# Patient Record
Sex: Female | Born: 1955
Health system: Southern US, Community
[De-identification: ages and names within clinical notes are randomized; demographics above are authoritative.]

## PROBLEM LIST (undated history)

## (undated) DIAGNOSIS — E1169 Type 2 diabetes mellitus with other specified complication: Secondary | ICD-10-CM

## (undated) DIAGNOSIS — K219 Gastro-esophageal reflux disease without esophagitis: Secondary | ICD-10-CM

## (undated) DIAGNOSIS — M1990 Unspecified osteoarthritis, unspecified site: Secondary | ICD-10-CM

## (undated) DIAGNOSIS — M810 Age-related osteoporosis without current pathological fracture: Secondary | ICD-10-CM

## (undated) DIAGNOSIS — I739 Peripheral vascular disease, unspecified: Secondary | ICD-10-CM

## (undated) DIAGNOSIS — K7581 Nonalcoholic steatohepatitis (NASH): Secondary | ICD-10-CM

## (undated) DIAGNOSIS — F32A Depression, unspecified: Secondary | ICD-10-CM

## (undated) DIAGNOSIS — I700 Atherosclerosis of aorta: Secondary | ICD-10-CM

## (undated) DIAGNOSIS — C210 Malignant neoplasm of anus, unspecified: Secondary | ICD-10-CM

## (undated) DIAGNOSIS — I10 Essential (primary) hypertension: Secondary | ICD-10-CM

## (undated) DIAGNOSIS — N816 Rectocele: Secondary | ICD-10-CM

## (undated) DIAGNOSIS — E119 Type 2 diabetes mellitus without complications: Secondary | ICD-10-CM

## (undated) DIAGNOSIS — F419 Anxiety disorder, unspecified: Secondary | ICD-10-CM

## (undated) DIAGNOSIS — Z87442 Personal history of urinary calculi: Secondary | ICD-10-CM

## (undated) DIAGNOSIS — M26629 Arthralgia of temporomandibular joint, unspecified side: Secondary | ICD-10-CM

## (undated) DIAGNOSIS — E213 Hyperparathyroidism, unspecified: Secondary | ICD-10-CM

## (undated) DIAGNOSIS — H903 Sensorineural hearing loss, bilateral: Secondary | ICD-10-CM

## (undated) HISTORY — DX: Hyperparathyroidism, unspecified: E21.3

## (undated) HISTORY — DX: Unspecified osteoarthritis, unspecified site: M19.90

## (undated) HISTORY — DX: Essential (primary) hypertension: I10

## (undated) HISTORY — DX: Arthralgia of temporomandibular joint, unspecified side: M26.629

## (undated) HISTORY — DX: Type 2 diabetes mellitus without complications: E11.9

## (undated) HISTORY — DX: Rectocele: N81.6

## (undated) HISTORY — DX: Atherosclerosis of aorta: I70.0

## (undated) HISTORY — DX: Nonalcoholic steatohepatitis (NASH): K75.81

## (undated) HISTORY — PX: THERAPEUTIC ABORTION: SHX798

## (undated) HISTORY — DX: Age-related osteoporosis without current pathological fracture: M81.0

## (undated) HISTORY — DX: Malignant neoplasm of anus, unspecified: C21.0

## (undated) HISTORY — DX: Sensorineural hearing loss, bilateral: H90.3

## (undated) HISTORY — DX: Hyperlipidemia, unspecified: E11.69

## (undated) HISTORY — PX: LYMPH NODE BIOPSY: SHX201

---

## 1998-03-03 ENCOUNTER — Other Ambulatory Visit: Admission: RE | Admit: 1998-03-03 | Discharge: 1998-03-03 | Payer: Self-pay | Admitting: Obstetrics and Gynecology

## 1998-11-05 HISTORY — PX: PARATHYROIDECTOMY: SHX19

## 1999-01-30 ENCOUNTER — Encounter: Payer: Self-pay | Admitting: Family Medicine

## 1999-01-30 ENCOUNTER — Ambulatory Visit (HOSPITAL_COMMUNITY): Admission: RE | Admit: 1999-01-30 | Discharge: 1999-01-30 | Payer: Self-pay | Admitting: Family Medicine

## 1999-03-06 ENCOUNTER — Other Ambulatory Visit: Admission: RE | Admit: 1999-03-06 | Discharge: 1999-03-06 | Payer: Self-pay | Admitting: Obstetrics and Gynecology

## 1999-07-11 ENCOUNTER — Ambulatory Visit (HOSPITAL_COMMUNITY): Admission: RE | Admit: 1999-07-11 | Discharge: 1999-07-11 | Payer: Self-pay

## 2000-03-12 ENCOUNTER — Other Ambulatory Visit: Admission: RE | Admit: 2000-03-12 | Discharge: 2000-03-12 | Payer: Self-pay | Admitting: Obstetrics and Gynecology

## 2001-03-25 ENCOUNTER — Other Ambulatory Visit: Admission: RE | Admit: 2001-03-25 | Discharge: 2001-03-25 | Payer: Self-pay | Admitting: Obstetrics and Gynecology

## 2002-01-19 ENCOUNTER — Ambulatory Visit (HOSPITAL_COMMUNITY): Admission: RE | Admit: 2002-01-19 | Discharge: 2002-01-19 | Payer: Self-pay | Admitting: Family Medicine

## 2002-01-19 ENCOUNTER — Encounter: Payer: Self-pay | Admitting: Family Medicine

## 2002-03-25 ENCOUNTER — Other Ambulatory Visit: Admission: RE | Admit: 2002-03-25 | Discharge: 2002-03-25 | Payer: Self-pay | Admitting: Obstetrics and Gynecology

## 2003-03-29 ENCOUNTER — Other Ambulatory Visit: Admission: RE | Admit: 2003-03-29 | Discharge: 2003-03-29 | Payer: Self-pay | Admitting: Obstetrics and Gynecology

## 2004-04-05 ENCOUNTER — Other Ambulatory Visit: Admission: RE | Admit: 2004-04-05 | Discharge: 2004-04-05 | Payer: Self-pay | Admitting: Obstetrics and Gynecology

## 2005-04-06 ENCOUNTER — Other Ambulatory Visit: Admission: RE | Admit: 2005-04-06 | Discharge: 2005-04-06 | Payer: Self-pay | Admitting: Addiction Medicine

## 2006-04-16 ENCOUNTER — Other Ambulatory Visit: Admission: RE | Admit: 2006-04-16 | Discharge: 2006-04-16 | Payer: Self-pay | Admitting: Obstetrics and Gynecology

## 2007-04-18 ENCOUNTER — Other Ambulatory Visit: Admission: RE | Admit: 2007-04-18 | Discharge: 2007-04-18 | Payer: Self-pay | Admitting: Obstetrics and Gynecology

## 2008-04-20 ENCOUNTER — Other Ambulatory Visit: Admission: RE | Admit: 2008-04-20 | Discharge: 2008-04-20 | Payer: Self-pay | Admitting: Obstetrics and Gynecology

## 2009-09-01 ENCOUNTER — Ambulatory Visit: Payer: Self-pay | Admitting: Obstetrics and Gynecology

## 2009-09-01 ENCOUNTER — Encounter: Payer: Self-pay | Admitting: Obstetrics and Gynecology

## 2009-09-01 ENCOUNTER — Other Ambulatory Visit: Admission: RE | Admit: 2009-09-01 | Discharge: 2009-09-01 | Payer: Self-pay | Admitting: Obstetrics and Gynecology

## 2010-11-05 HISTORY — PX: VULVA /PERINEUM BIOPSY: SHX319

## 2013-03-11 ENCOUNTER — Encounter: Payer: Self-pay | Admitting: Obstetrics & Gynecology

## 2013-03-12 ENCOUNTER — Ambulatory Visit (INDEPENDENT_AMBULATORY_CARE_PROVIDER_SITE_OTHER): Payer: Managed Care, Other (non HMO) | Admitting: Obstetrics & Gynecology

## 2013-03-12 ENCOUNTER — Encounter: Payer: Self-pay | Admitting: Obstetrics & Gynecology

## 2013-03-12 VITALS — BP 138/80 | Ht 61.75 in | Wt 168.0 lb

## 2013-03-12 DIAGNOSIS — Z0000 Encounter for general adult medical examination without abnormal findings: Secondary | ICD-10-CM

## 2013-03-12 DIAGNOSIS — Z01419 Encounter for gynecological examination (general) (routine) without abnormal findings: Secondary | ICD-10-CM

## 2013-03-12 DIAGNOSIS — N6459 Other signs and symptoms in breast: Secondary | ICD-10-CM

## 2013-03-12 LAB — POCT URINALYSIS DIPSTICK
Bilirubin, UA: NEGATIVE
Glucose, UA: NEGATIVE
Ketones, UA: NEGATIVE
Nitrite, UA: NEGATIVE
Protein, UA: NEGATIVE
Urobilinogen, UA: NEGATIVE
pH, UA: 5

## 2013-03-12 MED ORDER — VALACYCLOVIR HCL 500 MG PO TABS: 500.0000 mg | ORAL_TABLET | Freq: Two times a day (BID) | ORAL | Status: DC

## 2013-03-12 MED ORDER — CLOBETASOL PROPIONATE 0.05 % EX OINT: TOPICAL_OINTMENT | Freq: Two times a day (BID) | CUTANEOUS | Status: DC

## 2013-03-12 MED ORDER — ESTRADIOL 0.1 MG/GM VA CREA: TOPICAL_CREAM | VAGINAL | Status: DC

## 2013-03-12 NOTE — Patient Instructions (Signed)

## 2013-03-12 NOTE — Progress Notes (Signed)
57 y.o. [id number] MarriedCaucasianF here for annual exam.  Doing well.  Has had two outbreaks of herpes.  Has not had colonoscopy. Willing to go.  Complaint of significant vaginal dryness--affecting sexual activity with husband.  Would like suggestions.   Patient's last menstrual period was 11/05/2002.          Sexually active: yes  The current method of family planning is vasectomy.    Exercising: no  not regularly Smoker:  no  Health Maintenance: Pap:  02/13/12 WNL/negative HR HPV MMG:  06/16/12 normal Colonoscopy:  none BMD:   5/12-osteoporsis TDaP:  Up to date Labs: 1/14 with PCP, all normal URINE: wbc +2, rbc +1   HGB:13.6   reports that she has never smoked. She has never used smokeless tobacco. She reports that  drinks alcohol. She reports that she does not use illicit drugs.  Past Medical History  Diagnosis Date  . Hyperparathyroidism     resolved after surgery  . Osteoporosis     of the spine  . Hypertension   . Rectocele   . TMJ syndrome     Past Surgical History  Procedure Laterality Date  . Vulva /perineum biopsy  3/12  . Parathyroidectomy      Current Outpatient Prescriptions  Medication Sig Dispense Refill  . aspirin 81 MG tablet Take 81 mg by mouth daily.      Marland Kitchen atenolol (TENORMIN) 50 MG tablet Take 50 mg by mouth daily.      . Calcium Carbonate-Vitamin D (CALCIUM + D PO) Take 500 mg by mouth daily.      . Cholecalciferol (VITAMIN D PO) Take 5,000 Int'l Units by mouth daily.      . clobetasol ointment (TEMOVATE) 0.05 % Apply topically. Apply BID up to & days with flare up      . Omega-3 Fatty Acids (FISH OIL PO) Take 300 mg by mouth daily.      . vitamin C (ASCORBIC ACID) 500 MG tablet Take 500 mg by mouth daily.       No current facility-administered medications for this visit.    Family History  Problem Relation Age of Onset  . Cancer Mother     uterine  . Breast cancer Maternal Aunt   . Breast cancer Paternal Aunt   . Breast cancer Paternal Aunt      ROS:  Pertinent items are noted in HPI.  Otherwise, a comprehensive ROS was negative.  Exam:   BP 138/80  Ht 5' 1.75" (1.568 m)  Wt 168 lb (76.204 kg)  BMI 30.99 kg/m2  LMP 11/05/2002   Height: 5' 1.75" (156.8 cm)  Ht Readings from Last 3 Encounters:  03/12/13 5' 1.75" (1.568 m)    General appearance: alert, cooperative and appears stated age Head: Normocephalic, without obvious abnormality, atraumatic Neck: no adenopathy, supple, symmetrical, trachea midline and thyroid normal to inspection and palpation Lungs: clear to auscultation bilaterally Breasts: normal appearance, no masses or tenderness, on right but on left there is thickening above areola around 12 o'clock Heart: regular rate and rhythm Abdomen: soft, non-tender; bowel sounds normal; no masses,  no organomegaly Extremities: extremities normal, atraumatic, no cyanosis or edema Skin: Skin color, texture, turgor normal. No rashes or lesions Lymph nodes: Cervical, supraclavicular, and axillary nodes normal. No abnormal inguinal nodes palpated Neurologic: Grossly normal   Pelvic: External genitalia:  no lesions              Urethra:  normal appearing urethra with no masses, tenderness or  lesions              Bartholins and Skenes: normal                 Vagina: normal appearing vagina with normal color and discharge, no lesions, atrophic and erythematous               Cervix: no lesions              Pap taken: no Bimanual Exam:  Uterus:  normal size, contour, position, consistency, mobility, non-tender              Adnexa: no mass, fullness, tenderness               Rectovaginal: Confirms               Anus:  normal sphincter tone, no lesions  A:  Well Woman with normal exam PMP, no HRT Vaginal atrophic changes Hypertension HSV LS&A  P:   Mammogram yearly.  Diagnostic left MMG for findings today. Valtrex 500mg  qd for suppression or bid x 3 days with outbreaks.  Pt will start with using for outbreaks but if  has another one this year, will use as suppression.  Will call if needs RX. Clobetasol rx given for prn use Trial of estrace cream.  Pt will give update 6-8 weeks. No Pap today.  Neg with neg HR HPV 4/13 BMD next year return annually or prn  An After Visit Summary was printed and given to the patient.

## 2013-03-13 LAB — HEMOGLOBIN, FINGERSTICK: Hemoglobin, fingerstick: 13.6 g/dL (ref 12.0–16.0)

## 2013-03-19 ENCOUNTER — Telehealth: Payer: Self-pay | Admitting: Orthopedic Surgery

## 2013-03-19 NOTE — Telephone Encounter (Signed)
LM on pt's VM about appt with Dr. Loreta Ave for consultation 03-24-13 at 3:15. Phone [phone] if need to reschedule.

## 2013-04-03 ENCOUNTER — Telehealth: Payer: Self-pay | Admitting: *Deleted

## 2013-04-03 NOTE — Telephone Encounter (Signed)
LEFT MESSAGE ON HOME # OF NEED TO RETURN CALL FOR MAMMOGRAM RESULTS PER DR. MILLER. PLACED IN RECALL TO RECHECK BREAST PER DR. MILLER. SUE

## 2013-05-27 ENCOUNTER — Other Ambulatory Visit: Payer: Self-pay | Admitting: Obstetrics & Gynecology

## 2013-05-27 MED ORDER — ESTRADIOL 0.1 MG/GM VA CREA: TOPICAL_CREAM | VAGINAL | Status: DC

## 2013-05-27 NOTE — Telephone Encounter (Signed)
Pt states dryness is better and she is noticing improvement.  Pt was out of town and missed one week of therapy.  Pt would like RX for ESTRACE CREAM.

## 2013-05-27 NOTE — Telephone Encounter (Signed)
Patient was given samples of Estrace and it has helped . If she wants me to continue using this, she needs to call in Prescription. Please .

## 2013-11-05 DIAGNOSIS — E119 Type 2 diabetes mellitus without complications: Secondary | ICD-10-CM

## 2013-11-05 HISTORY — DX: Type 2 diabetes mellitus without complications: E11.9

## 2014-01-14 ENCOUNTER — Telehealth: Payer: Self-pay

## 2014-01-14 NOTE — Telephone Encounter (Signed)
lmtcb to discuss BMD//kn

## 2014-03-16 ENCOUNTER — Ambulatory Visit: Payer: Managed Care, Other (non HMO) | Admitting: Obstetrics & Gynecology

## 2014-04-22 ENCOUNTER — Ambulatory Visit (INDEPENDENT_AMBULATORY_CARE_PROVIDER_SITE_OTHER): Payer: Managed Care, Other (non HMO) | Admitting: Obstetrics & Gynecology

## 2014-04-22 ENCOUNTER — Encounter: Payer: Self-pay | Admitting: Obstetrics & Gynecology

## 2014-04-22 VITALS — BP 122/76 | HR 68 | Resp 16 | Ht 61.75 in | Wt 166.0 lb

## 2014-04-22 DIAGNOSIS — Z01419 Encounter for gynecological examination (general) (routine) without abnormal findings: Secondary | ICD-10-CM

## 2014-04-22 DIAGNOSIS — Z0000 Encounter for general adult medical examination without abnormal findings: Secondary | ICD-10-CM

## 2014-04-22 DIAGNOSIS — Z124 Encounter for screening for malignant neoplasm of cervix: Secondary | ICD-10-CM

## 2014-04-22 DIAGNOSIS — Z1211 Encounter for screening for malignant neoplasm of colon: Secondary | ICD-10-CM

## 2014-04-22 LAB — POCT URINALYSIS DIPSTICK
BILIRUBIN UA: NEGATIVE
GLUCOSE UA: NEGATIVE
KETONES UA: NEGATIVE
NITRITE UA: NEGATIVE
PH UA: 5
Protein, UA: NEGATIVE
Urobilinogen, UA: NEGATIVE

## 2014-04-22 LAB — HEMOGLOBIN, FINGERSTICK: Hemoglobin, fingerstick: 14.1 g/dL (ref 12.0–16.0)

## 2014-04-22 MED ORDER — CLOBETASOL PROPIONATE 0.05 % EX OINT: TOPICAL_OINTMENT | CUTANEOUS | Status: DC

## 2014-04-22 MED ORDER — VALACYCLOVIR HCL 500 MG PO TABS: 500.0000 mg | ORAL_TABLET | Freq: Two times a day (BID) | ORAL | Status: DC

## 2014-04-22 MED ORDER — ESTRADIOL 0.1 MG/GM VA CREA: TOPICAL_CREAM | VAGINAL | Status: DC

## 2014-04-22 NOTE — Progress Notes (Signed)
Patient ID: Angelica Wright, female   DOB: [birth year], 57 y.o.   MRN: [id number]  58 y.o. [id number] MarriedCaucasianF here for annual exam.  Reviewed BMD with pt today.  Osteoporosis in spine with 2/15 BMD.  T score -2.6.  Was -2.7 5/12.  Hips with small amount of change <1% and 3% but still mild osteopenia.    No vaginal bleeding.  Having some yellowish discharge.  Tried estrace cream last year.  Had some benefit.  Would like to start.    Last blood work was in January.  Diagnosed with "pre-diabetic" at that time.  Has follow up at the end of the summer.   Patient's last menstrual period was 11/05/2002.          Sexually active: yes  The current method of family planning is post menopausal status.    Exercising: yes  pt exercises when able, depending on knees Smoker:  no  Health Maintenance: Pap:  02/13/12, WNL, neg HR HPV History of abnormal Pap:  no MMG:  07/28/13, benign--follow up from May MMG.  Back on routine screening now.   Colonoscopy:  none BMD:   12/15/13, Osteoporosis TDaP:  UTD Screening Labs: 2015 with PCP Hb today: 14.1, Urine today: WBC-1+, RBC-1+   reports that she has never smoked. She has never used smokeless tobacco. She reports that she drinks alcohol. She reports that she does not use illicit drugs.  Past Medical History  Diagnosis Date  . Hyperparathyroidism     resolved after surgery  . Osteoporosis     of the spine  . Hypertension   . Rectocele   . TMJ syndrome     Past Surgical History  Procedure Laterality Date  . Vulva /perineum biopsy  3/12  . Parathyroidectomy    . Therapeutic abortion      x 2    Current Outpatient Prescriptions  Medication Sig Dispense Refill  . aspirin 81 MG tablet Take 81 mg by mouth daily.      Marland Kitchen atenolol (TENORMIN) 50 MG tablet Take 50 mg by mouth daily.      . Calcium Carbonate-Vitamin D (CALCIUM + D PO) Take 500 mg by mouth daily.      . Cholecalciferol (VITAMIN D PO) Take 5,000 Int'l Units by mouth daily.      .  clobetasol ointment (TEMOVATE) 0.05 % Apply topically 2 (two) times daily. Apply BID up to & days with flare up  60 g  2  . estradiol (ESTRACE) 0.1 MG/GM vaginal cream 1/2 gram pv twice weekly  42.5 g  0  . Omega-3 Fatty Acids (FISH OIL PO) Take 300 mg by mouth daily.      . valACYclovir (VALTREX) 500 MG tablet Take 1 tablet (500 mg total) by mouth 2 (two) times daily. Take for 3 days with flares.  If decides to start suppression--one tablet daily  30 tablet  2  . vitamin C (ASCORBIC ACID) 500 MG tablet Take 500 mg by mouth daily.       No current facility-administered medications for this visit.    Family History  Problem Relation Age of Onset  . Cancer Mother     uterine  . Breast cancer Maternal Aunt   . Breast cancer Paternal Aunt   . Breast cancer Paternal Aunt     ROS:  Pertinent items are noted in HPI.  Otherwise, a comprehensive ROS was negative.  Exam:   LMP 11/05/2002    Ht Readings from Last 3 Encounters:  03/12/13 5' 1.75" (1.568 m)    General appearance: alert, cooperative and appears stated age Head: Normocephalic, without obvious abnormality, atraumatic Neck: no adenopathy, supple, symmetrical, trachea midline and thyroid normal to inspection and palpation Lungs: clear to auscultation bilaterally Breasts: normal appearance, no masses or tenderness Heart: regular rate and rhythm Abdomen: soft, non-tender; bowel sounds normal; no masses,  no organomegaly Extremities: extremities normal, atraumatic, no cyanosis or edema Skin: Skin color, texture, turgor normal. No rashes or lesions Lymph nodes: Cervical, supraclavicular, and axillary nodes normal. No abnormal inguinal nodes palpated Neurologic: Grossly normal   Pelvic: External genitalia:  no lesions              Urethra:  normal appearing urethra with no masses, tenderness or lesions              Bartholins and Skenes: normal                 Vagina: normal appearing vagina with normal color and discharge, no  lesions              Cervix: no lesions              Pap taken: yes Bimanual Exam:  Uterus:  normal size, contour, position, consistency, mobility, non-tender              Adnexa: normal adnexa and no mass, fullness, tenderness               Rectovaginal: Confirms               Anus:  normal sphincter tone, no lesions  A:  Well Woman with normal exam  PMP, no HRT  Osteopenia Vaginal atrophic changes  Hypertension  HSV  LS&A  P:   Mammogram yearly.  Pt already has appt scheduled in September. Valtrex 500mg  qd for suppression or bid x 3 days with outbreaks. Pt will start with using for outbreaks but if has another one this year, will use as suppression.  Rx to pharmacy. Clobetasol rx given for prn use  Continue/restart Estrace cream 1gm pv up to twice weekly.  Rx to pharmacy. Pap today. Neg with neg HR HPV 4/13  Repeat BMD in 3-5 years.  Needs colon cancer screening.  Declines colonoscopy.  IFOB given. return annually or prn   An After Visit Summary was printed and given to the patient.

## 2014-04-22 NOTE — Telephone Encounter (Signed)
Patient notified of BMD results.//kn 

## 2014-04-28 NOTE — Addendum Note (Signed)
Addended by: Megan Salon on: 04/28/2014 10:51 AM   Modules accepted: Orders

## 2014-04-30 LAB — IPS PAP SMEAR ONLY

## 2014-05-17 ENCOUNTER — Encounter: Payer: Self-pay | Admitting: Obstetrics & Gynecology

## 2014-05-17 NOTE — Progress Notes (Signed)
Pt has not return ifob kit.  Letter sent.

## 2014-09-06 ENCOUNTER — Encounter: Payer: Self-pay | Admitting: Obstetrics & Gynecology

## 2015-05-06 ENCOUNTER — Ambulatory Visit: Payer: Managed Care, Other (non HMO) | Admitting: Obstetrics & Gynecology

## 2015-07-06 ENCOUNTER — Ambulatory Visit (INDEPENDENT_AMBULATORY_CARE_PROVIDER_SITE_OTHER): Payer: Managed Care, Other (non HMO) | Admitting: Nurse Practitioner

## 2015-07-06 ENCOUNTER — Encounter: Payer: Self-pay | Admitting: Nurse Practitioner

## 2015-07-06 VITALS — BP 110/74 | HR 68 | Ht 61.25 in | Wt 166.0 lb

## 2015-07-06 DIAGNOSIS — N904 Leukoplakia of vulva: Secondary | ICD-10-CM

## 2015-07-06 DIAGNOSIS — Z1211 Encounter for screening for malignant neoplasm of colon: Secondary | ICD-10-CM | POA: Diagnosis not present

## 2015-07-06 DIAGNOSIS — M8580 Other specified disorders of bone density and structure, unspecified site: Secondary | ICD-10-CM

## 2015-07-06 DIAGNOSIS — Z01419 Encounter for gynecological examination (general) (routine) without abnormal findings: Secondary | ICD-10-CM

## 2015-07-06 DIAGNOSIS — N952 Postmenopausal atrophic vaginitis: Secondary | ICD-10-CM | POA: Insufficient documentation

## 2015-07-06 DIAGNOSIS — I10 Essential (primary) hypertension: Secondary | ICD-10-CM | POA: Diagnosis not present

## 2015-07-06 DIAGNOSIS — Z0000 Encounter for general adult medical examination without abnormal findings: Secondary | ICD-10-CM

## 2015-07-06 MED ORDER — VALACYCLOVIR HCL 500 MG PO TABS: 500.0000 mg | ORAL_TABLET | Freq: Two times a day (BID) | ORAL | Status: DC

## 2015-07-06 MED ORDER — ESTRADIOL 10 MCG VA TABS: 1.0000 | ORAL_TABLET | VAGINAL | Status: DC

## 2015-07-06 MED ORDER — CLOBETASOL PROPIONATE 0.05 % EX OINT: TOPICAL_OINTMENT | CUTANEOUS | Status: DC

## 2015-07-06 NOTE — Progress Notes (Signed)
Encounter reviewed by Dr. Brook Amundson C. Silva.  

## 2015-07-06 NOTE — Patient Instructions (Signed)

## 2015-07-06 NOTE — Progress Notes (Signed)
Patient ID: Angelica Wright, female   DOB: 05/15/1956, 59 y.o.   MRN: [id number] 59 y.o. [id number] Married  Caucasian Fe here for annual exam.  Currently off vaginal estrogen for about a year. No an increase in dryness and dyspareunia. She wishes to try another type of estrogen.   Ok to try Vagifem.  Patient's last menstrual period was 11/05/2002 (approximate).          Sexually active: Yes.    The current method of family planning is vasectomy and post menopausal status.    Exercising: No.  The patient does not participate in regular exercise at present. Smoker:  no  Health Maintenance: Pap: 04/28/14, Negative  History of abnormal Pap: no MMG: 07/29/14, Bi-Rads 2:  Benign Findings Colonoscopy: none BMD: 12/15/13, -2.6 S / -1.3 R / -1.5 L TDaP: UTD Labs:  Dr. Kenton Kingfisher takes care of labs and urine.   reports that she has never smoked. She has never used smokeless tobacco. She reports that she drinks about 3.5 oz of alcohol per week. She reports that she does not use illicit drugs.  Past Medical History  Diagnosis Date  . Hyperparathyroidism     resolved after surgery  . Osteoporosis     of the spine  . Hypertension   . Rectocele   . TMJ syndrome   . Prediabetes 1/15    by PCP    Past Surgical History  Procedure Laterality Date  . Vulva /perineum biopsy  3/12  . Parathyroidectomy    . Therapeutic abortion      x 2    Current Outpatient Prescriptions  Medication Sig Dispense Refill  . aspirin 81 MG tablet Take 81 mg by mouth daily.     Marland Kitchen atenolol (TENORMIN) 50 MG tablet Take 50 mg by mouth daily.    . Calcium Carbonate-Vitamin D (CALCIUM + D PO) Take 500 mg by mouth daily.    . Cholecalciferol (VITAMIN D PO) Take 5,000 Int'l Units by mouth daily.    . clobetasol ointment (TEMOVATE) 0.05 % Apply BID up to & days with flare up 60 g 2  . Omega-3 Fatty Acids (FISH OIL PO) Take 300 mg by mouth daily.    . valACYclovir (VALTREX) 500 MG tablet Take 1 tablet (500 mg total) by  mouth 2 (two) times daily. Take for 3 days with flares.  If decides to start suppression--one tablet daily 90 tablet 3  . vitamin C (ASCORBIC ACID) 500 MG tablet Take 500 mg by mouth daily.    . Estradiol 10 MCG TABS vaginal tablet Place 1 tablet (10 mcg total) vaginally 2 (two) times a week. 8 tablet 11   No current facility-administered medications for this visit.    Family History  Problem Relation Age of Onset  . Cancer Mother     uterine  . Breast cancer Maternal Aunt   . Breast cancer Paternal Aunt   . Breast cancer Paternal Aunt     ROS:  Pertinent items are noted in HPI.  Otherwise, a comprehensive ROS was negative.  Exam:   BP 110/74 mmHg  Pulse 68  Ht 5' 1.25" (1.556 m)  Wt 166 lb (75.297 kg)  BMI 31.10 kg/m2  LMP 11/05/2002 (Approximate) Height: 5' 1.25" (155.6 cm) Ht Readings from Last 3 Encounters:  07/06/15 5' 1.25" (1.556 m)  04/22/14 5' 1.75" (1.568 m)  03/12/13 5' 1.75" (1.568 m)    General appearance: alert, cooperative and appears stated age Head: Normocephalic, without obvious abnormality, atraumatic  Neck: no adenopathy, supple, symmetrical, trachea midline and thyroid normal to inspection and palpation Lungs: clear to auscultation bilaterally Breasts: normal appearance, no masses or tenderness Heart: regular rate and rhythm Abdomen: soft, non-tender; no masses,  no organomegaly Extremities: extremities normal, atraumatic, no cyanosis or edema Skin: Skin color, texture, turgor normal. No rashes or lesions Lymph nodes: Cervical, supraclavicular, and axillary nodes normal. No abnormal inguinal nodes palpated Neurologic: Grossly normal   Pelvic: External genitalia:  no lesions, no flare of LSA              Urethra:  normal appearing urethra with no masses, tenderness or lesions              Bartholin's and Skene's: normal                 Vagina: atrophic appearing vagina with normal color and discharge, no lesions              Cervix: anteverted               Pap taken: No. Bimanual Exam:  Uterus:  normal size, contour, position, consistency, mobility, non-tender              Adnexa: no mass, fullness, tenderness               Rectovaginal: Confirms               Anus:  normal sphincter tone, no lesions  Chaperone present: no  A:  Well Woman with normal exam  Postmenopausal no HRT   Osteopenia  Vaginal atrophic changes with dyspareunia  Hypertension   HSV - suppressive therapy  LSA without current flare   P:   Reviewed health and wellness pertinent to exam  Pap smear as above  Mammogram is due 07/2015  Rx given for Vagifem 10 mcg daily for 7-10 days then twice a week X 1 year  Counseled with risk of DVT, CVA, cancer, etc.  She does not need a refill on Temovate at this time  Refill on Valtrex for a year  She has agreed on a referral for colonoscopy  Counseled on breast self exam, mammography screening, use and side effects of HRT, osteoporosis, adequate intake of calcium and vitamin D, diet and exercise, Kegel's exercises return annually or prn  An After Visit Summary was printed and given to the patient.

## 2015-07-18 ENCOUNTER — Telehealth: Payer: Self-pay | Admitting: Nurse Practitioner

## 2015-07-18 NOTE — Telephone Encounter (Signed)
Patient calling in regards to vagifem refill. Ms. Chong Sicilian wrote sent in a rx for Vagifem, patient says she was supposed to insert the vagifem daily for 2 weeks and the twice a week there after. Patient says the rx was not written correctly and she has run out, needs our office to do a override. Pharmacy: [facility] Best contact: Home # [phone] ; Cell [phone]

## 2015-07-18 NOTE — Telephone Encounter (Signed)
Angelica Boroughs, FNP patient was given Vagifem 10 mcg with instructions reading place one tablet vaginally twice per week #[id number]. Patient states she was advised to place one tablet daily for 2 weeks and then twice a week for one year. Patient was given 8 tablets and is now out. Pharmacy will not fill rx until the following month based on how it is written with her insurance. Angelica Boroughs, FNP please verify prescription directions. Patient will need new rx.

## 2015-07-19 ENCOUNTER — Other Ambulatory Visit: Payer: Self-pay | Admitting: Nurse Practitioner

## 2015-07-19 MED ORDER — ESTRADIOL 10 MCG VA TABS: 1.0000 | ORAL_TABLET | VAGINAL | Status: DC

## 2015-07-19 MED ORDER — ESTRADIOL 10 MCG VA TABS: ORAL_TABLET | VAGINAL | Status: DC

## 2015-07-19 NOTE — Progress Notes (Signed)
Prescription resent electronically with excess instruction removed.

## 2015-07-19 NOTE — Addendum Note (Signed)
Addended by: Graylon Good on: 07/19/2015 01:47 PM   Modules accepted: Orders

## 2015-07-19 NOTE — Telephone Encounter (Signed)
The medication is now entered correctly - my error in not picking the right sig. Sorry.

## 2015-07-19 NOTE — Telephone Encounter (Signed)
Spoke with patient. Advised new rx for Vagifem 10 mcg has been sent to pharmacy with new instructions and amount. Advised this should allow her to pick up her rx now that the changes have been made. Advised if she has any difficulty getting her prescription filled to give our office a call.  Routing to provider for final review. Patient agreeable to disposition. Will close encounter.

## 2015-07-21 ENCOUNTER — Telehealth: Payer: Self-pay | Admitting: Nurse Practitioner

## 2015-07-21 NOTE — Telephone Encounter (Signed)
Spoke with patient regarding referral appointment. The information is listed below. The patient is aware if she needs to cancel or reschedule this appointment, she may call the office they've been referred to in order to reschedule. Gave below information to patient: ok to close encounter  [facility] Dr Collene Mares [address] [phone]  08/04/15 @2pm . Arrive 145 with insurance card/photo id.

## 2016-08-06 ENCOUNTER — Ambulatory Visit: Payer: Managed Care, Other (non HMO) | Admitting: Nurse Practitioner

## 2016-08-28 ENCOUNTER — Encounter: Payer: Self-pay | Admitting: Nurse Practitioner

## 2016-08-28 ENCOUNTER — Ambulatory Visit (INDEPENDENT_AMBULATORY_CARE_PROVIDER_SITE_OTHER): Payer: Managed Care, Other (non HMO) | Admitting: Nurse Practitioner

## 2016-08-28 ENCOUNTER — Encounter: Payer: Self-pay | Admitting: Obstetrics & Gynecology

## 2016-08-28 VITALS — BP 140/86 | HR 94 | Resp 16 | Ht 61.0 in | Wt 167.0 lb

## 2016-08-28 DIAGNOSIS — Z01419 Encounter for gynecological examination (general) (routine) without abnormal findings: Secondary | ICD-10-CM

## 2016-08-28 DIAGNOSIS — N8189 Other female genital prolapse: Secondary | ICD-10-CM

## 2016-08-28 DIAGNOSIS — Z0000 Encounter for general adult medical examination without abnormal findings: Secondary | ICD-10-CM | POA: Diagnosis not present

## 2016-08-28 DIAGNOSIS — Z1211 Encounter for screening for malignant neoplasm of colon: Secondary | ICD-10-CM | POA: Diagnosis not present

## 2016-08-28 DIAGNOSIS — N904 Leukoplakia of vulva: Secondary | ICD-10-CM | POA: Diagnosis not present

## 2016-08-28 DIAGNOSIS — N952 Postmenopausal atrophic vaginitis: Secondary | ICD-10-CM

## 2016-08-28 MED ORDER — ESTRADIOL 10 MCG VA TABS: ORAL_TABLET | VAGINAL | 12 refills | Status: DC

## 2016-08-28 MED ORDER — CLOBETASOL PROPIONATE 0.05 % EX OINT: TOPICAL_OINTMENT | CUTANEOUS | 2 refills | Status: DC

## 2016-08-28 MED ORDER — VALACYCLOVIR HCL 500 MG PO TABS: 500.0000 mg | ORAL_TABLET | Freq: Two times a day (BID) | ORAL | 3 refills | Status: DC

## 2016-08-28 NOTE — Progress Notes (Signed)
60 y.o. [id number] Married  Caucasian Fe here for annual exam.  Moved mother her from [place] and she is now in an apartment.  No new diagnosis.  Patient's last menstrual period was 11/05/2002 (approximate).          Sexually active: Yes.    The current method of family planning is post menopausal status.    Exercising: No.  The patient does not participate in regular exercise at present. Smoker:  no  Health Maintenance: Pap: 04/28/14 Negative MMG: 08/02/16, Bi-Rads 2:  Benign Colonoscopy: declines -  IFOB is given BMD:   08/02/16 T Score: -2.60 Spine / -2.00 Right Femur Neck / -2.20 Left Femur Neck TDaP:  UTD with PCP Shingles: Not indicated due to age Pneumonia: Not indicated due to age Hep C and HIV: Done in September with PCP Labs: PCP   reports that she has never smoked. She has never used smokeless tobacco. She reports that she drinks about 3.5 oz of alcohol per week . She reports that she does not use drugs.  Past Medical History:  Diagnosis Date  . Hyperparathyroidism    resolved after surgery  . Hypertension   . Osteoporosis    of the spine  . Prediabetes 1/15   by PCP  . Rectocele   . TMJ syndrome     Past Surgical History:  Procedure Laterality Date  . PARATHYROIDECTOMY    . THERAPEUTIC ABORTION     x 2  . VULVA /PERINEUM BIOPSY  3/12    Current Outpatient Prescriptions  Medication Sig Dispense Refill  . aspirin 81 MG tablet Take 81 mg by mouth daily.     Marland Kitchen atenolol (TENORMIN) 50 MG tablet Take 50 mg by mouth daily.    . Calcium Carbonate-Vitamin D (CALCIUM + D PO) Take 500 mg by mouth daily.    . Cholecalciferol (VITAMIN D PO) Take 5,000 Int'l Units by mouth daily.    . clobetasol ointment (TEMOVATE) 0.05 % Apply BID up to & days with flare up 60 g 2  . Estradiol (VAGIFEM) 10 MCG TABS vaginal tablet Use nightly before bed for two weeks when first beginning this medicine,then begin using twice a week. 18 tablet 12  . Omega-3 Fatty Acids (FISH OIL PO) Take 300 mg by  mouth daily.    . valACYclovir (VALTREX) 500 MG tablet Take 1 tablet (500 mg total) by mouth 2 (two) times daily. Take for 3 days with flares.  If decides to start suppression--one tablet daily 90 tablet 3  . vitamin C (ASCORBIC ACID) 500 MG tablet Take 500 mg by mouth daily.     No current facility-administered medications for this visit.     Family History  Problem Relation Age of Onset  . Cancer Mother     uterine  . Breast cancer Maternal Aunt   . Breast cancer Paternal Aunt   . Breast cancer Paternal Aunt     ROS:  Pertinent items are noted in HPI.  Otherwise, a comprehensive ROS was negative.  Exam:   BP 140/86 (BP Location: Right Arm, Patient Position: Sitting, Cuff Size: Normal)   Pulse 94   Resp 16   Ht 5\' 1"  (1.549 m)   Wt 167 lb (75.8 kg)   LMP 11/05/2002 (Approximate)   BMI 31.55 kg/m  Height: 5\' 1"  (154.9 cm) Ht Readings from Last 3 Encounters:  08/28/16 5\' 1"  (1.549 m)  07/06/15 5' 1.25" (1.556 m)  04/22/14 5' 1.75" (1.568 m)    General  appearance: alert, cooperative and appears stated age Head: Normocephalic, without obvious abnormality, atraumatic Neck: no adenopathy, supple, symmetrical, trachea midline and thyroid normal to inspection and palpation Lungs: clear to auscultation bilaterally Breasts: normal appearance, no masses or tenderness Heart: regular rate and rhythm Abdomen: soft, non-tender; no masses,  no organomegaly Extremities: extremities normal, atraumatic, no cyanosis or edema Skin: Skin color, texture, turgor normal. No rashes or lesions Lymph nodes: Cervical, supraclavicular, and axillary nodes normal. No abnormal inguinal nodes palpated Neurologic: Grossly normal   Pelvic: External genitalia:  no lesions              Urethra:  normal appearing urethra with no masses, tenderness or lesions              Bartholin's and Skene's: normal                 Vagina: atrophic appearing vagina with normal color and discharge, no lesions with  pelvic floor relaxation              Cervix: anteverted              Pap taken: No. Bimanual Exam:  Uterus:  normal size, contour, position, consistency, mobility, non-tender              Adnexa: no mass, fullness, tenderness               Rectovaginal: Confirms               Anus:  normal sphincter tone, no lesions  Chaperone present: yes  A:  Well Woman with normal exam  Postmenopausal no HRT              Osteopenia             Vaginal atrophic changes with dyspareunia, pelvic floor relaxation             Hypertension              HSV - suppressive therapy             LSA with current flare   P:   Reviewed health and wellness pertinent to exam  Pap smear as above  Mammogram is due 9/18  Refill Vagifem - generic to see if insurance will cover  Counseled about risk of DVT, CVA, cancer, etc.  Refill on Temovate for flare of LSA  Refill on Valtrex to use prn  IFOB is given  Will get referral to Urology for pelvic floor strengthening  Counseled on breast self exam, mammography screening, use and side effects of HRT, adequate intake of calcium and vitamin D, diet and exercise, Kegel's exercises return annually or prn  An After Visit Summary was printed and given to the patient.

## 2016-08-28 NOTE — Patient Instructions (Signed)

## 2016-08-29 ENCOUNTER — Encounter: Payer: Self-pay | Admitting: Nurse Practitioner

## 2016-08-31 ENCOUNTER — Encounter: Payer: Self-pay | Admitting: Nurse Practitioner

## 2016-08-31 NOTE — Progress Notes (Signed)
Encounter reviewed by Dr. Nephtali Docken Amundson C. Silva.  

## 2016-09-03 ENCOUNTER — Other Ambulatory Visit: Payer: Self-pay | Admitting: Nurse Practitioner

## 2016-09-03 MED ORDER — ESTRADIOL 10 MCG VA TABS: ORAL_TABLET | VAGINAL | 12 refills | Status: DC

## 2016-09-07 ENCOUNTER — Telehealth: Payer: Self-pay | Admitting: Nurse Practitioner

## 2016-09-07 NOTE — Telephone Encounter (Signed)
Call to patient to discuss next steps in referral for pelvic physical therapy to [facility]. Left voicemail to return call.

## 2016-11-08 ENCOUNTER — Encounter: Payer: Self-pay | Admitting: *Deleted

## 2016-11-08 NOTE — Progress Notes (Signed)
Letter sent via MyChart to return IFOB kit.  Order extended to 12/09/16. 

## 2017-01-18 DIAGNOSIS — E559 Vitamin D deficiency, unspecified: Secondary | ICD-10-CM | POA: Diagnosis not present

## 2017-01-18 DIAGNOSIS — I10 Essential (primary) hypertension: Secondary | ICD-10-CM | POA: Diagnosis not present

## 2017-01-18 DIAGNOSIS — R0789 Other chest pain: Secondary | ICD-10-CM | POA: Diagnosis not present

## 2017-01-28 DIAGNOSIS — E7800 Pure hypercholesterolemia, unspecified: Secondary | ICD-10-CM | POA: Diagnosis not present

## 2017-01-28 DIAGNOSIS — E559 Vitamin D deficiency, unspecified: Secondary | ICD-10-CM | POA: Diagnosis not present

## 2017-01-28 DIAGNOSIS — I10 Essential (primary) hypertension: Secondary | ICD-10-CM | POA: Diagnosis not present

## 2017-01-28 DIAGNOSIS — R7303 Prediabetes: Secondary | ICD-10-CM | POA: Diagnosis not present

## 2017-01-30 DIAGNOSIS — M170 Bilateral primary osteoarthritis of knee: Secondary | ICD-10-CM | POA: Diagnosis not present

## 2017-02-04 DIAGNOSIS — I10 Essential (primary) hypertension: Secondary | ICD-10-CM | POA: Diagnosis not present

## 2017-02-04 DIAGNOSIS — E785 Hyperlipidemia, unspecified: Secondary | ICD-10-CM | POA: Diagnosis not present

## 2017-02-04 DIAGNOSIS — R079 Chest pain, unspecified: Secondary | ICD-10-CM | POA: Diagnosis not present

## 2017-02-04 DIAGNOSIS — R0789 Other chest pain: Secondary | ICD-10-CM | POA: Diagnosis not present

## 2017-04-02 ENCOUNTER — Ambulatory Visit: Payer: Managed Care, Other (non HMO) | Admitting: Podiatry

## 2017-04-30 DIAGNOSIS — L2089 Other atopic dermatitis: Secondary | ICD-10-CM | POA: Diagnosis not present

## 2017-04-30 DIAGNOSIS — L821 Other seborrheic keratosis: Secondary | ICD-10-CM | POA: Diagnosis not present

## 2017-05-27 ENCOUNTER — Telehealth: Payer: Self-pay | Admitting: Obstetrics and Gynecology

## 2017-05-27 NOTE — Telephone Encounter (Signed)
Left message regarding upcoming appointment has been canceled and needs to be rescheduled. °

## 2017-07-24 DIAGNOSIS — M2550 Pain in unspecified joint: Secondary | ICD-10-CM | POA: Diagnosis not present

## 2017-07-24 DIAGNOSIS — E7800 Pure hypercholesterolemia, unspecified: Secondary | ICD-10-CM | POA: Diagnosis not present

## 2017-07-24 DIAGNOSIS — I10 Essential (primary) hypertension: Secondary | ICD-10-CM | POA: Diagnosis not present

## 2017-07-24 DIAGNOSIS — Z0000 Encounter for general adult medical examination without abnormal findings: Secondary | ICD-10-CM | POA: Diagnosis not present

## 2017-07-24 DIAGNOSIS — R7303 Prediabetes: Secondary | ICD-10-CM | POA: Diagnosis not present

## 2017-07-24 DIAGNOSIS — Z1211 Encounter for screening for malignant neoplasm of colon: Secondary | ICD-10-CM | POA: Diagnosis not present

## 2017-08-21 DIAGNOSIS — Z23 Encounter for immunization: Secondary | ICD-10-CM | POA: Diagnosis not present

## 2017-08-30 ENCOUNTER — Ambulatory Visit: Payer: Managed Care, Other (non HMO) | Admitting: Nurse Practitioner

## 2017-12-26 ENCOUNTER — Ambulatory Visit: Payer: Managed Care, Other (non HMO) | Admitting: Obstetrics and Gynecology

## 2018-01-06 DIAGNOSIS — Z803 Family history of malignant neoplasm of breast: Secondary | ICD-10-CM | POA: Diagnosis not present

## 2018-01-06 DIAGNOSIS — Z1231 Encounter for screening mammogram for malignant neoplasm of breast: Secondary | ICD-10-CM | POA: Diagnosis not present

## 2018-01-09 DIAGNOSIS — R928 Other abnormal and inconclusive findings on diagnostic imaging of breast: Secondary | ICD-10-CM | POA: Diagnosis not present

## 2018-01-09 DIAGNOSIS — N6012 Diffuse cystic mastopathy of left breast: Secondary | ICD-10-CM | POA: Diagnosis not present

## 2018-01-16 ENCOUNTER — Other Ambulatory Visit: Payer: Self-pay

## 2018-01-16 ENCOUNTER — Encounter: Payer: Self-pay | Admitting: Obstetrics and Gynecology

## 2018-01-16 ENCOUNTER — Other Ambulatory Visit (HOSPITAL_COMMUNITY)
Admission: RE | Admit: 2018-01-16 | Discharge: 2018-01-16 | Disposition: A | Discharge status: Home / Self Care | Payer: 59 | Source: Ambulatory Visit | Attending: Obstetrics and Gynecology | Admitting: Obstetrics and Gynecology

## 2018-01-16 ENCOUNTER — Ambulatory Visit: Payer: 59 | Admitting: Obstetrics and Gynecology

## 2018-01-16 VITALS — BP 150/86 | HR 80 | Resp 16 | Ht 61.25 in | Wt 162.0 lb

## 2018-01-16 DIAGNOSIS — Z803 Family history of malignant neoplasm of breast: Secondary | ICD-10-CM | POA: Insufficient documentation

## 2018-01-16 DIAGNOSIS — Z01419 Encounter for gynecological examination (general) (routine) without abnormal findings: Secondary | ICD-10-CM | POA: Diagnosis not present

## 2018-01-16 DIAGNOSIS — R350 Frequency of micturition: Secondary | ICD-10-CM | POA: Diagnosis not present

## 2018-01-16 DIAGNOSIS — R151 Fecal smearing: Secondary | ICD-10-CM

## 2018-01-16 DIAGNOSIS — R3915 Urgency of urination: Secondary | ICD-10-CM

## 2018-01-16 DIAGNOSIS — N952 Postmenopausal atrophic vaginitis: Secondary | ICD-10-CM | POA: Diagnosis not present

## 2018-01-16 DIAGNOSIS — N393 Stress incontinence (female) (male): Secondary | ICD-10-CM

## 2018-01-16 DIAGNOSIS — L900 Lichen sclerosus et atrophicus: Secondary | ICD-10-CM

## 2018-01-16 DIAGNOSIS — Z124 Encounter for screening for malignant neoplasm of cervix: Secondary | ICD-10-CM | POA: Diagnosis not present

## 2018-01-16 DIAGNOSIS — N941 Unspecified dyspareunia: Secondary | ICD-10-CM | POA: Diagnosis not present

## 2018-01-16 DIAGNOSIS — M810 Age-related osteoporosis without current pathological fracture: Secondary | ICD-10-CM | POA: Insufficient documentation

## 2018-01-16 DIAGNOSIS — I10 Essential (primary) hypertension: Secondary | ICD-10-CM | POA: Insufficient documentation

## 2018-01-16 DIAGNOSIS — N816 Rectocele: Secondary | ICD-10-CM | POA: Diagnosis not present

## 2018-01-16 LAB — POCT URINALYSIS DIPSTICK
BILIRUBIN UA: NEGATIVE
GLUCOSE UA: NEGATIVE
KETONES UA: NEGATIVE
Nitrite, UA: NEGATIVE
Protein, UA: NEGATIVE
pH, UA: 5 (ref 5.0–8.0)

## 2018-01-16 MED ORDER — VALACYCLOVIR HCL 500 MG PO TABS: ORAL_TABLET | ORAL | 1 refills | Status: DC

## 2018-01-16 MED ORDER — NONFORMULARY OR COMPOUNDED ITEM: 3 refills | Status: DC

## 2018-01-16 MED ORDER — CLOBETASOL PROPIONATE 0.05 % EX OINT: TOPICAL_OINTMENT | CUTANEOUS | 1 refills | Status: DC

## 2018-01-16 NOTE — Patient Instructions (Signed)
EXERCISE AND DIET:  We recommended that you start or continue a regular exercise program for good health. Regular exercise means any activity that makes your heart beat faster and makes you sweat.  We recommend exercising at least 30 minutes per day at least 3 days a week, preferably 4 or 5.  We also recommend a diet low in fat and sugar.  Inactivity, poor dietary choices and obesity can cause diabetes, heart attack, stroke, and kidney damage, among others.    ALCOHOL AND SMOKING:  Women should limit their alcohol intake to no more than 7 drinks/beers/glasses of wine (combined, not each!) per week. Moderation of alcohol intake to this level decreases your risk of breast cancer and liver damage. And of course, no recreational drugs are part of a healthy lifestyle.  And absolutely no smoking or even second hand smoke. Most people know smoking can cause heart and lung diseases, but did you know it also contributes to weakening of your bones? Aging of your skin?  Yellowing of your teeth and nails?  CALCIUM AND VITAMIN D:  Adequate intake of calcium and Vitamin D are recommended.  The recommendations for exact amounts of these supplements seem to change often, but generally speaking 600 mg of calcium (either carbonate or citrate) and 800 units of Vitamin D per day seems prudent. Certain women may benefit from higher intake of Vitamin D.  If you are among these women, your doctor will have told you during your visit.    PAP SMEARS:  Pap smears, to check for cervical cancer or precancers,  have traditionally been done yearly, although recent scientific advances have shown that most women can have pap smears less often.  However, every woman still should have a physical exam from her gynecologist every year. It will include a breast check, inspection of the vulva and vagina to check for abnormal growths or skin changes, a visual exam of the cervix, and then an exam to evaluate the size and shape of the uterus and  ovaries.  And after 61 years of age, a rectal exam is indicated to check for rectal cancers. We will also provide age appropriate advice regarding health maintenance, like when you should have certain vaccines, screening for sexually transmitted diseases, bone density testing, colonoscopy, mammograms, etc.   MAMMOGRAMS:  All women over 40 years old should have a yearly mammogram. Many facilities now offer a "3D" mammogram, which may cost around $50 extra out of pocket. If possible,  we recommend you accept the option to have the 3D mammogram performed.  It both reduces the number of women who will be called back for extra views which then turn out to be normal, and it is better than the routine mammogram at detecting truly abnormal areas.    COLONOSCOPY:  Colonoscopy to screen for colon cancer is recommended for all women at age 50.  We know, you hate the idea of the prep.  We agree, BUT, having colon cancer and not knowing it is worse!!  Colon cancer so often starts as a polyp that can be seen and removed at colonscopy, which can quite literally save your life!  And if your first colonoscopy is normal and you have no family history of colon cancer, most women don't have to have it again for 10 years.  Once every ten years, you can do something that may end up saving your life, right?  We will be happy to help you get it scheduled when you are ready.    Be sure to check your insurance coverage so you understand how much it will cost.  It may be covered as a preventative service at no cost, but you should check your particular policy.       Overactive Bladder, Adult Overactive bladder is a group of urinary symptoms. With overactive bladder, you may suddenly feel the need to pass urine (urinate) right away. After feeling this sudden urge, you might also leak urine if you cannot get to the bathroom fast enough (urinary incontinence). These symptoms might interfere with your daily work or social activities.  Overactive bladder symptoms may also wake you up at night. Overactive bladder affects the nerve signals between your bladder and your brain. Your bladder may get the signal to empty before it is full. Very sensitive muscles can also make your bladder squeeze too soon. What are the causes? Many things can cause an overactive bladder. Possible causes include:  Urinary tract infection.  Infection of nearby tissues, such as the prostate.  Prostate enlargement.  Being pregnant with twins or more (multiples).  Surgery on the uterus or urethra.  Bladder stones, inflammation, or tumors.  Drinking too much caffeine or alcohol.  Certain medicines, especially those that you take to help your body get rid of extra fluid (diuretics) by increasing urine production.  Muscle or nerve weakness, especially from: ? A spinal cord injury. ? Stroke. ? Multiple sclerosis. ? Parkinson disease.  Diabetes. This can cause a high urine volume that fills the bladder so quickly that the normal urge to urinate is triggered very strongly.  Constipation. A buildup of too much stool can put pressure on your bladder.  What increases the risk? You may be at greater risk for overactive bladder if you:  Are an older adult.  Smoke.  Are going through menopause.  Have prostate problems.  Have a neurological disease, such as stroke, dementia, Parkinson disease, or multiple sclerosis (MS).  Eat or drink things that irritate the bladder. These include alcohol, spicy food, and caffeine.  Are overweight or obese.  What are the signs or symptoms? The signs and symptoms of an overactive bladder include:  Sudden, strong urges to urinate.  Leaking urine.  Urinating eight or more times per day.  Waking up to urinate two or more times per night.  How is this diagnosed? Your health care provider may suspect overactive bladder based on your symptoms. The health care provider will do a physical exam and take  your medical history. Blood or urine tests may also be done. For example, you might need to have a bladder function test to check how well you can hold your urine. You might also need to see a health care provider who specializes in the urinary tract (urologist). How is this treated? Treatment for overactive bladder depends on the cause of your condition and whether it is mild or severe. Certain treatments can be done in your health care provider's office or clinic. You can also make lifestyle changes at home. Options include: Behavioral Treatments  Biofeedback. A specialist uses sensors to help you become aware of your body's signals.  Keeping a daily log of when you need to urinate and what happens after the urge. This may help you manage your condition.  Bladder training. This helps you learn to control the urge to urinate by following a schedule that directs you to urinate at regular intervals (timed voiding). At first, you might have to wait a few minutes after feeling the urge. In  time, you should be able to schedule bathroom visits an hour or more apart.  Kegel exercises. These are exercises to strengthen the pelvic floor muscles, which support the bladder. Toning these muscles can help you control urination, even if your bladder muscles are overactive. A specialist will teach you how to do these exercises correctly. They require daily practice.  Weight loss. If you are obese or overweight, losing weight might relieve your symptoms of overactive bladder. Talk to your health care provider about losing weight and whether there is a specific program or method that would work best for you.  Diet change. This might help if constipation is making your overactive bladder worse. Your health care provider or a dietitian can explain ways to change what you eat to ease constipation. You might also need to consume less alcohol and caffeine or drink other fluids at different times of the day.  Stopping  smoking.  Wearing pads to absorb leakage while you wait for other treatments to take effect. Physical Treatments  Electrical stimulation. Electrodes send gentle pulses of electricity to strengthen the nerves or muscles that help to control the bladder. Sometimes, the electrodes are placed outside of the body. In other cases, they might be placed inside the body (implanted). This treatment can take several months to have an effect.  Supportive devices. Women may need a plastic device that fits into the vagina and supports the bladder (pessary). Medicines Several medicines can help treat overactive bladder and are usually used along with other treatments. Some are injected into the muscles involved in urination. Others come in pill form. Your health care provider may prescribe:  Antispasmodics. These medicines block the signals that the nerves send to the bladder. This keeps the bladder from releasing urine at the wrong time.  Tricyclic antidepressants. These types of antidepressants also relax bladder muscles.  Surgery  You may have a device implanted to help manage the nerve signals that indicate when you need to urinate.  You may have surgery to implant electrodes for electrical stimulation.  Sometimes, very severe cases of overactive bladder require surgery to change the shape of the bladder. Follow these instructions at home:  Take medicines only as directed by your health care provider.  Use any implants or a pessary as directed by your health care provider.  Make any diet or lifestyle changes that are recommended by your health care provider. These might include: ? Drinking less fluid or drinking at different times of the day. If you need to urinate often during the night, you may need to stop drinking fluids early in the evening. ? Cutting down on caffeine or alcohol. Both can make an overactive bladder worse. Caffeine is found in coffee, tea, and sodas. ? Doing Kegel exercises  to strengthen muscles. ? Losing weight if you need to. ? Eating a healthy and balanced diet to prevent constipation.  Keep a journal or log to track how much and when you drink and also when you feel the need to urinate. This will help your health care provider to monitor your condition. Contact a health care provider if:  Your symptoms do not get better after treatment.  Your pain and discomfort are getting worse.  You have more frequent urges to urinate.  You have a fever. Get help right away if: You are not able to control your bladder at all. This information is not intended to replace advice given to you by your health care provider. Make sure you discuss any  questions you have with your health care provider. Document Released: 08/18/2009 Document Revised: 03/29/2016 Document Reviewed: 03/17/2014 Elsevier Interactive Patient Education  2018 [place]. Fecal Incontinence Fecal incontinence, also called accidental bowel leakage, is not being able to control your bowels. This condition happens because the nerves or muscles around the anus do not work the way they should. This affects their ability to hold stool. What are the causes? This condition may be caused by:  Damage to the muscles at the end of the rectum (sphincter).  Damage to the nerves that control bowel movements.  Diarrhea.  Chronic constipation.  Pelvic floor dysfunction. This means the muscles in the pelvis do not work well.  Loss of bowel storage capacity.  What increases the risk? This condition is more likely to develop in people who:  Are born with bowels or a pelvis that has not formed correctly.  Have had rectal surgery.  Have had radiation treatment for certain cancers.  Have irritable bowel syndrome (IBS).  Have an inflammatory bowel disease (IBD), such as Crohn disease.  Have been pregnant, had a vaginal delivery, or had surgery that damaged the pelvic floor muscles.  Have a complicated  childbirth, spinal cord injury, or other trauma that causes nerve damage.  Have a condition that can affect nerve function, such as diabetes, Parkinson disease, or multiple sclerosis.  Have a condition where the rectum drops down into the anus or vagina (prolapse).  Are older.  What are the signs or symptoms? The main symptom of this condition is not being able to control your bowels. You also might not be able get to the bathroom before a bowel movement. How is this diagnosed? This condition is diagnosed with a medical history and physical exam. You may also have tests, including:  Blood tests.  Urine tests.  A rectal exam.  Ultrasound.  MRI.  Colonoscopy. This is an exam that looks at your large intestine (colon).  Anal manometry. This is a test that measures the strength of the anal sphincter.  Anal electromyogram (EMG). This is a test that uses small electrodes to check for nerve damage.  How is this treated? Treatment varies depending on the cause and severity of your condition. Treatment may also focus on addressing any underlying causes of this condition. Treatment may include:  Medicines. This may include medicines to: ? Prevent diarrhea. ? Help with constipation (laxatives). ? Treat any underlying conditions.  Physical therapy.  Fiber supplements. These can help manage your bowel movements.  Nerve stimulation.  Injectable gel to promote tissue growth and better muscle control.  Surgery. You may need: ? Sphincter repair surgery. ? Diversion surgery. This procedure lets feces pass out of your body through a hole in your abdomen.  Follow these instructions at home: Diet  Follow instructions from your health care provider about any eating or drinking restrictions. Work with a dietitian to come up with a healthy diet and to help you avoid the foods that can make your condition worse. Keep a diet diary to find out which foods or drinks could be making your fecal  incontinence worse.  Drink enough fluid to keep your urine clear or pale yellow. Lifestyle  If you smoke, talk to your health care provider about quitting. This may help your condition.  If you are overweight, talk to your health care provider about how to safely lose weight. This may help your condition.  Increase your physical activity as told by your health care provider. This may  help your condition. Always talk to your health care provider before starting a new exercise program.  Carry a change of clothes and supplies to clean up quickly if you have an episode of fetal incontinence.  Consider joining a fecal incontinence support group. You can find a support group online or in your local community. General instructions  Take over-the-counter and prescription medicines only as told by your health care provider. This includes any supplements.  Apply a moisture barrier, such as petroleum jelly, to your rectum. This protects the skin from irritation caused by ongoing leaking or diarrhea.  Tell your health care provider if you are upset or depressed about your condition. Where to find more information: American Academy of [employer] Physicians: www.AromatherapyParty.no International Foundation for Functional Gastrointestinal Disorders: www.iffgd.org Contact a health care provider if:  You have a fever.  You have redness, swelling, or pain around your rectum.  Your pain is getting worse or you lose feeling in your rectal area.  You have blood in your stool.  You feel sad or hopeless.  You avoid social or work situations. Get help right away if:  You stop having bowel movements.  You cannot eat or drink without vomiting.  You have rectal bleeding that does not stop.  You have severe pain that is getting worse.  You have symptoms of dehydration, including: ? Sleepiness or fatigue. ? Producing little or no urine, tears, or sweat. ? Dizziness. ? Dry mouth. ? Unusual  irritability. ? Headache. ? Inability to think clearly. This information is not intended to replace advice given to you by your health care provider. Make sure you discuss any questions you have with your health care provider. Document Released: 10/03/2004 Document Revised: 03/29/2016 Document Reviewed: 03/30/2015 Elsevier Interactive Patient Education  [employer].

## 2018-01-16 NOTE — Progress Notes (Signed)
62 y.o. [id number] MarriedCaucasianF here for annual exam.   She has a h/o lichen sclerosis, she uses steroid ointment intermittently. She over used steroid ointment in the peri-anal area (as directed) and has issues with decreased sensation around her anus. She has some fecal urgency, only rare leakage of loose stool.  In the last couple of years she is having one bowel movement a day (with use of magnesium). She has diarrhea if she eats fiber.  She has long term issues with urinary frequency and urgency. Voids normal amounts, drinks lots of water. 1.5-2 cups of coffee a day. She has some intermittent GSI, small amounts. Occasional urge incontinence with a full bladder. She leaks 5 out of 7 days, mainly GSI, not needing a pad.  She has a rectocele, no trouble emptying her bowels. No baseline discomfort.  No vaginal bleeding. Sexually active without penetration, vagina is too sore. She has tried vaginal estrogen, too expensive.     Patient's last menstrual period was 11/05/2002 (approximate).          Sexually active: Yes.    The current method of family planning is vasectomy.    Exercising: No.  The patient does not participate in regular exercise at present. Smoker:  no  Health Maintenance: Pap:  04-28-14 WNL  History of abnormal Pap:  no MMG:  08-02-16 WNL, just had it one week ago and returned for a call back diagnostic. Told okay.  Colonoscopy:  Never BMD:   08-02-16 Osteoporosis, declines treatment TDaP:  2012 Gardasil: N/A   reports that  has never smoked. she has never used smokeless tobacco. She reports that she drinks about 3.5 oz of alcohol per week. She reports that she does not use drugs. Working as an Glass blower/designer. 2 grown kids, 5 grand children, all local.   Past Medical History:  Diagnosis Date  . Hyperparathyroidism ([place])    resolved after surgery  . Hypertension   . Osteoporosis    of the spine  . Prediabetes 1/15   by PCP  . Rectocele   . TMJ syndrome     Past  Surgical History:  Procedure Laterality Date  . PARATHYROIDECTOMY    . THERAPEUTIC ABORTION     x 2  . VULVA /PERINEUM BIOPSY  3/12    Current Outpatient Medications  Medication Sig Dispense Refill  . aspirin 81 MG tablet Take 81 mg by mouth daily.     Marland Kitchen atenolol (TENORMIN) 50 MG tablet Take 50 mg by mouth daily.    Marland Kitchen b complex vitamins capsule Take 1 capsule by mouth daily.    . Cholecalciferol (VITAMIN D PO) Take 5,000 Int'l Units by mouth daily.    . clobetasol ointment (TEMOVATE) 0.05 % Apply BID up to & days with flare up 60 g 2  . Coenzyme Q10 (CO Q 10) 100 MG CAPS Take 1 tablet by mouth daily.    . hydrochlorothiazide (HYDRODIURIL) 12.5 MG tablet Take 1 tablet by mouth daily.    . Magnesium 400 MG TABS Take 1 tablet by mouth daily.    . Omega-3 Fatty Acids (FISH OIL PO) Take 300 mg by mouth daily.    . valACYclovir (VALTREX) 500 MG tablet Take 1 tablet (500 mg total) by mouth 2 (two) times daily. Take for 3 days with flares.  If decides to start suppression--one tablet daily 90 tablet 3  . vitamin C (ASCORBIC ACID) 250 MG tablet Take 250 mg by mouth daily.    . vitamin E  1000 UNIT capsule Take 1,000 Units by mouth 2 (two) times daily.     No current facility-administered medications for this visit.     Family History  Problem Relation Age of Onset  . Cancer Mother        uterine  . Breast cancer Mother 35       lumpectomy  . Breast cancer Maternal Aunt   . Breast cancer Paternal Aunt   . Breast cancer Paternal Aunt   . Cirrhosis Father   Mom had uterine cancer in her late 62's, breast cancer around 61. MA breast cancer in her 34's. Maternal cousin breast cancer in her early 28's. 2 Paternal aunts with breast cancer  Review of Systems  Constitutional: Negative.   HENT: Negative.   Eyes: Negative.   Respiratory: Negative.   Cardiovascular: Negative.   Gastrointestinal: Negative.   Endocrine: Negative.   Genitourinary: Positive for dyspareunia, frequency and urgency.        Vulvar itching Loss of sexual interest  Loss of urine with sneeze/cough   Musculoskeletal: Negative.   Skin: Positive for rash.  Allergic/Immunologic: Negative.   Neurological: Negative.   Psychiatric/Behavioral: Negative.     Exam:   BP (!) 150/86 (BP Location: Right Arm, Patient Position: Sitting, Cuff Size: Normal)   Pulse 80   Resp 16   Ht 5' 1.25" (1.556 m)   Wt 162 lb (73.5 kg)   LMP 11/05/2002 (Approximate)   BMI 30.36 kg/m   Weight change: @WEIGHTCHANGE @ Height:   Height: 5' 1.25" (155.6 cm)  Ht Readings from Last 3 Encounters:  01/16/18 5' 1.25" (1.556 m)  08/28/16 5\' 1"  (1.549 m)  07/06/15 5' 1.25" (1.556 m)    General appearance: alert, cooperative and appears stated age Head: Normocephalic, without obvious abnormality, atraumatic Neck: no adenopathy, supple, symmetrical, trachea midline and thyroid normal to inspection and palpation Lungs: clear to auscultation bilaterally Cardiovascular: regular rate and rhythm Breasts: normal appearance, no masses or tenderness Abdomen: soft, non-tender; non distended,  no masses,  no organomegaly Extremities: extremities normal, atraumatic, no cyanosis or edema Skin: Skin color, texture, turgor normal. No rashes or lesions Lymph nodes: Cervical, supraclavicular, and axillary nodes normal. No abnormal inguinal nodes palpated Neurologic: Grossly normal   Pelvic: External genitalia:  no lesions, mild whitening on the right inner labia majora, loss of architecture on the left labia minora.               Urethra:  normal appearing urethra with no masses, tenderness or lesions              Bartholins and Skenes: normal                 Vagina: atrophic appearing vagina with normal color and discharge, no lesions. Small grade 2 rectocele              Cervix: no lesions               Bimanual Exam:  Uterus:  normal size, contour, position, consistency, mobility, non-tender              Adnexa: no mass, fullness, tenderness                Rectovaginal: Confirms               Anus:  normal sphincter tone, whitening in the left peri-anal area with fissure  Chaperone was present for exam.  A:  Well Woman with normal exam  Fecal staining  OAB symptoms  GSI  Family history of breast cancer  Osteoporosis with primary in 8/00  Lichen sclerosis, intermittent steroid ointment  Rectocele, mild not symptomatic  Vaginal atrophy  Dyspareunia  P:   Pap with hpv  Get copy of her mammogram  Discussed colonoscopy, declines, desires cologuard  Discussed breast self exam  Discussed calcium and vit D intake  Referral to genetics  Discussed metamucil for fecal incontinence  Referral to PT  Discussed decreasing her caffeine intake  Will call in compounded vaginal estrogen  Discussed vulvar skin care, use Vaseline baseline  In addition to her annual exam well over 10 minutes was spent counseling the patient on her bowel and bladder issues.

## 2018-01-17 ENCOUNTER — Encounter: Payer: Self-pay | Admitting: Genetics

## 2018-01-17 ENCOUNTER — Telehealth: Payer: Self-pay | Admitting: Genetics

## 2018-01-17 LAB — URINALYSIS, MICROSCOPIC ONLY
BACTERIA UA: NONE SEEN
CASTS: NONE SEEN /LPF

## 2018-01-17 LAB — URINE CULTURE: Organism ID, Bacteria: NO GROWTH

## 2018-01-17 NOTE — Telephone Encounter (Signed)
A genetic counseling appt has been scheduled for the pt to see Link Snuffer on 4/29 at 3pm. Letter mailed.

## 2018-01-20 ENCOUNTER — Other Ambulatory Visit: Payer: Self-pay | Admitting: Obstetrics and Gynecology

## 2018-01-20 DIAGNOSIS — R3129 Other microscopic hematuria: Secondary | ICD-10-CM

## 2018-01-20 LAB — CYTOLOGY - PAP
DIAGNOSIS: NEGATIVE
HPV: NOT DETECTED

## 2018-01-30 DIAGNOSIS — N3946 Mixed incontinence: Secondary | ICD-10-CM | POA: Diagnosis not present

## 2018-01-30 DIAGNOSIS — M6289 Other specified disorders of muscle: Secondary | ICD-10-CM | POA: Diagnosis not present

## 2018-01-30 DIAGNOSIS — M6281 Muscle weakness (generalized): Secondary | ICD-10-CM | POA: Diagnosis not present

## 2018-02-05 DIAGNOSIS — R7303 Prediabetes: Secondary | ICD-10-CM | POA: Diagnosis not present

## 2018-02-05 DIAGNOSIS — I10 Essential (primary) hypertension: Secondary | ICD-10-CM | POA: Diagnosis not present

## 2018-02-05 DIAGNOSIS — E7800 Pure hypercholesterolemia, unspecified: Secondary | ICD-10-CM | POA: Diagnosis not present

## 2018-02-07 DIAGNOSIS — R7303 Prediabetes: Secondary | ICD-10-CM | POA: Diagnosis not present

## 2018-02-07 DIAGNOSIS — I10 Essential (primary) hypertension: Secondary | ICD-10-CM | POA: Diagnosis not present

## 2018-03-03 ENCOUNTER — Encounter: Payer: Self-pay | Admitting: Genetics

## 2018-03-03 ENCOUNTER — Other Ambulatory Visit: Payer: Self-pay

## 2018-03-03 ENCOUNTER — Inpatient Hospital Stay: Payer: 59

## 2018-03-03 ENCOUNTER — Inpatient Hospital Stay: Payer: 59 | Attending: Genetic Counselor | Admitting: Genetics

## 2018-03-03 DIAGNOSIS — Z808 Family history of malignant neoplasm of other organs or systems: Secondary | ICD-10-CM | POA: Diagnosis not present

## 2018-03-03 DIAGNOSIS — Z803 Family history of malignant neoplasm of breast: Secondary | ICD-10-CM | POA: Diagnosis not present

## 2018-03-03 DIAGNOSIS — Z8049 Family history of malignant neoplasm of other genital organs: Secondary | ICD-10-CM | POA: Insufficient documentation

## 2018-03-03 DIAGNOSIS — Z7183 Encounter for nonprocreative genetic counseling: Secondary | ICD-10-CM

## 2018-03-03 NOTE — Progress Notes (Signed)
REFERRING PROVIDER: Salvadore Dom, [address], [place] [address]  PRIMARY PROVIDER:  Shirline Frees, MD  PRIMARY REASON FOR VISIT:  1. Family history of breast cancer   2. Family history of uterine cancer     HISTORY OF PRESENT ILLNESS:   Angelica Wright, a 62 y.o. female, was seen for a [facility] cancer genetics consultation at the request of Dr. Talbert Nan due to a family history of cancer.  Angelica Wright presents to clinic today to discuss the possibility of a hereditary predisposition to cancer, genetic testing, and to further clarify her future cancer risks, as well as potential cancer risks for family members.   Angelica Wright is a 62 y.o. female with no personal history of cancer.    HORMONAL RISK FACTORS:  Menarche was at age 6.  First live birth at age 16.  Ovaries intact: yes.  Hysterectomy: no.  Menopausal status: postmenopausal.  HRT use: 10 years. Colonoscopy: no; not examined. Mammogram within the last year: yes. Number of breast biopsies: 0.  Past Medical History:  Diagnosis Date  . Family history of breast cancer   . Family history of uterine cancer   . Hyperparathyroidism ([place])    resolved after surgery  . Hypertension   . Osteoporosis    of the spine  . Prediabetes 1/15   by PCP  . Rectocele   . TMJ syndrome     Past Surgical History:  Procedure Laterality Date  . PARATHYROIDECTOMY    . THERAPEUTIC ABORTION     x 2  . VULVA /PERINEUM BIOPSY  3/12    Social History   Socioeconomic History  . Marital status: Married    Spouse name: Not on file  . Number of children: Not on file  . Years of education: Not on file  . Highest education level: Not on file  Occupational History  . Not on file  Social Needs  . Financial resource strain: Not on file  . Food insecurity:    Worry: Not on file    Inability: Not on file  . Transportation needs:    Medical: Not on file    Non-medical: Not on file  Tobacco Use  . Smoking  status: Never Smoker  . Smokeless tobacco: Never Used  Substance and Sexual Activity  . Alcohol use: Yes    Alcohol/week: 3.5 oz    Types: 7 Standard drinks or equivalent per week    Comment: 1 glass of wine/night  . Drug use: No  . Sexual activity: Yes    Partners: Male    Birth control/protection: Other-see comments    Comment: vasectomy  Lifestyle  . Physical activity:    Days per week: Not on file    Minutes per session: Not on file  . Stress: Not on file  Relationships  . Social connections:    Talks on phone: Not on file    Gets together: Not on file    Attends religious service: Not on file    Active member of club or organization: Not on file    Attends meetings of clubs or organizations: Not on file    Relationship status: Not on file  Other Topics Concern  . Not on file  Social History Narrative  . Not on file     FAMILY HISTORY:  We obtained a detailed, 4-generation family history.  Significant diagnoses are listed below: Family History  Problem Relation Age of Onset  . Cancer Mother 42  uterine  . Breast cancer Mother 5       lumpectomy, x2 in other breast in late 74's  . Breast cancer Maternal Aunt        dx in early 10's, x2 in late 80's  . Breast cancer Paternal Aunt 79  . Breast cancer Paternal Aunt 85  . Cirrhosis Father   . Liver cancer Father   . Stroke Maternal Grandmother   . Stroke Maternal Grandfather   . Emphysema Paternal Grandmother   . Diabetes Paternal Grandfather   . Breast cancer Cousin 45   Angelica Wright has 2 sons ages 65 and 88 with no history of caner.  Angelica Wright has 5 grandchildren.  Angelica Wright has a brother who his 75 with no history of cancer.   Angelica Wright father: died at 25 due to liver cancer.  Paternal Aunts/Uncles: 4 paternal aunts, 3 paternal uncles.  2 paternal aunts had breast cancer (on in her 65's and the other in her 23's).   Paternal cousins: no history of cancer, limited information about these relatives.    Paternal grandfather: died in his 51's due to Emphysema Paternal grandmother:died in her 57's due to diabetes.   Angelica Wright mother: died in her 76's due to breast cancer first dx at 24, then again in contralateral breast in later 67's.  She also had a history of uterine cancer in her 30's.  Maternal Aunts/Uncles: 1 maternal aunt had breast cancer twice.  First diagnosis was in early 41's, 2nd time in her late 68's, died at 66.  Maternal cousins: 1 maternal cousin had breast cancer in her late 65's.  Maternal grandfather: died due to strokes at 21.  Maternal grandmother:died at 40, stroke.   Angelica Wright is unaware of previous family history of genetic testing for hereditary cancer risks. Patient's maternal ancestors are of Greek/Northern European descent, and paternal ancestors are of [place] descent. There is no reported Ashkenazi Jewish ancestry. There is no known consanguinity.  GENETIC COUNSELING ASSESSMENT: Angelica Wright is a 62 y.o. female with a family history which is somewhat suggestive of a Hereditary Cancer Predisposition Syndrome. We, therefore, discussed and recommended the following at today's visit.   DISCUSSION: We reviewed the characteristics, features and inheritance patterns of hereditary cancer syndromes. We also discussed genetic testing, including the appropriate family members to test, the process of testing, insurance coverage and turn-around-time for results. We discussed the implications of a negative, positive and/or variant of uncertain significant result. We recommended Angelica Wright pursue genetic testing for the Common Hereditary Cancers gene panel. The Common Hereditary Cancer Panel offered by [facility] includes sequencing and/or deletion duplication testing of the following 47 genes: APC, ATM, AXIN2, BARD1, BMPR1A, BRCA1, BRCA2, BRIP1, CDH1, CDKN2A (p14ARF), CDKN2A (p16INK4a), CKD4, CHEK2, CTNNA1, DICER1, EPCAM (Deletion/duplication testing only), GREM1 (promoter region  deletion/duplication testing only), KIT, MEN1, MLH1, MSH2, MSH3, MSH6, MUTYH, NBN, NF1, NHTL1, PALB2, PDGFRA, PMS2, POLD1, POLE, PTEN, RAD50, RAD51C, RAD51D, SDHB, SDHC, SDHD, SMAD4, SMARCA4. STK11, TP53, TSC1, TSC2, and VHL.  The following genes were evaluated for sequence changes only: SDHA and HOXB13 c.251G>A variant only.  We discussed that only 5-10% of cancers are associated with a Hereditary cancer predisposition syndrome.  One of the most common hereditary cancer syndromes that increases breast cancer risk is called Hereditary Breast and Ovarian Cancer (HBOC) syndrome.  This syndrome is caused by mutations in the BRCA1 and BRCA2 genes.  This syndrome increases an individual's lifetime risk to develop breast, ovarian, pancreatic, and other  types of cancer.  There are also many other cancer predisposition syndromes caused by mutations in several other genes.  We discussed that if she is found to have a mutation in one of these genes, it may impact future medical management recommendations such as increased cancer screenings and consideration of risk reducing surgeries.  A positive result could also have implications for the patient's family members.  A Negative result would mean we were unable to identify hereditary predisoposition cancer, but does not rule out the possibility of a hereditary basis for her family's cancer.  There could be mutations that are undetectable by current technology, or in genes not yet tested or identified to increase cancer risk.    We discussed the potential to find a Variant of Uncertain Significance or VUS.  These are variants that have not yet been identified as pathogenic or benign, and it is unknown if this variant is associated with increased cancer risk or if this is a normal finding.  Most VUS's are reclassified to benign or likely benign.   It should not be used to make medical management decisions. With time, we suspect the lab will determine the significance of  any VUS's identified if any.   Based on Angelica Wright's family history of cancer, she meets medical criteria for genetic testing. Despite that she meets criteria, she may still have an out of pocket cost. We discussed that if her out of pocket cost for testing is over $100, the laboratory will call and confirm whether she wants to proceed with testing.  If the out of pocket cost of testing is less than $100 she will be billed by the genetic testing laboratory.   Based on the patient's personal and family history, the statistical model (Tyrer Cusik)   Was used to estimate her risk of developing breast cancer. This estimates her lifetime risk of developing breast cancer to be approximately 11.4%. This estimation is in the setting of negative genetic testing, a positive result may impact this risk assessment. The patient's lifetime breast cancer risk is a preliminary estimate based on available information using one of several models endorsed by the [place] (ACS). The ACS recommends consideration of breast MRI screening as an adjunct to mammography for patients at high risk (defined as 20% or greater lifetime risk). A more detailed breast cancer risk assessment can be considered, if clinically indicated.     PLAN: After considering the risks, benefits, and limitations, Angelica Wright  provided informed consent to pursue genetic testing and the blood sample was sent to [facility] for analysis of the Common Hereditary Cancers Panel. Results should be available within approximately 2-3 weeks' time, at which point they will be disclosed by telephone to Angelica Wright, as will any additional recommendations warranted by these results. Angelica Wright will receive a summary of her genetic counseling visit and a copy of her results once available. This information will also be available in Epic. We encouraged Angelica Wright to remain in contact with cancer genetics annually so that we can continuously update the  family history and inform her of any changes in cancer genetics and testing that may be of benefit for her family. Angelica Wright questions were answered to her satisfaction today. Our contact information was provided should additional questions or concerns arise.  Based on Ms. Frieden's family history, we recommended her maternal relatives (especially those affected with cancer), also have genetic counseling and testing. Ms. Scarboro will let us know if we can be  of any assistance in coordinating genetic counseling and/or testing for this family member.   Lastly, we encouraged Ms. Omar to remain in contact with cancer genetics annually so that we can continuously update the family history and inform her of any changes in cancer genetics and testing that may be of benefit for this family.   Ms.  Haffey questions were answered to her satisfaction today. Our contact information was provided should additional questions or concerns arise. Thank you for the referral and allowing [place] to share in the care of your patient.   Tana Felts, MS, [facility] Certified Genetic Counselor [email or web address] phone: [phone]  The patient was seen for a total of 35 minutes in face-to-face genetic counseling.

## 2018-03-13 ENCOUNTER — Telehealth: Payer: Self-pay | Admitting: Genetics

## 2018-03-13 NOTE — Telephone Encounter (Addendum)
Revealed negative genetic testing.  Revealed that a VUS in PMS2 was identified.   This normal result is reassuring and indicates that it is unlikely Angelica Wright's has a hereditary predisposition to cancer.    However, genetic testing is not perfect, and cannot definitively rule out a hereditary cause.  It will be important for her to keep in contact with genetics to learn if any additional testing may be needed in the future.     Her Tyrer Cusik breast calculation was 11.4%, not above 20 so we do not recommend high risk breast screening.  However, it is still important for her to have annual mammograms and follow all recommendations for cancer screening/treatment, etc provided to her by her  Physicians.   Her maternal relatives appear to also meet medical criteria for genetic testing.

## 2018-03-19 ENCOUNTER — Ambulatory Visit: Payer: Self-pay | Admitting: Genetics

## 2018-03-19 ENCOUNTER — Encounter: Payer: Self-pay | Admitting: Genetics

## 2018-03-19 DIAGNOSIS — Z8049 Family history of malignant neoplasm of other genital organs: Secondary | ICD-10-CM

## 2018-03-19 DIAGNOSIS — Z1379 Encounter for other screening for genetic and chromosomal anomalies: Secondary | ICD-10-CM | POA: Insufficient documentation

## 2018-03-19 DIAGNOSIS — Z803 Family history of malignant neoplasm of breast: Secondary | ICD-10-CM

## 2018-03-19 NOTE — Progress Notes (Signed)
HPI:  Ms. Bernardi was previously seen in the [facility] clinic on 03/03/2018 due to a family history of cancer and concerns regarding a hereditary predisposition to cancer. Please refer to our prior cancer genetics clinic note for more information regarding Ms. Froemming's medical, social and family histories, and our assessment and recommendations, at the time. Ms. Perusse recent genetic test results were disclosed to her, as well as recommendations warranted by these results. These results and recommendations are discussed in more detail below.  CANCER HISTORY:   No history exists.     FAMILY HISTORY:  We obtained a detailed, 4-generation family history.  Significant diagnoses are listed below: Family History  Problem Relation Age of Onset  . Cancer Mother 49       uterine  . Breast cancer Mother 84       lumpectomy, x2 in other breast in late 72's  . Breast cancer Maternal Aunt        dx in early 63's, x2 in late 80's  . Breast cancer Paternal Aunt 1  . Breast cancer Paternal Aunt 85  . Cirrhosis Father   . Liver cancer Father   . Stroke Maternal Grandmother   . Stroke Maternal Grandfather   . Emphysema Paternal Grandmother   . Diabetes Paternal Grandfather   . Breast cancer Cousin 55    Ms. Fosdick has 2 sons ages 56 and 55 with no history of caner.  Ms. Renegar has 5 grandchildren.  Ms. Algeo has a brother who his 29 with no history of cancer.   Ms. Koors father: died at 51 due to liver cancer.  Paternal Aunts/Uncles: 4 paternal aunts, 3 paternal uncles.  2 paternal aunts had breast cancer (on in her 12's and the other in her 47's).   Paternal cousins: no history of cancer, limited information about these relatives.  Paternal grandfather: died in his 52's due to Emphysema Paternal grandmother:died in her 38's due to diabetes.   Ms. Qadir mother: died in her 46's due to breast cancer first dx at 92, then again in contralateral breast in later 37's.  She also had a  history of uterine cancer in her 64's.  Maternal Aunts/Uncles: 1 maternal aunt had breast cancer twice.  First diagnosis was in early 73's, 2nd time in her late 35's, died at 74.  Maternal cousins: 1 maternal cousin had breast cancer in her late 36's.  Maternal grandfather: died due to strokes at 60.  Maternal grandmother:died at 51, stroke.   Ms. Boehning is unaware of previous family history of genetic testing for hereditary cancer risks. Patient's maternal ancestors are of Greek/Northern European descent, and paternal ancestors are of [place] descent. There is no reported Ashkenazi Jewish ancestry. There is no known consanguinity.  GENETIC TEST RESULTS: Genetic testing performed through [facility]'s Common Hereditary Cancers Panel reported out on 03/10/2018 showed no pathogenic mutations. The Common Hereditary Cancer Panel offered by [facility] includes sequencing and/or deletion duplication testing of the following 47 genes: APC, ATM, AXIN2, BARD1, BMPR1A, BRCA1, BRCA2, BRIP1, CDH1, CDKN2A (p14ARF), CDKN2A (p16INK4a), CKD4, CHEK2, CTNNA1, DICER1, EPCAM (Deletion/duplication testing only), GREM1 (promoter region deletion/duplication testing only), KIT, MEN1, MLH1, MSH2, MSH3, MSH6, MUTYH, NBN, NF1, NHTL1, PALB2, PDGFRA, PMS2, POLD1, POLE, PTEN, RAD50, RAD51C, RAD51D, SDHB, SDHC, SDHD, SMAD4, SMARCA4. STK11, TP53, TSC1, TSC2, and VHL.  The following genes were evaluated for sequence changes only: SDHA and HOXB13 c.251G>A variant only.  A variant of uncertain significance (VUS) in a gene called PMS2 was also  noted. c.1399G>A (p.Val467Ile)  The test report will be scanned into EPIC and will be located under the Molecular Pathology section of the Results Review tab. A portion of the result report is included below for reference.     We discussed with Ms. Chervenak that because current genetic testing is not perfect, it is possible there may be a gene mutation in one of these genes that current testing cannot  detect, but that chance is small.  We also discussed, that there could be another gene that has not yet been discovered, or that we have not yet tested, that is responsible for the cancer diagnoses in the family. It is also possible there is a hereditary cause for the cancer in the family that Ms. Sinning did not inherit and therefore was not identified in her testing.  Therefore, it is important to remain in touch with cancer genetics in the future so that we can continue to offer Ms. Eaker the most up to date genetic testing.   Regarding the VUS in PMS2: At this time, it is unknown if this variant is associated with increased cancer risk or if this is a normal finding, but most variants such as this get reclassified to being inconsequential. It should not be used to make medical management decisions. With time, we suspect the lab will determine the significance of this variant, if any. If we do learn more about it, we will try to contact Ms. Sconyers to discuss it further. However, it is important to stay in touch with [place] periodically and keep the address and phone number up to date.  ADDITIONAL GENETIC TESTING: We discussed with Ms. Granito that there are other genes that are associated with increased cancer risk that can be analyzed. The laboratories that offer this testing look at these additional genes via a hereditary cancer gene panel. Should Ms. Garramone wish to pursue additional genetic testing, we are happy to discuss and coordinate this testing, at any time.    CANCER SCREENING RECOMMENDATIONS: Ms. Tesch test result is considered negative (normal).  This means that we have not identified a hereditary predisposition to cancer in Ms. Degner. at this time.   While reassuring, this does not definitively rule out a hereditary predisposition to cancer. It is still possible that there could be genetic mutations that are undetectable by current technology, or genetic mutations in genes that have not been tested  or identified to increase cancer risk.  Therefore, it is recommended she continue to follow the cancer management and screening guidelines provided by her oncology and primary healthcare provider. An individual's cancer risk is not determined by genetic test results alone.  Overall cancer risk assessment includes additional factors such as personal medical history, family history, etc.  These should be used to make a personalized plan for cancer prevention and surveillance.    Based on the patient's personal and family history, the statistical model (Tyrer Cusik)   Was used to estimate her risk of developing breast cancer. This estimates her lifetime risk of developing breast cancer to be approximately 11.4%. This estimation is in the setting of negative genetic testing, a positive result may impact this risk assessment. The patient's lifetime breast cancer risk is a preliminary estimate based on available information using one of several models endorsed by the [place] (ACS). The ACS recommends consideration of breast MRI screening as an adjunct to mammography for patients at high risk (defined as 20% or greater lifetime risk). A more detailed breast  cancer risk assessment can be considered, if clinically indicated.     RECOMMENDATIONS FOR FAMILY MEMBERS:  Relatives in this family might be at some increased risk of developing cancer, over the general population risk, simply due to the family history of cancer.  We recommended women in this family have a yearly mammogram beginning at age 75, or 31 years younger than the earliest onset of cancer, an annual clinical breast exam, and perform monthly breast self-exams. Women in this family should also have a gynecological exam as recommended by their primary provider. All family members should have a colonoscopy by age 60 (or as directed by their doctors).  All family members should inform their physicians about the family history of cancer so  their doctors can make the most appropriate screening recommendations for them.   It is also possible there is a hereditary cause for the cancer in Ms. Lessig's family that she did not inherit and therefore was not identified in her.   We recommended maternal relatives, (especially those affected with cancer), also have genetic counseling and testing. Ms. Liszewski will let us know if we can be of any assistance in coordinating genetic counseling and/or testing for these family members.   FOLLOW-UP: Lastly, we discussed with Ms. Pendell that cancer genetics is a rapidly advancing field and it is possible that new genetic tests will be appropriate for her and/or her family members in the future. We encouraged her to remain in contact with cancer genetics on an annual basis so we can update her personal and family histories and let her know of advances in cancer genetics that may benefit this family.   Our contact number was provided. Ms. Moscoso questions were answered to her satisfaction, and she knows she is welcome to call us at anytime with additional questions or concerns.   Ferol Luz, MS, [facility] Certified Genetic Counselor [email or web address]

## 2018-03-24 ENCOUNTER — Telehealth: Payer: Self-pay | Admitting: *Deleted

## 2018-03-24 NOTE — Telephone Encounter (Signed)
Detailed message left on mobile number advising patient RN calling to follow up on cologuard, as our office received letter from [employer] stating patient had not yet completed cologuard. Advised patient to return call to give update if she still planned on completing cologuard sample.

## 2018-04-01 DIAGNOSIS — M79605 Pain in left leg: Secondary | ICD-10-CM | POA: Diagnosis not present

## 2018-04-01 DIAGNOSIS — R600 Localized edema: Secondary | ICD-10-CM | POA: Diagnosis not present

## 2018-04-01 DIAGNOSIS — M79604 Pain in right leg: Secondary | ICD-10-CM | POA: Diagnosis not present

## 2018-04-14 ENCOUNTER — Telehealth: Payer: Self-pay | Admitting: Obstetrics and Gynecology

## 2018-04-14 NOTE — Telephone Encounter (Signed)
Left message to call back -eh

## 2018-04-14 NOTE — Telephone Encounter (Signed)
Please call this patient. She had microscopic hematuria in 3/19 and a negative urine culture. She never returned for a f/u urinalysis, need to make sure she is now negative for blood. If dip is + send for micro ua.

## 2018-04-17 NOTE — Telephone Encounter (Signed)
Left another message for patient to call back -eh

## 2018-04-18 NOTE — Telephone Encounter (Signed)
Spoke with patient. Nurse visit scheduled for 04/24/2018 at 2 pm. Patient is agreeable to date and time. Encounter closed.

## 2018-04-23 NOTE — Progress Notes (Signed)
Visit from 3/19 showed rbc in urine. Pt here today for repeat urine dipstick. poct urine shows RBC Trace, WBC 2+ today. Urine sent for urine micro.   Patient states that she has no urinary problems. She did state that she was diagnosised with kidney stones in the past.  Routing to provider for review.

## 2018-04-24 ENCOUNTER — Ambulatory Visit (INDEPENDENT_AMBULATORY_CARE_PROVIDER_SITE_OTHER): Payer: 59

## 2018-04-24 VITALS — BP 162/88 | HR 68 | Resp 21 | Ht 61.25 in | Wt 171.0 lb

## 2018-04-24 DIAGNOSIS — R600 Localized edema: Secondary | ICD-10-CM | POA: Diagnosis not present

## 2018-04-24 DIAGNOSIS — M79605 Pain in left leg: Secondary | ICD-10-CM | POA: Diagnosis not present

## 2018-04-24 DIAGNOSIS — R319 Hematuria, unspecified: Secondary | ICD-10-CM | POA: Diagnosis not present

## 2018-04-24 DIAGNOSIS — M79604 Pain in right leg: Secondary | ICD-10-CM | POA: Diagnosis not present

## 2018-04-24 LAB — POCT URINALYSIS DIPSTICK
Bilirubin, UA: NEGATIVE
Glucose, UA: NEGATIVE
KETONES UA: NEGATIVE
NITRITE UA: NEGATIVE
PH UA: 5 (ref 5.0–8.0)
PROTEIN UA: NEGATIVE
UROBILINOGEN UA: NEGATIVE U/dL — AB

## 2018-04-25 ENCOUNTER — Telehealth: Payer: Self-pay

## 2018-04-25 DIAGNOSIS — R319 Hematuria, unspecified: Secondary | ICD-10-CM

## 2018-04-25 LAB — URINALYSIS, MICROSCOPIC ONLY
Bacteria, UA: NONE SEEN
CASTS: NONE SEEN /LPF

## 2018-04-25 NOTE — Telephone Encounter (Signed)
Spoke with patient. Result given. Patient verbalizes understanding. States she has seen [facility] in the past, but has been many years. Referral placed to [facility]. Aware she will be contacted by [facility] directly to schedule evaluation.  Cc: Magdalene Patricia for referral coordination and faxing all notes  Routing to provider for final review. Patient agreeable to disposition. Will close encounter.

## 2018-04-25 NOTE — Telephone Encounter (Signed)
-----   Message from Salvadore Dom, MD sent at 04/25/2018 11:50 AM EDT ----- Please let the patient know that she has persistent hematuria, she needs to f/u with Urology. If she doesn't have a urologist please set her up to see someone and send a copy of her labs.

## 2018-04-28 ENCOUNTER — Telehealth: Payer: Self-pay | Admitting: Genetics

## 2018-04-28 NOTE — Telephone Encounter (Signed)
Answered patient's question about needing cologuard if she already had genetic testing.  I explained that the genetic testing was looking for genetic increased risk that she would have been born with and increased her risk for cancer above average.  This did not find reason to suggest she is at increased risk for colon cancer.  However, we discussed anybody can still get colon cancer even if it was not hereditary- we still recommend following her doctors recommendations regarding any colon cancer screening.  The Cologuard test is actually screening for an existing cancer not genetic risk.  She said she understood the difference and just wanted to make sure these 2 tests were not duplicating.

## 2018-05-07 ENCOUNTER — Encounter: Payer: Self-pay | Admitting: Family Medicine

## 2018-05-07 DIAGNOSIS — I10 Essential (primary) hypertension: Secondary | ICD-10-CM | POA: Diagnosis not present

## 2018-05-07 DIAGNOSIS — R7303 Prediabetes: Secondary | ICD-10-CM | POA: Diagnosis not present

## 2018-05-07 DIAGNOSIS — R002 Palpitations: Secondary | ICD-10-CM | POA: Diagnosis not present

## 2018-05-13 DIAGNOSIS — I8312 Varicose veins of left lower extremity with inflammation: Secondary | ICD-10-CM | POA: Diagnosis not present

## 2018-05-15 ENCOUNTER — Telehealth: Payer: Self-pay | Admitting: Obstetrics and Gynecology

## 2018-05-15 NOTE — Telephone Encounter (Signed)
Call placed in reference to a referral for [facility].

## 2018-05-16 NOTE — Telephone Encounter (Signed)
Patient returned call to Rosa. °

## 2018-05-16 NOTE — Telephone Encounter (Signed)
Called placed in reference to referral for [facility].

## 2018-05-29 DIAGNOSIS — D225 Melanocytic nevi of trunk: Secondary | ICD-10-CM | POA: Diagnosis not present

## 2018-05-29 DIAGNOSIS — L821 Other seborrheic keratosis: Secondary | ICD-10-CM | POA: Diagnosis not present

## 2018-05-29 DIAGNOSIS — L280 Lichen simplex chronicus: Secondary | ICD-10-CM | POA: Diagnosis not present

## 2018-06-04 ENCOUNTER — Encounter: Payer: 59 | Attending: Family Medicine | Admitting: *Deleted

## 2018-06-04 DIAGNOSIS — E119 Type 2 diabetes mellitus without complications: Secondary | ICD-10-CM | POA: Insufficient documentation

## 2018-06-04 DIAGNOSIS — Z713 Dietary counseling and surveillance: Secondary | ICD-10-CM | POA: Diagnosis present

## 2018-06-04 NOTE — Patient Instructions (Signed)
Plan:  Aim for 2 Carb Choices per meal (30 grams) +/- 1 either way  Aim for 0-1 Carbs per snack if hungry  Include protein in moderation with your meals and snacks Consider reading food labels for Total Carbohydrate of foods Consider  increasing your activity level by arm chair exercises or stationary bike for 5-15 minutes daily as tolerated Consider checking BG at alternate times per day especially after exercise and occasionally after a meal

## 2018-06-04 NOTE — Progress Notes (Signed)
Diabetes Self-Management Education  Visit Type: First/Initial  Appt. Start Time: 1400 Appt. End Time: 2505  06/04/2018  [name], identified by name and date of birth, is a 62 y.o. female with a diagnosis of Diabetes: Type 2. She states history of pre-diabetes for several years. She has "bad knees" so is unable to walk as form of exercise. She enjoys water exercises, but due to work and drive time to commute, she has little time for that. She states she does have a stationary bike at home and is interested in working with that.   ASSESSMENT  Height 5\' 1"  (1.549 m), weight 167 lb 6.4 oz (75.9 kg), last menstrual period 11/05/2002. Body mass index is 31.63 kg/m.  Diabetes Self-Management Education - 06/04/18 1416      Visit Information   Visit Type  First/Initial      Initial Visit   Diabetes Type  Type 2    Are you currently following a meal plan?  No    Are you taking your medications as prescribed?  Not on Medications    Date Diagnosed  05/07/2018      Health Coping   How would you rate your overall health?  Good      Psychosocial Assessment   Patient Belief/Attitude about Diabetes  Motivated to manage diabetes    Self-care barriers  None    Other persons present  Patient    Patient Concerns  Nutrition/Meal planning;Glycemic Control    Special Needs  None    Learning Readiness  Change in progress    How often do you need to have someone help you when you read instructions, pamphlets, or other written materials from your doctor or pharmacy?  1 - Never    What is the last grade level you completed in school?  Associate's Degree      Pre-Education Assessment   Patient understands the diabetes disease and treatment process.  Needs Instruction    Patient understands incorporating nutritional management into lifestyle.  Needs Instruction    Patient undertands incorporating physical activity into lifestyle.  Needs Instruction    Patient understands monitoring blood  glucose, interpreting and using results  Needs Instruction    Patient understands prevention, detection, and treatment of chronic complications.  Needs Instruction    Patient understands how to develop strategies to address psychosocial issues.  Needs Instruction    Patient understands how to develop strategies to promote health/change behavior.  Needs Instruction      Complications   Last HgB A1C per patient/outside source  6.5 %    How often do you check your blood sugar?  1-2 times/day    Fasting Blood glucose range (mg/dL)  70-129;130-179    Postprandial Blood glucose range (mg/dL)  130-179    Number of hypoglycemic episodes per month  0    Have you had a dilated eye exam in the past 12 months?  Yes    Have you had a dental exam in the past 12 months?  Yes    Are you checking your feet?  Yes    How many days per week are you checking your feet?  7      Dietary Intake   Breakfast  8:30 AM at work- yogurt     Lunch  11:30 ramen noodles in the past, now once a week. OR cottage cheese or regular cheese with triscuits, finish with sunflower seeds 9 oz cup    Snack (afternoon)  no  Dinner  lean meat, starch and vegetables and salad and finish with sunflower seeds    Snack (evening)  occasionally popcorn    Beverage(s)  coffee, black unsweetened green and black tea, water, enjoys red zinfindel with dinner but has stopped with DM 2 diagnosis      Exercise   Exercise Type  ADL's    How many days per week to you exercise?  0    How many minutes per day do you exercise?  0    Total minutes per week of exercise  0      Patient Education   Previous Diabetes Education  No    Disease state   Definition of diabetes, type 1 and 2, and the diagnosis of diabetes;Factors that contribute to the development of diabetes    Nutrition management   Role of diet in the treatment of diabetes and the relationship between the three main macronutrients and blood glucose level;Carbohydrate counting;Food label  reading, portion sizes and measuring food.;Reviewed blood glucose goals for pre and post meals and how to evaluate the patients' food intake on their blood glucose level.    Physical activity and exercise   Role of exercise on diabetes management, blood pressure control and cardiac health.;Helped patient identify appropriate exercises in relation to his/her diabetes, diabetes complications and other health issue.    Monitoring  Purpose and frequency of SMBG.;Identified appropriate SMBG and/or A1C goals.    Chronic complications  Relationship between chronic complications and blood glucose control    Psychosocial adjustment  Role of stress on diabetes      Individualized Goals (developed by patient)   Nutrition  Follow meal plan discussed    Physical Activity  Exercise 3-5 times per week    Medications  Not Applicable    Monitoring   test blood glucose pre and post meals as discussed      Post-Education Assessment   Patient understands the diabetes disease and treatment process.  Demonstrates understanding / competency    Patient understands incorporating nutritional management into lifestyle.  Demonstrates understanding / competency    Patient undertands incorporating physical activity into lifestyle.  Demonstrates understanding / competency    Patient understands monitoring blood glucose, interpreting and using results  Demonstrates understanding / competency    Patient understands prevention, detection, and treatment of acute complications.  Demonstrates understanding / competency    Patient understands prevention, detection, and treatment of chronic complications.  Demonstrates understanding / competency    Patient understands how to develop strategies to address psychosocial issues.  Demonstrates understanding / competency    Patient understands how to develop strategies to promote health/change behavior.  Demonstrates understanding / competency      Outcomes   Expected Outcomes   Demonstrated interest in learning. Expect positive outcomes    Future DMSE  2 months    Program Status  Completed       Individualized Plan for Diabetes Self-Management Training:   Learning Objective:  Patient will have a greater understanding of diabetes self-management. Patient education plan is to attend individual and/or group sessions per assessed needs and concerns.   Plan:   Patient Instructions  Plan:  Aim for 2 Carb Choices per meal (30 grams) +/- 1 either way  Aim for 0-1 Carbs per snack if hungry  Include protein in moderation with your meals and snacks Consider reading food labels for Total Carbohydrate of foods Consider  increasing your activity level by arm chair exercises or stationary bike for 5-15  minutes daily as tolerated Consider checking BG at alternate times per day especially after exercise and occasionally after a meal  Expected Outcomes:  Demonstrated interest in learning. Expect positive outcomes  Education material provided: ADA Diabetes: Your Take Control Guide, Food label handouts, A1C conversion sheet, Meal plan card and Carbohydrate counting sheet  If problems or questions, patient to contact team via:  Phone  Future DSME appointment: 2 months

## 2018-06-06 DIAGNOSIS — R3121 Asymptomatic microscopic hematuria: Secondary | ICD-10-CM | POA: Diagnosis not present

## 2018-06-06 DIAGNOSIS — N3946 Mixed incontinence: Secondary | ICD-10-CM | POA: Diagnosis not present

## 2018-07-17 DIAGNOSIS — M1711 Unilateral primary osteoarthritis, right knee: Secondary | ICD-10-CM | POA: Diagnosis not present

## 2018-07-17 DIAGNOSIS — M1712 Unilateral primary osteoarthritis, left knee: Secondary | ICD-10-CM | POA: Diagnosis not present

## 2018-07-29 DIAGNOSIS — N200 Calculus of kidney: Secondary | ICD-10-CM | POA: Diagnosis not present

## 2018-07-29 DIAGNOSIS — Z1211 Encounter for screening for malignant neoplasm of colon: Secondary | ICD-10-CM | POA: Diagnosis not present

## 2018-07-29 DIAGNOSIS — I10 Essential (primary) hypertension: Secondary | ICD-10-CM | POA: Diagnosis not present

## 2018-07-29 DIAGNOSIS — Z23 Encounter for immunization: Secondary | ICD-10-CM | POA: Diagnosis not present

## 2018-07-29 DIAGNOSIS — R7303 Prediabetes: Secondary | ICD-10-CM | POA: Diagnosis not present

## 2018-07-29 DIAGNOSIS — Z0000 Encounter for general adult medical examination without abnormal findings: Secondary | ICD-10-CM | POA: Diagnosis not present

## 2018-07-29 DIAGNOSIS — R3121 Asymptomatic microscopic hematuria: Secondary | ICD-10-CM | POA: Diagnosis not present

## 2018-08-01 ENCOUNTER — Other Ambulatory Visit: Payer: Self-pay | Admitting: Urology

## 2018-08-01 DIAGNOSIS — R3121 Asymptomatic microscopic hematuria: Secondary | ICD-10-CM

## 2018-08-12 DIAGNOSIS — N3946 Mixed incontinence: Secondary | ICD-10-CM | POA: Diagnosis not present

## 2018-08-12 DIAGNOSIS — R3121 Asymptomatic microscopic hematuria: Secondary | ICD-10-CM | POA: Diagnosis not present

## 2018-08-13 ENCOUNTER — Ambulatory Visit
Admission: RE | Admit: 2018-08-13 | Discharge: 2018-08-13 | Disposition: A | Discharge status: Home / Self Care | Payer: 59 | Source: Ambulatory Visit | Attending: Urology | Admitting: Urology

## 2018-08-13 DIAGNOSIS — R3121 Asymptomatic microscopic hematuria: Secondary | ICD-10-CM

## 2018-08-13 DIAGNOSIS — R1907 Generalized intra-abdominal and pelvic swelling, mass and lump: Secondary | ICD-10-CM | POA: Diagnosis not present

## 2018-08-13 MED ORDER — GADOBENATE DIMEGLUMINE 529 MG/ML IV SOLN
15.0000 mL | Freq: Once | INTRAVENOUS | Status: AC | PRN
  Administered 2018-08-13: 15 mL via INTRAVENOUS

## 2018-09-03 ENCOUNTER — Other Ambulatory Visit: Payer: Self-pay | Admitting: General Surgery

## 2018-09-03 DIAGNOSIS — R1900 Intra-abdominal and pelvic swelling, mass and lump, unspecified site: Secondary | ICD-10-CM

## 2018-09-15 ENCOUNTER — Other Ambulatory Visit: Payer: Self-pay | Admitting: Radiology

## 2018-09-16 ENCOUNTER — Other Ambulatory Visit: Payer: Self-pay | Admitting: Radiology

## 2018-09-17 ENCOUNTER — Encounter (HOSPITAL_COMMUNITY): Payer: Self-pay

## 2018-09-17 ENCOUNTER — Ambulatory Visit (HOSPITAL_COMMUNITY)
Admission: RE | Admit: 2018-09-17 | Discharge: 2018-09-17 | Disposition: A | Discharge status: Home / Self Care | Payer: 59 | Source: Ambulatory Visit | Attending: General Surgery | Admitting: General Surgery

## 2018-09-17 DIAGNOSIS — C775 Secondary and unspecified malignant neoplasm of intrapelvic lymph nodes: Secondary | ICD-10-CM | POA: Diagnosis not present

## 2018-09-17 DIAGNOSIS — Z8379 Family history of other diseases of the digestive system: Secondary | ICD-10-CM | POA: Insufficient documentation

## 2018-09-17 DIAGNOSIS — Z79899 Other long term (current) drug therapy: Secondary | ICD-10-CM | POA: Diagnosis not present

## 2018-09-17 DIAGNOSIS — Z882 Allergy status to sulfonamides status: Secondary | ICD-10-CM | POA: Diagnosis not present

## 2018-09-17 DIAGNOSIS — Z7982 Long term (current) use of aspirin: Secondary | ICD-10-CM | POA: Diagnosis not present

## 2018-09-17 DIAGNOSIS — I10 Essential (primary) hypertension: Secondary | ICD-10-CM | POA: Diagnosis not present

## 2018-09-17 DIAGNOSIS — R1909 Other intra-abdominal and pelvic swelling, mass and lump: Secondary | ICD-10-CM | POA: Diagnosis not present

## 2018-09-17 DIAGNOSIS — R7303 Prediabetes: Secondary | ICD-10-CM | POA: Diagnosis not present

## 2018-09-17 DIAGNOSIS — R1900 Intra-abdominal and pelvic swelling, mass and lump, unspecified site: Secondary | ICD-10-CM | POA: Diagnosis not present

## 2018-09-17 DIAGNOSIS — Z833 Family history of diabetes mellitus: Secondary | ICD-10-CM | POA: Insufficient documentation

## 2018-09-17 DIAGNOSIS — Z9889 Other specified postprocedural states: Secondary | ICD-10-CM | POA: Diagnosis not present

## 2018-09-17 DIAGNOSIS — C801 Malignant (primary) neoplasm, unspecified: Secondary | ICD-10-CM | POA: Diagnosis not present

## 2018-09-17 LAB — CBC
HEMATOCRIT: 42.2 % (ref 36.0–46.0)
HEMOGLOBIN: 14.4 g/dL (ref 12.0–15.0)
MCH: 32.1 pg (ref 26.0–34.0)
MCHC: 34.1 g/dL (ref 30.0–36.0)
MCV: 94.2 fL (ref 80.0–100.0)
Platelets: 299 10*3/uL (ref 150–400)
RBC: 4.48 MIL/uL (ref 3.87–5.11)
RDW: 12.4 % (ref 11.5–15.5)
WBC: 7.5 10*3/uL (ref 4.0–10.5)
nRBC: 0 % (ref 0.0–0.2)

## 2018-09-17 LAB — PROTIME-INR
INR: 0.95
Prothrombin Time: 12.6 seconds (ref 11.4–15.2)

## 2018-09-17 MED ORDER — MIDAZOLAM HCL 2 MG/2ML IJ SOLN
INTRAMUSCULAR | Status: AC
  Filled 2018-09-17: qty 4

## 2018-09-17 MED ORDER — FENTANYL CITRATE (PF) 100 MCG/2ML IJ SOLN
INTRAMUSCULAR | Status: AC | PRN
  Administered 2018-09-17: 50 ug via INTRAVENOUS
  Administered 2018-09-17: 25 ug via INTRAVENOUS

## 2018-09-17 MED ORDER — SODIUM CHLORIDE 0.9 % IV SOLN: INTRAVENOUS | Status: DC

## 2018-09-17 MED ORDER — FENTANYL CITRATE (PF) 100 MCG/2ML IJ SOLN
INTRAMUSCULAR | Status: AC
  Filled 2018-09-17: qty 4

## 2018-09-17 MED ORDER — MIDAZOLAM HCL 2 MG/2ML IJ SOLN
INTRAMUSCULAR | Status: AC | PRN
  Administered 2018-09-17: 1 mg via INTRAVENOUS
  Administered 2018-09-17: 0.5 mg via INTRAVENOUS

## 2018-09-17 MED ORDER — HYDROCODONE-ACETAMINOPHEN 5-325 MG PO TABS
1.0000 | ORAL_TABLET | ORAL | Status: DC | PRN
  Filled 2018-09-17: qty 2

## 2018-09-17 MED ORDER — LIDOCAINE HCL 1 % IJ SOLN
INTRAMUSCULAR | Status: AC
  Filled 2018-09-17: qty 20

## 2018-09-17 NOTE — H&P (Signed)
Chief Complaint: Left pelvic mass  Referring Physician(s): Omar  Supervising Physician: Arne Cleveland  Patient Status: [facility] - Out-pt  History of Present Illness: Angelica Wright is a 62 y.o. female who was initially seen by urology for hematuria which prompted imaging.  Angelica Wright has known non obstructing renal calculi and also noted was a left pelvic sidewall mass/node.  Angelica Wright is here today for biopsy of that mass.  Angelica Wright is NPO. Angelica Wright feels well. No fever/chills. No N/V.  ROS otherwise negative. No blood thinners.  Past Medical History:  Diagnosis Date  . Family history of breast cancer   . Family history of uterine cancer   . Hyperparathyroidism ([place])    resolved after surgery  . Hypertension   . Osteoporosis    of the spine  . Prediabetes 1/15   by PCP  . Rectocele   . TMJ syndrome     Past Surgical History:  Procedure Laterality Date  . PARATHYROIDECTOMY    . THERAPEUTIC ABORTION     x 2  . VULVA /PERINEUM BIOPSY  3/12    Allergies: Sulfa antibiotics  Medications: Prior to Admission medications   Medication Sig Start Date End Date Taking? Authorizing Provider  aspirin 81 MG tablet Take 81 mg by mouth every evening.    Yes [provider]  atenolol (TENORMIN) 50 MG tablet Take 50 mg by mouth every evening.    Yes [provider]  calcium carbonate (TUMS - DOSED IN MG ELEMENTAL CALCIUM) 500 MG chewable tablet Chew 1 tablet by mouth daily as needed for indigestion or heartburn.   Yes [provider]  Carboxymethylcellul-Glycerin (LUBRICATING EYE DROPS OP) Place 1 drop into both eyes daily as needed (dry eyes).   Yes [provider]  cholecalciferol (VITAMIN D3) 25 MCG (1000 UT) tablet Take 2,000 Units by mouth every evening.   Yes [provider]  clobetasol ointment (TEMOVATE) 0.05 % Apply a pea sized amount topically BID x 1-2 weeks prn flare Patient taking differently: Apply 1 application topically 2  (two) times daily as needed (outbreaks). Use for 1 to 2 weeks 01/16/18  Yes Salvadore Dom, MD  Coenzyme Q10 (CO Q 10) 100 MG CAPS Take 100 mg by mouth daily.    Yes [provider]  hydrochlorothiazide (HYDRODIURIL) 12.5 MG tablet Take 12.5 mg by mouth daily.  06/23/16  Yes [provider]  ibuprofen (ADVIL,MOTRIN) 100 MG tablet Take 400-600 mg by mouth 2 (two) times daily as needed for pain.   Yes [provider]  Magnesium 250 MG TABS Take 250 mg by mouth every evening.   Yes [provider]  Multiple Vitamin (MULTIVITAMIN WITH MINERALS) TABS tablet Take 1 tablet by mouth daily.   Yes [provider]  SUPER B COMPLEX/C PO Take 1 tablet by mouth daily.   Yes [provider]  valACYclovir (VALTREX) 500 MG tablet Take one tab po BID for 3 days with flares. Patient taking differently: Take 500 mg by mouth 2 (two) times daily as needed (breakouts). Take for 3 days 01/16/18  Yes Salvadore Dom, MD  vitamin C (ASCORBIC ACID) 500 MG tablet Take 500 mg by mouth every evening.   Yes [provider]  NONFORMULARY OR COMPOUNDED ITEM Estradiol 0.02% . Insert 1 gram vaginally 2 times per week. 3 months 3 Refills. Patient not taking: Reported on 09/11/2018 01/16/18   Salvadore Dom, MD     Family History  Problem Relation Age of  Onset  . Cancer Mother 41       uterine  . Breast cancer Mother 56       lumpectomy, x2 in other breast in late 60's  . Breast cancer Maternal Aunt        dx in early 40's, x2 in late 80's  . Breast cancer Paternal Aunt 71  . Breast cancer Paternal Aunt 85  . Cirrhosis Father   . Liver cancer Father   . Stroke Maternal Grandmother   . Stroke Maternal Grandfather   . Emphysema Paternal Grandmother   . Diabetes Paternal Grandfather   . Breast cancer Cousin 8    Social History   Socioeconomic History  . Marital status: Married    Spouse name: Not on file  . Number of children: Not on file  .  Years of education: Not on file  . Highest education level: Not on file  Occupational History  . Not on file  Social Needs  . Financial resource strain: Not on file  . Food insecurity:    Worry: Not on file    Inability: Not on file  . Transportation needs:    Medical: Not on file    Non-medical: Not on file  Tobacco Use  . Smoking status: Never Smoker  . Smokeless tobacco: Never Used  Substance and Sexual Activity  . Alcohol use: Yes    Alcohol/week: 7.0 standard drinks    Types: 7 Standard drinks or equivalent per week    Comment: 1 glass of wine/night  . Drug use: No  . Sexual activity: Yes    Partners: Male    Birth control/protection: Other-see comments    Comment: vasectomy  Lifestyle  . Physical activity:    Days per week: Not on file    Minutes per session: Not on file  . Stress: Not on file  Relationships  . Social connections:    Talks on phone: Not on file    Gets together: Not on file    Attends religious service: Not on file    Active member of club or organization: Not on file    Attends meetings of clubs or organizations: Not on file    Relationship status: Not on file  Other Topics Concern  . Not on file  Social History Narrative  . Not on file     Review of Systems: A 12 point ROS discussed and pertinent positives are indicated in the HPI above.  All other systems are negative.  Review of Systems  Vital Signs: BP 125/77   Pulse 85   Temp 98.2 F (36.8 C) (Oral)   Resp 16   Ht 5\' 1"  (1.549 m)   Wt 73.9 kg   LMP 11/05/2002 (Approximate)   SpO2 98%   BMI 30.80 kg/m   Physical Exam  Constitutional: Angelica Wright is oriented to person, place, and time. Angelica Wright appears well-developed.  HENT:  Head: Normocephalic and atraumatic.  Eyes: EOM are normal.  Neck: Normal range of motion.  Cardiovascular: Normal rate, regular rhythm and normal heart sounds.  Pulmonary/Chest: Effort normal and breath sounds normal. No respiratory distress.  Abdominal: Soft.    Musculoskeletal: Normal range of motion.  Neurological: Angelica Wright is alert and oriented to person, place, and time.  Skin: Skin is warm and dry.  Psychiatric: Angelica Wright has a normal mood and affect. Angelica Wright behavior is normal. Judgment and thought content normal.  Vitals reviewed.   Imaging: CLINICAL DATA:  Left pelvic sidewall mass.  EXAM: MRI PELVIS  WITHOUT AND WITH CONTRAST  TECHNIQUE: Multiplanar multisequence MR imaging of the pelvis was performed both before and after administration of intravenous contrast.  CONTRAST:  83mL MULTIHANCE GADOBENATE DIMEGLUMINE 529 MG/ML IV SOLN  Creatinine was obtained on site at [facility] at [address]  Results: Creatinine 0.7 mg/dL.  COMPARISON:  CT abdomen pelvis dated July 29, 2018.  FINDINGS: Urinary Tract:  No abnormality visualized.  Bowel: Sigmoid diverticulosis. Otherwise unremarkable visualized pelvic bowel loops.  Vascular/Lymphatic: No significant vascular abnormality seen.  Reproductive:  No mass or other significant abnormality.  Other: 1.8 x 1.8 cm centrally necrotic, rim enhancing mass in the left hemipelvis adjacent to the posterior pelvic sidewall. No free fluid.  Musculoskeletal: No marrow signal abnormality. Tarlov cysts in the sacrum. No muscle edema or atrophy. The visualized gluteal, hamstring, and iliopsoas tendons are unremarkable.  IMPRESSION: 1. Centrally necrotic 1.8 cm enlarged lymph node versus nonspecific soft tissue mass in the left hemipelvis adjacent to the posterior pelvic sidewall. Further evaluation with biopsy and/or PET-CT is recommended.   Electronically Signed   By: Titus Dubin M.D.   On: 08/14/2018 12:15  Labs:  CBC: Recent Labs    09/17/18 0615  WBC 7.5  HGB 14.4  HCT 42.2  PLT 299    COAGS: Recent Labs    09/17/18 0615  INR 0.95    BMP: No results for input(s): NA, K, CL, CO2, GLUCOSE, BUN, CALCIUM, CREATININE, GFRNONAA, GFRAA in the  last 8760 hours.  Invalid input(s): CMP  LIVER FUNCTION TESTS: No results for input(s): BILITOT, AST, ALT, ALKPHOS, PROT, ALBUMIN in the last 8760 hours.  TUMOR MARKERS: No results for input(s): AFPTM, CEA, CA199, CHROMGRNA in the last 8760 hours.  Assessment and Plan:  Left pelvic mass/node.  Will proceed with image guided biopsy today by Dr. Vernard Gambles.  Risks and benefits discussed with the patient including, but not limited to bleeding, infection, damage to adjacent structures or low yield requiring additional tests.  All of the patient's questions were answered, patient is agreeable to proceed. Consent signed and in chart.  Thank you for this interesting consult.  I greatly enjoyed meeting Skyllar [facility] and look forward to participating in their care.  A copy of this report was sent to the requesting provider on this date.  Electronically Signed: Murrell Redden, PA-C   09/17/2018, 7:25 AM      I spent a total of  30 Minutes  in face to face in clinical consultation, greater than 50% of which was counseling/coordinating care for biopsy left pelvic mass.

## 2018-09-17 NOTE — Procedures (Signed)
  Procedure: CT core biopsy L pelvic mass 18g x3 EBL:   minimal Complications:  none immediate  See full dictation in [facility].  Dillard Cannon MD Main # [phone] Pager  [phone]

## 2018-09-17 NOTE — Discharge Instructions (Addendum)

## 2018-09-25 ENCOUNTER — Telehealth: Payer: Self-pay | Admitting: *Deleted

## 2018-09-25 NOTE — Telephone Encounter (Signed)
Called and left the patient a message to the office back. Need to schedule the patient for an appt on 11/25

## 2018-09-29 ENCOUNTER — Encounter: Payer: Self-pay | Admitting: Obstetrics

## 2018-09-29 ENCOUNTER — Inpatient Hospital Stay: Payer: 59 | Attending: Obstetrics | Admitting: Obstetrics

## 2018-09-29 VITALS — BP 153/67 | HR 85 | Temp 98.1°F | Resp 18 | Ht 61.0 in | Wt 160.0 lb

## 2018-09-29 DIAGNOSIS — E1169 Type 2 diabetes mellitus with other specified complication: Secondary | ICD-10-CM

## 2018-09-29 DIAGNOSIS — C801 Malignant (primary) neoplasm, unspecified: Secondary | ICD-10-CM | POA: Diagnosis not present

## 2018-09-29 DIAGNOSIS — C775 Secondary and unspecified malignant neoplasm of intrapelvic lymph nodes: Secondary | ICD-10-CM | POA: Diagnosis not present

## 2018-09-29 DIAGNOSIS — E119 Type 2 diabetes mellitus without complications: Secondary | ICD-10-CM | POA: Insufficient documentation

## 2018-09-29 DIAGNOSIS — C779 Secondary and unspecified malignant neoplasm of lymph node, unspecified: Secondary | ICD-10-CM

## 2018-09-29 NOTE — Patient Instructions (Signed)
1. We will order a PET scan 2. Referral will be made to medical oncology and gastroenterology

## 2018-09-30 ENCOUNTER — Telehealth: Payer: Self-pay | Admitting: Oncology

## 2018-09-30 NOTE — Progress Notes (Signed)
[place] at [facility] Note: New Patient FIRST VISIT   Consult was requested by Dr. Georganna Skeans for a deep pelvic lymph node c/w biopsy-proven SCCa   Chief Complaint  Patient presents with  . Squamous Cell Carcinoma    unknown primary    GYN Oncologic Summary 1. TBD o .  HPI: [name]  is a very nice 62 y.o.  P2  In March her Gyn noted microscopic hematuria. The UA was repeated again in June and noted to be + for heme again. [place] does have a history of nephrolithiasis. She was referred to urology who ordered CT imaging 07/29/18 (on PACS) and performed what sounds like in-office cystourethroscopy with apparently normal findings. The CT revealed a deep pelvic mass and CT guided biopsy was ordered. On 09/17/18 biopsy revealed: Lymph node, needle/core biopsy, left pelvic - SQUAMOUS CELL CARCINOMA. - SEE COMMENT. Microscopic Comment Dr. Vicente Males has reviewed the case and concurs with this interpretation. (JBK:ah 09/18/18) Enid Cutter MD Given the suspicion this was a pelvic lymph node and the histology of SCCa she was referred to [place] for further workup as to the etiology of the mass.  She has no complaints. She notes years of diarrhea/cramps but has never seen a GI or had a colonscopy. She has done a stool sind-in test (last being Sept 2019) and thinks those were likely normal as she never heard otherwise. She does have "hemorrhoids" that have bled 1-2 times in the last year with straining/hard BM. She has a history of lichen sclerosis of the vulva and perianal region by report. She states she is compliant with Pap screening and we have a copy of a 01/2018 normal Pap with negative HRHPV. She has a dermatologic lesion on her left LE, but her dermatologist has followed this for 2 years and feels it is c/w psoriasis. The patient takes medication for this diagnosis and the lesion has had times where it waxes and wanes in  severity.  She denies any other bleeding other than the couple of rare spots due to her "hemmorhoids". She denies pain.  Imported EPIC Oncologic History:   No history exists.   Radiology:  CT OSH - 07/29/18 - ~1cm left pelvic sidewall mass  Mr Pelvis W Wo Contrast  Result Date: 08/14/2018 CLINICAL DATA:  Left pelvic sidewall mass. EXAM: MRI PELVIS WITHOUT AND WITH CONTRAST TECHNIQUE: Multiplanar multisequence MR imaging of the pelvis was performed both before and after administration of intravenous contrast. CONTRAST:  42mL MULTIHANCE GADOBENATE DIMEGLUMINE 529 MG/ML IV SOLN Creatinine was obtained on site at [facility] at [address] Results: Creatinine 0.7 mg/dL. COMPARISON:  CT abdomen pelvis dated July 29, 2018. FINDINGS: Urinary Tract:  No abnormality visualized. Bowel: Sigmoid diverticulosis. Otherwise unremarkable visualized pelvic bowel loops. Vascular/Lymphatic: No significant vascular abnormality seen. Reproductive:  No mass or other significant abnormality. Other: 1.8 x 1.8 cm centrally necrotic, rim enhancing mass in the left hemipelvis adjacent to the posterior pelvic sidewall. No free fluid. Musculoskeletal: No marrow signal abnormality. Tarlov cysts in the sacrum. No muscle edema or atrophy. The visualized gluteal, hamstring, and iliopsoas tendons are unremarkable. IMPRESSION: 1. Centrally necrotic 1.8 cm enlarged lymph node versus nonspecific soft tissue mass in the left hemipelvis adjacent to the posterior pelvic sidewall. Further evaluation with biopsy and/or PET-CT is recommended. Electronically Signed   By: Titus Dubin M.D.   On: 08/14/2018 12:15   Ct Biopsy  Result Date:  09/17/2018 CLINICAL DATA:  Deep left pelvic mass versus adenopathy EXAM: CT GUIDED CORE BIOPSY OF LEFT PELVIC NODULE ANESTHESIA/SEDATION: Intravenous Fentanyl and Versed were administered as conscious sedation during continuous monitoring of the patient's level of consciousness and  physiological / cardiorespiratory status by the radiology RN, with a total moderate sedation time of 10 minutes. PROCEDURE: The procedure risks, benefits, and alternatives were explained to the patient. Questions regarding the procedure were encouraged and answered. The patient understands and consents to the procedure. Patient placed prone. Select axial scans through the pelvis were obtained. The lesion was localized and an appropriate skin entry site was determined and marked. The operative field was prepped with chlorhexidinein a sterile fashion, and a sterile drape was applied covering the operative field. A sterile gown and sterile gloves were used for the procedure. Local anesthesia was provided with 1% Lidocaine. Under CT fluoroscopic guidance, a 17 gauge trocar needle was advanced to the margin of the lesion. Once needle tip position was confirmed, coaxial 18-gauge core biopsy samples were obtained, submitted in saline to surgical pathology. The guide needle was removed. Postprocedure scans show minimal regional hemorrhage. Patient remained asymptomatic and hemodynamically stable. COMPLICATIONS: None immediate FINDINGS: Deep left pelvic nodule was again localized. Representative core biopsy samples obtained as above. IMPRESSION: 1. Technically successful CT-guided core biopsy, left pelvic nodule Electronically Signed   By: Lucrezia Europe M.D.   On: 09/17/2018 14:26   .   Outpatient Encounter Medications as of 09/29/2018  Medication Sig  . aspirin 81 MG tablet Take 81 mg by mouth every evening.   Marland Kitchen atenolol (TENORMIN) 50 MG tablet Take 50 mg by mouth every evening.   . calcium carbonate (TUMS - DOSED IN MG ELEMENTAL CALCIUM) 500 MG chewable tablet Chew 1 tablet by mouth daily as needed for indigestion or heartburn.  . cholecalciferol (VITAMIN D3) 25 MCG (1000 UT) tablet Take 2,000 Units by mouth every evening.  . clobetasol ointment (TEMOVATE) 0.05 % Apply a pea sized amount topically BID x 1-2 weeks  prn flare (Patient taking differently: Apply 1 application topically 2 (two) times daily as needed (outbreaks). Use for 1 to 2 weeks)  . Coenzyme Q10 (CO Q 10) 100 MG CAPS Take 100 mg by mouth daily.   . hydrochlorothiazide (HYDRODIURIL) 12.5 MG tablet Take 12.5 mg by mouth daily.   Marland Kitchen ibuprofen (ADVIL,MOTRIN) 100 MG tablet Take 400-600 mg by mouth 2 (two) times daily as needed for pain.  . Magnesium 250 MG TABS Take 250 mg by mouth every evening.  . Multiple Vitamin (MULTIVITAMIN WITH MINERALS) TABS tablet Take 1 tablet by mouth daily.  . SUPER B COMPLEX/C PO Take 1 tablet by mouth daily.  . valACYclovir (VALTREX) 500 MG tablet Take one tab po BID for 3 days with flares. (Patient taking differently: Take 500 mg by mouth 2 (two) times daily as needed (breakouts). Take for 3 days)  . vitamin C (ASCORBIC ACID) 500 MG tablet Take 500 mg by mouth every evening.  . [DISCONTINUED] Carboxymethylcellul-Glycerin (LUBRICATING EYE DROPS OP) Place 1 drop into both eyes daily as needed (dry eyes).  . [DISCONTINUED] NONFORMULARY OR COMPOUNDED ITEM Estradiol 0.02% . Insert 1 gram vaginally 2 times per week. 3 months 3 Refills.   No facility-administered encounter medications on file as of 09/29/2018.    Allergies  Allergen Reactions  . Sulfa Antibiotics Nausea And Vomiting    Past Medical History:  Diagnosis Date  . Hyperparathyroidism ([place])    resolved after surgery  .  Hypertension   . Osteoporosis    of the spine  . Rectocele   . TMJ syndrome   . Type 2 Diabetes ([place]) 11/2013   controlled with diet/exer   Past Surgical History:  Procedure Laterality Date  . PARATHYROIDECTOMY  2000  . THERAPEUTIC ABORTION     x 2  . VULVA /PERINEUM BIOPSY  2012        Past Gynecological History:   GYNECOLOGIC HISTORY:  . Patient's last menstrual period was 11/05/2002 (approximate).  . Menarche: 62 years old . P 2 . Contraceptive h/o OCP . HRT yes for about 10 years due to early onset menopause  . Last  Pap 01/2018 normal / Neg HRHPV Family Hx:  Family History  Problem Relation Age of Onset  . Cancer Mother 14       uterine (thinks)  . Breast cancer Mother 31       lumpectomy, x2 in other breast in late 18's  . Breast cancer Maternal Aunt        dx in early 84's, x2 in late 80's  . Breast cancer Paternal Aunt 62  . Breast cancer Paternal Aunt 85  . Cirrhosis Father        ETOH related  . Liver cancer Father   . Lung cancer Father        tobacco user  . Stroke Maternal Grandmother   . Stroke Maternal Grandfather   . Emphysema Paternal Grandmother   . Diabetes Paternal Grandfather   . Breast cancer Cousin 23   Social Hx:  Marland Kitchen Tobacco use: none . Alcohol use: 1-2 / week . Illicit Drug use: none . Illicit IV Drug use: none    Review of Systems: Review of Systems  Constitutional: Negative.   HENT:  Negative.   Eyes: Negative.   Respiratory: Negative.   Cardiovascular: Negative.   Gastrointestinal: Negative.   Endocrine: Negative.   Genitourinary: Positive for hematuria.   Musculoskeletal: Negative.   Skin: Negative.   Neurological: Negative.   Hematological: Negative.   Psychiatric/Behavioral: Negative.     Vitals:  Vitals:   09/29/18 1100  BP: (!) 153/67  Pulse: 85  Resp: 18  Temp: 98.1 F (36.7 C)  SpO2: 100%   Vitals:   09/29/18 1100  Weight: 160 lb (72.6 kg)  Height: 5\' 1"  (1.549 m)   Body mass index is 30.23 kg/m.  Physical Exam: General :  Well developed, 62 y.o., female in no apparent distress HEENT:  Normocephalic/atraumatic, symmetric, EOMI, eyelids normal Neck:   Supple, no masses.  Lymphatics:  No cervical/ submandibular/ supraclavicular/ infraclavicular/ inguinal adenopathy Respiratory:  Respirations unlabored, no use of accessory muscles CV:   Deferred Breast:  Deferred Musculoskeletal: No CVA tenderness, normal muscle strength. Abdomen:   Soft, non-tender and nondistended. No evidence of hernia. No masses. Extremities:  No lymphedema, no  erythema, non-tender. Skin:   Normal inspection Neuro/Psych:  No focal motor deficit, no abnormal mental status. Normal gait. Normal affect. Alert and oriented to person, place, and time  Genito Urinary: Vulva: +evidence of atrophy versus lichenoid changes with obliteration of some of the labial folds. Normal external female genitalia.  Bladder/urethra: Urethral meatus normal in size and location. No lesions or   masses, well supported bladder Speculum exam: Vagina: +atrophy No lesion, no discharge, no bleeding. + rectocele Cervix: Atrophic. Normal appearing, no lesions. Bimanual exam: normal Uterus: Normal size, mobile.  Adnexal region: No masses. Rectovaginal:  + posterior rectal wall mass measuring ~2x1cm about 1cm promixal  from the external sphincter. I attempted to look at this with a pediatric speculum but the visualization was limited. I believe there is an irregular lesion again on the posterior wall. There was a small amount of blood upon removal of my gloved finger.   Assessment  SCCA unknown primary, suspect anal with metastasis to left pelvic lymph node ECOG PERFORMANCE STATUS: 0 - Asymptomatic  Plan  1. Complexity of visit ? This is a new problem and additional workup is planned ? I will get her in with GI for biopsy of this lesion so treatment planning can move along ? Data reviewed ?  I independently reviewed the images and the radiology reports and discussed my interpretation in the presence of the patient and the husband today ? The mass is on the deep left pelvic sidewall, likely an obturator lymph node.  ? The GYN organs look normal on my review of the scans ? I reviewed her referring doctor's office notes and I have summarized in the HPI ? History was obtained from the patient and the chart ? This is an acute illness that may pose a threat to life if untreated ? Management discussed included chemo and radiation. ? I doubt surgery will be  recommended 2. Management ? Refer to Medical Oncology for metastatic SCCa, suspect anal primary ? Refer to GI to confirm with anoscopy and biopsy ? PET scan will be ordered to rule out distant metastases as that would likely change the radiation recommendation ? I will hold on radiation referral until PET returns &/or she is seen by Medical Oncology ? If GI workup negative consider followup with her dermatologist for biopsy of the LLE lesion 3. Prognosis ? We briefly discussed if the disease is isolated to where the radiation field can treat then that would lead to the best prognosis and possibly control of disease, however I defer to Medical Oncology for further discussion. 4. She is cleared from a GYN standpoint, unless there appears to be a need going forward  Face to face time with patient was 80 minutes. Over 50% of this time was spent on counseling and coordination of care.   Mart Piggs, MD Gynecologic Oncologist 09/30/2018, 12:26 PM    Cc: Georganna Skeans, MD (Referring Surgeon) Shirline Frees, MD (PCP) Sumner Boast, MD (Ob/Gyn)

## 2018-09-30 NOTE — Telephone Encounter (Signed)
Called in referral to [facility] - Dr. Loletha Carrow.  They said they will call patient to schedule appointment.

## 2018-10-07 ENCOUNTER — Telehealth: Payer: Self-pay | Admitting: *Deleted

## 2018-10-07 NOTE — Telephone Encounter (Signed)
Faxed referral to [facility] at [phone]

## 2018-10-08 ENCOUNTER — Other Ambulatory Visit: Payer: Self-pay | Admitting: Hematology

## 2018-10-09 ENCOUNTER — Other Ambulatory Visit: Payer: Self-pay

## 2018-10-09 ENCOUNTER — Ambulatory Visit (HOSPITAL_COMMUNITY)
Admission: RE | Admit: 2018-10-09 | Discharge: 2018-10-09 | Disposition: A | Discharge status: Home / Self Care | Payer: 59 | Source: Ambulatory Visit | Attending: Obstetrics | Admitting: Obstetrics

## 2018-10-09 DIAGNOSIS — C44520 Squamous cell carcinoma of anal skin: Secondary | ICD-10-CM | POA: Diagnosis not present

## 2018-10-09 DIAGNOSIS — C801 Malignant (primary) neoplasm, unspecified: Secondary | ICD-10-CM | POA: Diagnosis not present

## 2018-10-09 DIAGNOSIS — C779 Secondary and unspecified malignant neoplasm of lymph node, unspecified: Secondary | ICD-10-CM | POA: Diagnosis not present

## 2018-10-09 LAB — GLUCOSE, CAPILLARY: Glucose-Capillary: 124 mg/dL — ABNORMAL HIGH (ref 70–99)

## 2018-10-09 MED ORDER — FLUDEOXYGLUCOSE F - 18 (FDG) INJECTION
7.9600 | Freq: Once | INTRAVENOUS | Status: AC | PRN
  Administered 2018-10-09: 7.96 via INTRAVENOUS

## 2018-10-09 NOTE — Progress Notes (Signed)
Tumor Board Discussion 10/08/18  Rectal Mass BX Concurrent chemo/rad

## 2018-10-10 ENCOUNTER — Encounter: Payer: Self-pay | Admitting: Gastroenterology

## 2018-10-10 ENCOUNTER — Ambulatory Visit: Payer: 59 | Admitting: Hematology

## 2018-10-13 ENCOUNTER — Other Ambulatory Visit: Payer: Self-pay | Admitting: General Surgery

## 2018-10-13 DIAGNOSIS — C44520 Squamous cell carcinoma of anal skin: Secondary | ICD-10-CM | POA: Diagnosis not present

## 2018-10-13 DIAGNOSIS — K6289 Other specified diseases of anus and rectum: Secondary | ICD-10-CM | POA: Diagnosis not present

## 2018-10-14 ENCOUNTER — Inpatient Hospital Stay: Payer: 59 | Admitting: Hematology

## 2018-10-14 ENCOUNTER — Telehealth: Payer: Self-pay | Admitting: Hematology

## 2018-10-14 ENCOUNTER — Encounter: Payer: Self-pay | Admitting: Hematology

## 2018-10-14 DIAGNOSIS — C210 Malignant neoplasm of anus, unspecified: Secondary | ICD-10-CM | POA: Diagnosis present

## 2018-10-14 DIAGNOSIS — C211 Malignant neoplasm of anal canal: Secondary | ICD-10-CM

## 2018-10-14 DIAGNOSIS — C775 Secondary and unspecified malignant neoplasm of intrapelvic lymph nodes: Secondary | ICD-10-CM | POA: Insufficient documentation

## 2018-10-14 DIAGNOSIS — Z5111 Encounter for antineoplastic chemotherapy: Secondary | ICD-10-CM

## 2018-10-14 DIAGNOSIS — I10 Essential (primary) hypertension: Secondary | ICD-10-CM | POA: Insufficient documentation

## 2018-10-14 DIAGNOSIS — Z803 Family history of malignant neoplasm of breast: Secondary | ICD-10-CM

## 2018-10-14 DIAGNOSIS — E119 Type 2 diabetes mellitus without complications: Secondary | ICD-10-CM | POA: Insufficient documentation

## 2018-10-14 NOTE — Progress Notes (Signed)
[place]  Telephone:([phone]) [phone] Fax:([phone]) [place] Note   Patient Care Team: Shirline Frees, MD as PCP - General (Family Medicine)   Date of Service:  10/14/2018  CHIEF COMPLAINTS/PURPOSE OF CONSULTATION:  Newly Diagnosed Anal cancer    Anal cancer ([place])   03/19/2018 Genetic Testing    Genetic testing performed through [facility]'s Common Hereditary Cancers Panel reported out on 03/10/2018 showed no pathogenic mutations. The Common Hereditary Cancer Panel offered by [facility] includes sequencing and/or deletion duplication testing of the following 47 genes: APC, ATM, AXIN2, BARD1, BMPR1A, BRCA1, BRCA2, BRIP1, CDH1, CDKN2A (p14ARF), CDKN2A (p16INK4a), CKD4, CHEK2, CTNNA1, DICER1, EPCAM (Deletion/duplication testing only), GREM1 (promoter region deletion/duplication testing only), KIT, MEN1, MLH1, MSH2, MSH3, MSH6, MUTYH, NBN, NF1, NHTL1, PALB2, PDGFRA, PMS2, POLD1, POLE, PTEN, RAD50, RAD51C, RAD51D, SDHB, SDHC, SDHD, SMAD4, SMARCA4. STK11, TP53, TSC1, TSC2, and VHL.  The following genes were evaluated for sequence changes only: SDHA and HOXB13 c.251G>A variant only.  A variant of uncertain significance (VUS) in a gene called PMS2 was also noted. c.1399G>A (p.Val467Ile)    08/14/2018 Imaging    MRI PELVIS W WO Contrast 08/14/18 IMPRESSION: 1. Centrally necrotic 1.8 cm enlarged lymph node versus nonspecific soft tissue mass in the left hemipelvis adjacent to the posterior pelvic sidewall. Further evaluation with biopsy and/or PET-CT is recommended.    09/17/2018 Pathology Results    Diagnosis 09/17/18 Lymph node, needle/core biopsy, left pelvic - SQUAMOUS CELL CARCINOMA. - SEE COMMENT. Microscopic Comment    10/09/2018 PET scan    PET 10/09/18  IMPRESSION: 1. 17 mm left pelvic sidewall lesion is markedly hypermetabolic compatible with known malignancy. 2. No additional definite hypermetabolic disease identified in the neck, chest, abdomen, or  pelvis. 3. Relatively diffuse FDG accumulation in the colon without focal increased uptake above background colonic levels in the region of the anus. 4. Focal FDG accumulation identified along the anterior left femoral neck and in the lumbosacral junction. No underlying bone lesions evident at these locations on CT images. Indeterminate finding although metastatic disease cannot be excluded.     10/13/2018 Initial Biopsy    Diagnosis 10/13/18 Anus, biopsy - SQUAMOUS CELL CARCINOMA. - SEE COMMENT.    10/14/2018 Initial Diagnosis    Anal cancer ([place])     Chemotherapy    Concurrent chemoRt with Mitomycin and 5-FU on week 1 and week 5     Radiation Therapy    Concurrent chemoRt with Dr. Lisbeth Renshaw for 5-6 weeks     10/14/2018 Cancer Staging    Staging form: Anus, AJCC 8th Edition - Clinical stage from 10/14/2018: Stage IIIA (cT2, cN1b, cM0) - Signed by Truitt Merle, MD on 10/14/2018    11/02/2018 -  Chemotherapy    The patient had mitoMYcin (MUTAMYCIN) chemo injection 17.5 mg, 10 mg/m2, Intravenous,  Once, 0 of 1 cycle fluorouracil (ADRUCIL) 7,000 mg in sodium chloride 0.9 % 110 mL chemo infusion, 1,000 mg/m2/day, Intravenous, 4D (96 hours ), 0 of 1 cycle  for chemotherapy treatment.      HISTORY OF PRESENTING ILLNESS:   Angelica Wright 62 y.o. female is a here because of Newly diagnosed anal cancer. The patient was referred by GI Dr. Gerarda Fraction. The patient presents to the clinic today accompanied by her husband.  She notes due to recurrent kidney stone she will have occasional hematuria only noticed in urinalysis. She denies back pain, pelvic pain or dysuria. She had recent hematuria and went to nephrologist. Scan showed uptake in lymph node  so they referred her to have biopsy which showed squamous cell carcinoma. She was seen yb Dr. Marcello Moores who competed her biopsy which confirm anal cancer primary site.   Today she notes her buttocks is fine post biopsy. She notes her BM or either  constipation or diarrhea. Now her diarrhea is only triggered by the foods she eats such as salads. This has not changed lately. She notes having hemorrhoids so she will have blood on tissue at times. No blood in toilet. She notes having gas often but no change in that for her. She notes having weight loss about 13 pounds but also has been exercising. She notes not sleeping adequately which is her baseline. She will fall asleep early and waking up in the night. She gets about 5 hours of sleep.   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 Her family is in [place]. She is a non-smoker and drinks occasionally.   They have a PMHx of DM with 107-130 in the AM preprandial. She also has HTN and osteoporosis. She had parathyroid surgery in the past.  Her mother recently passed this year due to endometrial cancer and had recurrent breast cancer. Her father had liver and lung cancer. 3 aunts had breast cancer. Her causing had breast cancer.    REVIEW OF SYSTEMS:  Constitutional: Denies fevers, chills or abnormal night sweats (+) trouble sleeping (+) weight loss, some purposeful  Eyes: Denies blurriness of vision, double vision or watery eyes Ears, nose, mouth, throat, and face: Denies mucositis or sore throat Respiratory: Denies cough, dyspnea or wheezes Cardiovascular: Denies palpitation, chest discomfort or lower extremity swelling Gastrointestinal:  Denies nausea, heartburn (+) occasional diarrhea, stable (+) hemorrhoids, with blood on tissue (+) gas Skin: Denies abnormal skin rashes Lymphatics: Denies new lymphadenopathy or easy bruising Neurological:Denies numbness, tingling or new weaknesses Behavioral/Psych: Mood is stable, no new changes  All other systems were reviewed with the patient and are negative.   MEDICAL HISTORY:  Past Medical History:  Diagnosis Date  . Hyperparathyroidism ([place])    resolved after surgery    . Hypertension   . Osteoporosis    of the spine  . Rectocele   . TMJ syndrome   . Type 2 Diabetes ([place]) 11/2013   controlled with diet/exer    SURGICAL HISTORY: Past Surgical History:  Procedure Laterality Date  . PARATHYROIDECTOMY  2000  . THERAPEUTIC ABORTION     x 2  . VULVA /PERINEUM BIOPSY  2012    SOCIAL HISTORY: Social History   Socioeconomic History  . Marital status: Married    Spouse name: Not on file  . Number of children: Not on file  . Years of education: Not on file  . Highest education level: Not on file  Occupational History  . Not on file  Social Needs  . Financial resource strain: Not on file  . Food insecurity:    Worry: Not on file    Inability: Not on file  . Transportation needs:    Medical: Not on file    Non-medical: Not on file  Tobacco Use  . Smoking status: Never Smoker  . Smokeless tobacco: Never Used  Substance and Sexual Activity  . Alcohol use: Yes    Alcohol/week: 2.0 standard drinks    Types: 2 Standard drinks or equivalent per week    Comment: 1-2 per week  . Drug use: No  . Sexual activity: Yes  Partners: Male    Birth control/protection: Other-see comments    Comment: vasectomy  Lifestyle  . Physical activity:    Days per week: Not on file    Minutes per session: Not on file  . Stress: Not on file  Relationships  . Social connections:    Talks on phone: Not on file    Gets together: Not on file    Attends religious service: Not on file    Active member of club or organization: Not on file    Attends meetings of clubs or organizations: Not on file    Relationship status: Not on file  . Intimate partner violence:    Fear of current or ex partner: Not on file    Emotionally abused: Not on file    Physically abused: Not on file    Forced sexual activity: Not on file  Other Topics Concern  . Not on file  Social History Narrative  . Not on file    FAMILY HISTORY: Family History  Problem Relation Age of Onset   . Cancer Mother 36       uterine (thinks)  . Breast cancer Mother 43       lumpectomy, x2 in other breast in late 44's  . Breast cancer Maternal Aunt        dx in early 82's, x2 in late 80's  . Breast cancer Paternal Aunt 63  . Breast cancer Paternal Aunt 85  . Cirrhosis Father        ETOH related  . Liver cancer Father   . Lung cancer Father        tobacco user  . Stroke Maternal Grandmother   . Stroke Maternal Grandfather   . Emphysema Paternal Grandmother   . Diabetes Paternal Grandfather   . Breast cancer Cousin 32    ALLERGIES:  is allergic to sulfa antibiotics.  MEDICATIONS:  Current Outpatient Medications  Medication Sig Dispense Refill  . aspirin 81 MG tablet Take 81 mg by mouth every evening.     Marland Kitchen atenolol (TENORMIN) 50 MG tablet Take 50 mg by mouth every evening.     . calcium carbonate (TUMS - DOSED IN MG ELEMENTAL CALCIUM) 500 MG chewable tablet Chew 1 tablet by mouth daily as needed for indigestion or heartburn.    . cholecalciferol (VITAMIN D3) 25 MCG (1000 UT) tablet Take 2,000 Units by mouth every evening.    . clobetasol ointment (TEMOVATE) 0.05 % Apply a pea sized amount topically BID x 1-2 weeks prn flare (Patient taking differently: Apply 1 application topically 2 (two) times daily as needed (outbreaks). Use for 1 to 2 weeks) 30 g 1  . Coenzyme Q10 (CO Q 10) 100 MG CAPS Take 100 mg by mouth daily.     . hydrochlorothiazide (HYDRODIURIL) 12.5 MG tablet Take 12.5 mg by mouth daily.     Marland Kitchen ibuprofen (ADVIL,MOTRIN) 100 MG tablet Take 400-600 mg by mouth 2 (two) times daily as needed for pain.    . Magnesium 250 MG TABS Take 250 mg by mouth every evening.    . Multiple Vitamin (MULTIVITAMIN WITH MINERALS) TABS tablet Take 1 tablet by mouth daily.    . SUPER B COMPLEX/C PO Take 1 tablet by mouth daily.    . vitamin C (ASCORBIC ACID) 500 MG tablet Take 500 mg by mouth every evening.    . valACYclovir (VALTREX) 500 MG tablet Take one tab po BID for 3 days with  flares. (Patient not taking: Reported  on 10/14/2018) 30 tablet 1   No current facility-administered medications for this visit.     PHYSICAL EXAMINATION: ECOG PERFORMANCE STATUS: 0 - Asymptomatic  Vitals:   10/14/18 1455  BP: (!) 157/84  Pulse: (!) 114  Resp: 20  Temp: 98.7 F (37.1 C)  SpO2: 98%   Filed Weights   10/14/18 1455  Weight: 157 lb 9.6 oz (71.5 kg)    GENERAL:alert, no distress and comfortable SKIN: skin color, texture, turgor are normal, no rashes or significant lesions (+) left lower anterior leg skin lesion, possibly eczema  EYES: normal, conjunctiva are pink and non-injected, sclera clear OROPHARYNX:no exudate, no erythema and lips, buccal mucosa, and tongue normal  NECK: supple, thyroid normal size, non-tender, without nodularity LYMPH:  no palpable lymphadenopathy in the cervical, axillary or inguinal LUNGS: clear to auscultation and percussion with normal breathing effort HEART: regular rate & rhythm and no murmurs and no lower extremity edema ABDOMEN:abdomen soft, non-tender and normal bowel sounds Musculoskeletal:no cyanosis of digits and no clubbing  PSYCH: alert & oriented x 3 with fluent speech NEURO: no focal motor/sensory deficits RECTAL EXAM: (+) small lesion 2cm above left posterior anal verge, no blood on tip of glove.    LABORATORY DATA:  I have reviewed the data as listed CBC Latest Ref Rng & Units 09/17/2018 04/22/2014 03/12/2013  WBC 4.0 - 10.5 K/uL 7.5 - -  Hemoglobin 12.0 - 15.0 g/dL 14.4 14.1 13.6  Hematocrit 36.0 - 46.0 % 42.2 - -  Platelets 150 - 400 K/uL 299 - -   No flowsheet data found.   PATHOLOGY  Diagnosis 10/13/18 Anus, biopsy - SQUAMOUS CELL CARCINOMA. - SEE COMMENT.   Diagnosis 09/17/18 Lymph node, needle/core biopsy, left pelvic - SQUAMOUS CELL CARCINOMA. - SEE COMMENT. Microscopic Comment   RADIOGRAPHIC STUDIES: I have personally reviewed the radiological images as listed and agreed with the findings in the  report. Nm Pet Image Initial (pi) Skull Base To Thigh  Result Date: 10/09/2018 CLINICAL DATA:  Initial treatment strategy for squamous cell carcinoma of the anal canal. EXAM: NUCLEAR MEDICINE PET SKULL BASE TO THIGH TECHNIQUE: 8 mCi F-18 FDG was injected intravenously. Full-ring PET imaging was performed from the skull base to thigh after the radiotracer. CT data was obtained and used for attenuation correction and anatomic localization. Fasting blood glucose: 124 mg/dl COMPARISON:  CT scan 07/29/2018.  MRI pelvis 08/13/2018. FINDINGS: Mediastinal blood pool activity: SUV max 2.2 NECK: No hypermetabolic lymph nodes in the neck. Incidental CT findings: none CHEST: No hypermetabolic mediastinal or hilar nodes. No suspicious pulmonary nodules on the CT scan. Incidental CT findings: none ABDOMEN/PELVIS: No abnormal hypermetabolic activity within the liver, pancreas, adrenal glands, or spleen. No hypermetabolic lymph nodes in the abdomen or pelvis. The known 17 mm left pelvic sidewall lesion is markedly hypermetabolic with SUV max = 8.5. Relatively diffuse uptake is identified in the colon, nonspecific with no convincing increased uptake in the anal region. Incidental CT findings: Diffuse fatty deposition noted within the liver parenchyma. 3.9 cm simple cyst identified inferior right liver adjacent to the gallbladder fossa. Diffuse diverticulosis noted in the colon without diverticulitis SKELETON: Mottled FDG accumulation is identified diffusely in the marrow. There is more focal hypermetabolism along the anterior cortex of the left femoral neck without underlying bony lesion evident. Foci of mild hypermetabolism also noted in the posterior L5 vertebral body and along the left L5-S1 interspace. Incidental CT findings: No worrisome lytic or sclerotic osseous abnormality. IMPRESSION: 1. 17 mm left pelvic  sidewall lesion is markedly hypermetabolic compatible with known malignancy. 2. No additional definite hypermetabolic  disease identified in the neck, chest, abdomen, or pelvis. 3. Relatively diffuse FDG accumulation in the colon without focal increased uptake above background colonic levels in the region of the anus. 4. Focal FDG accumulation identified along the anterior left femoral neck and in the lumbosacral junction. No underlying bone lesions evident at these locations on CT images. Indeterminate finding although metastatic disease cannot be excluded. Electronically Signed   By: Misty Stanley M.D.   On: 10/09/2018 15:00   Ct Biopsy  Result Date: 09/17/2018 CLINICAL DATA:  Deep left pelvic mass versus adenopathy EXAM: CT GUIDED CORE BIOPSY OF LEFT PELVIC NODULE ANESTHESIA/SEDATION: Intravenous Fentanyl and Versed were administered as conscious sedation during continuous monitoring of the patient's level of consciousness and physiological / cardiorespiratory status by the radiology RN, with a total moderate sedation time of 10 minutes. PROCEDURE: The procedure risks, benefits, and alternatives were explained to the patient. Questions regarding the procedure were encouraged and answered. The patient understands and consents to the procedure. Patient placed prone. Select axial scans through the pelvis were obtained. The lesion was localized and an appropriate skin entry site was determined and marked. The operative field was prepped with chlorhexidinein a sterile fashion, and a sterile drape was applied covering the operative field. A sterile gown and sterile gloves were used for the procedure. Local anesthesia was provided with 1% Lidocaine. Under CT fluoroscopic guidance, a 17 gauge trocar needle was advanced to the margin of the lesion. Once needle tip position was confirmed, coaxial 18-gauge core biopsy samples were obtained, submitted in saline to surgical pathology. The guide needle was removed. Postprocedure scans show minimal regional hemorrhage. Patient remained asymptomatic and hemodynamically stable.  COMPLICATIONS: None immediate FINDINGS: Deep left pelvic nodule was again localized. Representative core biopsy samples obtained as above. IMPRESSION: 1. Technically successful CT-guided core biopsy, left pelvic nodule Electronically Signed   By: Lucrezia Europe M.D.   On: 09/17/2018 14:26    ASSESSMENT & PLAN:  Angelica Wright is a 62 y.o. Caucasian female with a history of Hyperparathyroidism, HTN, Osteoporosis and DM.   1. Anal Cancer, with positive left pelvic lymph node, cT2N1bM0, stage IIIA -I reviewed and discussed her image findings and biopsy results with pt and her husband in great detail. Images reviewed in person.  -She has locally advanced squamous cell carcinoma of the anus (in anal canal and low rectum) which has spread to pelvic lymph node.  -Her PET scan showed no other distant metastasis in the chest, abdomen or pelvis. Her primary anal mass was not hypermetabolic on PET scan.  Biopsy of the anal mass showed squamous cell carcinoma. -I discussed standard treatment is concurrent Chemoradiation for 6 weeks with Mitomycin and 5-FU on week 1 and week 5.   --Chemotherapy consent: Side effects including but does not limited to, fatigue, nausea, vomiting, diarrhea, hair loss, neuropathy, fluid retention, renal and kidney dysfunction, neutropenic fever, needed for blood transfusion, bleeding, coronary artery spasm, heart attack, heart failure, were discussed with patient in great detail. She agrees to proceed. -For therapy is curative.  We discussed the cure rate is 70 to 80% -will refer her to rad/onc Dr. Lisbeth Renshaw  -Surgery is not the primary treatment but possible if she has residual or recurrent cancer after chemoRt. She has been seen by Dr. Marcello Moores   -I recommend Picc line to use for chemo treatment. She is interested.  -She will proceed with  chemo education class before starting treatment.  -Will scan after treatment to monitor her response. -She has a strong family history of breast cancer  in her family. Her 03/2018 genetic testing was negative except for VUS of gene PMS2.  -I reviewed the [facility] resources available to her such as dietician and Education officer, museum and Psychologist, forensic.  -F/u in 3 weeks.  Patient would like to start chemotherapy after Christmas.   2. HTN and DM -She is on atenolol and HCTZ, not on diabetic medication -Managed by her PCP -We discussed her blood pressure and blood glucose may change after chemotherapy, and will follow-up closely.    PLAN:  -Rad/onc referral to Dr. Lisbeth Renshaw -Chemo class and lab in the next 1-2 -PICC line on 12/30, lab, f/u and chemo 5-fu and mitomycin then lab and f/u with me or APP weekly X5 after that      Orders Placed This Encounter  Procedures  . IR Fluoro Guide CV Line Left    Indicate type of CVC ordering, please schedule it for the morning of 12/30    Standing Status:   Future    Standing Expiration Date:   12/16/2019    Order Specific Question:   Reason for exam:    Answer:   PICC line insertion for CHEMO    Order Specific Question:   Preferred Imaging Location?    Answer:   [facility]    All questions were answered. The patient knows to call the clinic with any problems, questions or concerns. I spent 55 minutes counseling the patient face to face. The total time spent in the appointment was 60 minutes and more than 50% was on counseling.     Truitt Merle, MD 10/14/2018 9:43 PM  I, Joslyn Devon, am acting as scribe for Truitt Merle, MD.   I have reviewed the above documentation for accuracy and completeness, and I agree with the above.

## 2018-10-14 NOTE — Progress Notes (Signed)
START ON PATHWAY REGIMEN - Anal Carcinoma     Chemotherapy concurrent with RT:     Mitomycin      5-Fluorouracil   **Always confirm dose/schedule in your pharmacy ordering system**  Patient Characteristics: Anal and Anal Margin Tumors, Newly Diagnosed - Locoregional Disease Not Amenable to Local Excision AJCC T Category: T2 AJCC N Category: N1b AJCC M Category: M0 AJCC 8 Stage Grouping: IIIA Current Disease Status: Newly Diagnosed - Locoregional Disease Not Amendable to Local Excision Intent of Therapy: Curative Intent, Discussed with Patient

## 2018-10-14 NOTE — Telephone Encounter (Signed)
Printed calendar and avs. °

## 2018-10-17 NOTE — Progress Notes (Signed)
  Oncology Nurse Navigator Documentation     Called and spoke with Mr. Crumble to see if they had any questions after their initial consult with Dr. Burr Medico. They are looking forward to the chemo education class on 1218/19. Patient has my contact information provided at initial consult. I encouraged Mr. Liguori to reach out for support or questions. I will follow as needed.

## 2018-10-21 ENCOUNTER — Other Ambulatory Visit: Payer: Self-pay | Admitting: Hematology

## 2018-10-21 DIAGNOSIS — C210 Malignant neoplasm of anus, unspecified: Secondary | ICD-10-CM

## 2018-10-21 MED ORDER — PROCHLORPERAZINE MALEATE 10 MG PO TABS: 10.0000 mg | ORAL_TABLET | Freq: Four times a day (QID) | ORAL | 1 refills | Status: DC | PRN

## 2018-10-21 MED ORDER — ONDANSETRON HCL 8 MG PO TABS: 8.0000 mg | ORAL_TABLET | Freq: Two times a day (BID) | ORAL | 1 refills | Status: DC | PRN

## 2018-10-22 ENCOUNTER — Inpatient Hospital Stay: Payer: 59

## 2018-10-22 ENCOUNTER — Telehealth: Payer: Self-pay | Admitting: Hematology

## 2018-10-22 DIAGNOSIS — C210 Malignant neoplasm of anus, unspecified: Secondary | ICD-10-CM

## 2018-10-22 LAB — CBC WITH DIFFERENTIAL (CANCER CENTER ONLY)
Abs Immature Granulocytes: 0.02 10*3/uL (ref 0.00–0.07)
Basophils Absolute: 0 10*3/uL (ref 0.0–0.1)
Basophils Relative: 0 %
Eosinophils Absolute: 0.1 10*3/uL (ref 0.0–0.5)
Eosinophils Relative: 1 %
HCT: 41.4 % (ref 36.0–46.0)
Hemoglobin: 13.9 g/dL (ref 12.0–15.0)
Immature Granulocytes: 0 %
Lymphocytes Relative: 19 %
Lymphs Abs: 1.4 10*3/uL (ref 0.7–4.0)
MCH: 31.7 pg (ref 26.0–34.0)
MCHC: 33.6 g/dL (ref 30.0–36.0)
MCV: 94.5 fL (ref 80.0–100.0)
MONO ABS: 0.5 10*3/uL (ref 0.1–1.0)
Monocytes Relative: 6 %
Neutro Abs: 5.4 10*3/uL (ref 1.7–7.7)
Neutrophils Relative %: 74 %
Platelet Count: 313 10*3/uL (ref 150–400)
RBC: 4.38 MIL/uL (ref 3.87–5.11)
RDW: 12.7 % (ref 11.5–15.5)
WBC Count: 7.4 10*3/uL (ref 4.0–10.5)
nRBC: 0 % (ref 0.0–0.2)

## 2018-10-22 LAB — CMP (CANCER CENTER ONLY)
ALBUMIN: 4.2 g/dL (ref 3.5–5.0)
ALK PHOS: 92 U/L (ref 38–126)
ALT: 42 U/L (ref 0–44)
AST: 27 U/L (ref 15–41)
Anion gap: 9 (ref 5–15)
BUN: 15 mg/dL (ref 8–23)
CALCIUM: 10 mg/dL (ref 8.9–10.3)
CHLORIDE: 107 mmol/L (ref 98–111)
CO2: 27 mmol/L (ref 22–32)
CREATININE: 0.81 mg/dL (ref 0.44–1.00)
GFR, Est AFR Am: >60 mL/min (ref >60)
GFR, Estimated: >60 mL/min (ref >60)
GLUCOSE: 110 mg/dL — AB (ref 70–99)
Potassium: 4.4 mmol/L (ref 3.5–5.1)
SODIUM: 143 mmol/L (ref 135–145)
Total Bilirubin: 0.4 mg/dL (ref 0.3–1.2)
Total Protein: 7.8 g/dL (ref 6.5–8.1)

## 2018-10-22 NOTE — Telephone Encounter (Signed)
Spoke with patient about appointment.  Patient stated she will come in tomorrow to get a copy of her appointment calendar.

## 2018-10-22 NOTE — Progress Notes (Signed)
GI Location of Tumor / Histology: Anal Mass  Angelica Wright presented with hematuria.  She saw her nephrologist who ordered a scan.  The scan showed uptake in lymph node so they referred her to have biopsy which showed squamous cell carcinoma.  She was seen by Dr. Marcello Moores who completed her biopsy which confirm anal cancer primary site.  PET 10/09/2018: 17 mm left pelvis sidewall lesion is markedly hypermetabolic compatible with known malignancy.  No additional definite hypermetabolic disease identified in the neck, chest, abdomen, or pelvis.  Focal FDG accumulation identified along the anterior left femoral neck and in the lumbosacral junction.  No underlying bone lesions evident at these locations on CT images.  Indeterminate finding although metastatic disease cannot be excluded.    MRI Pelvis 08/14/2018: Centrally necrotic 1.8 cm enlarged lymph node versus nonspecific soft tissue mass in the left hemipelvis adjacent to the posterior pelvic sidewall.    Biopsies of Anus 10/13/2018   Biopsy left pelvic lymph node 09/17/2018   Past/Anticipated interventions by surgeon, if any:    Past/Anticipated interventions by medical oncology, if any:  Dr. Burr Medico 10/14/2018 -She has locally advanced squamous cell carcinoma of the anus which has spread to pelvic lymph node. -I discussed standard treatment is concurrent chemoradiation for 6 weeks with mitomycin and 5-FU on week 1 and week 5. -She agrees to proceed with chemotherapy.  Chemo is curative, we discussed the cure rate is 70-80%. -Will refer her to radiation oncology Dr. Lisbeth Renshaw. -Surgery is not the primary treatment but is possible if she has residual or recurrent cancer after chemoRT.  She has been seen by Dr. Marcello Moores. -Follow-up in 3 weeks, patient would like to start chemotherapy after Christmas. -Chemo start 11/03/2018   Weight changes, if any: No  Bowel/Bladder complaints, if any: No  Nausea / Vomiting, if any: No  Pain issues, if any:   No  Any blood per rectum: No  BP (!) 146/98 (BP Location: Left Arm, Patient Position: Sitting)   Pulse 71   Temp 98.5 F (36.9 C) (Oral)   Resp 18   Ht 5\' 1"  (1.549 m)   Wt 159 lb 2 oz (72.2 kg)   LMP 11/05/2002 (Approximate)   SpO2 98%   BMI 30.07 kg/m    Wt Readings from Last 3 Encounters:  10/23/18 159 lb 2 oz (72.2 kg)  10/14/18 157 lb 9.6 oz (71.5 kg)  09/29/18 160 lb (72.6 kg)   SAFETY ISSUES:  Prior radiation? No  Pacemaker/ICD? No  Possible current pregnancy? No  Is the patient on methotrexate? No  Current Complaints/Details: -Strong family history of cancers. -Genetic Testing 03/19/2018-Testing performed through [facility] common hereditary cancers panel showed no pathogenic mutations.

## 2018-10-23 ENCOUNTER — Encounter: Payer: Self-pay | Admitting: Radiation Oncology

## 2018-10-23 ENCOUNTER — Encounter: Payer: Self-pay | Admitting: General Practice

## 2018-10-23 ENCOUNTER — Ambulatory Visit
Admission: RE | Admit: 2018-10-23 | Discharge: 2018-10-23 | Disposition: A | Discharge status: Home / Self Care | Payer: 59 | Source: Ambulatory Visit | Attending: Radiation Oncology | Admitting: Radiation Oncology

## 2018-10-23 VITALS — BP 146/98 | HR 71 | Temp 98.5°F | Resp 18 | Ht 61.0 in | Wt 159.1 lb

## 2018-10-23 DIAGNOSIS — E119 Type 2 diabetes mellitus without complications: Secondary | ICD-10-CM | POA: Diagnosis not present

## 2018-10-23 DIAGNOSIS — Z7982 Long term (current) use of aspirin: Secondary | ICD-10-CM | POA: Insufficient documentation

## 2018-10-23 DIAGNOSIS — Z803 Family history of malignant neoplasm of breast: Secondary | ICD-10-CM | POA: Insufficient documentation

## 2018-10-23 DIAGNOSIS — I10 Essential (primary) hypertension: Secondary | ICD-10-CM | POA: Diagnosis not present

## 2018-10-23 DIAGNOSIS — C210 Malignant neoplasm of anus, unspecified: Secondary | ICD-10-CM

## 2018-10-23 DIAGNOSIS — Z8041 Family history of malignant neoplasm of ovary: Secondary | ICD-10-CM | POA: Diagnosis not present

## 2018-10-23 DIAGNOSIS — Z79899 Other long term (current) drug therapy: Secondary | ICD-10-CM | POA: Insufficient documentation

## 2018-10-23 DIAGNOSIS — Z808 Family history of malignant neoplasm of other organs or systems: Secondary | ICD-10-CM | POA: Diagnosis not present

## 2018-10-23 DIAGNOSIS — Z801 Family history of malignant neoplasm of trachea, bronchus and lung: Secondary | ICD-10-CM | POA: Diagnosis not present

## 2018-10-23 DIAGNOSIS — C775 Secondary and unspecified malignant neoplasm of intrapelvic lymph nodes: Secondary | ICD-10-CM | POA: Diagnosis not present

## 2018-10-23 DIAGNOSIS — M810 Age-related osteoporosis without current pathological fracture: Secondary | ICD-10-CM | POA: Insufficient documentation

## 2018-10-23 DIAGNOSIS — C211 Malignant neoplasm of anal canal: Secondary | ICD-10-CM | POA: Diagnosis not present

## 2018-10-23 LAB — HIV ANTIBODY (ROUTINE TESTING W REFLEX): HIV Screen 4th Generation wRfx: NONREACTIVE

## 2018-10-23 NOTE — Progress Notes (Signed)
[facility] Psychosocial Distress Screening Clinical Social Work  Clinical Social Work was referred by distress screening protocol.  The patient scored a 5 on the Psychosocial Distress Thermometer which indicates moderate distress. Clinical Social Worker contacted patient by phone to assess for distress and other psychosocial needs. Called patient, no answer, left VM however patient's VM requests that she be texted and does not check VM frequently - CSW cannot text so left VM w information about [place], contact information and encouragement to call back for help/resources if desired.    ONCBCN DISTRESS SCREENING 10/23/2018  Screening Type Initial Screening  Distress experienced in past week (1-10) 5  Practical problem type Work/school  Emotional problem type Depression;Nervousness/Anxiety;Adjusting to illness  Physical Problem type Sleep/insomnia  Other Reached via phone: [phone]    Clinical Social Worker follow up needed: No.  If yes, follow up plan:  Beverely Pace, [place], LCSW Clinical Social Worker Phone:  [phone]

## 2018-10-23 NOTE — Progress Notes (Signed)
Radiation Oncology         [phone] ________________________________  Name: Angelica Wright        MRN: [id number]  Date of Service: 10/23/2018 DOB: 06-30-1956  NT:IRWERX, Gwyndolyn Saxon, MD  Truitt Merle, MD     REFERRING PHYSICIAN: Truitt Merle, MD   DIAGNOSIS: The encounter diagnosis was Anal cancer [facility]).   HISTORY OF PRESENT ILLNESS: Angelica Wright is a 62 y.o. female seen at the request of Dr. Burr Medico for newly diagnosed anal carcinoma.  The patient was found to have hematuria, and a work-up with urology began including CT imaging on 07/19/2018 which revealed a 3 mm stone in the inferior left kidney no renal masses, the ureters were not well opacified, and within the left hemipelvis there was a 1.9 x 1.8 cm enhancing mass.  This led to an MRI scan of the pelvis on 08/13/2018 which confirmed an 18 x 18 mm centrally necrotic rim-enhancing mass in the left hemipelvis adjacent to the posterior pelvic sidewall.  No additional masses were noted of the reproductive tract.  She underwent a CT-guided biopsy on 09/17/2018 that revealed a squamous cell carcinoma.  She was referred to GYN oncology for further work-up.  She had had prior Pap smear in March 2019 which was negative, also negative for high risk HPV.  No other gynecologic abnormalities were noted however she did have a lesion in the anal region of suspicion.  She underwent a PET scan on 10/09/2018 revealing the left pelvic sidewall lesion with hypermetabolism and an SUV measuring 8.5.  There was no convincing increased uptake in the anal region.  She also met with Dr. Marcello Moores, and underwent anal biopsy in the office which revealed squamous cell carcinoma.  She has met with Dr. Renaee Munda, and has discussed definitive treatment with chemoradiation.  She comes today to discuss this with Dr. Lisbeth Renshaw.    PREVIOUS RADIATION THERAPY: No   PAST MEDICAL HISTORY:  Past Medical History:  Diagnosis Date  . Hyperparathyroidism ([place])    resolved after surgery  .  Hypertension   . Osteoporosis    of the spine  . Rectocele   . TMJ syndrome   . Type 2 Diabetes ([place]) 11/2013   controlled with diet/exer       PAST SURGICAL HISTORY: Past Surgical History:  Procedure Laterality Date  . PARATHYROIDECTOMY  2000  . THERAPEUTIC ABORTION     x 2  . VULVA /PERINEUM BIOPSY  2012     FAMILY HISTORY:  Family History  Problem Relation Age of Onset  . Cancer Mother 80       endom  . Breast cancer Mother 66       lumpectomy, x2 in other breast in late 41's  . Breast cancer Maternal Aunt        dx in early 44's, x2 in late 80's  . Breast cancer Paternal Aunt 57  . Breast cancer Paternal Aunt 85  . Cirrhosis Father        ETOH related  . Liver cancer Father   . Lung cancer Father        tobacco user  . Stroke Maternal Grandmother   . Stroke Maternal Grandfather   . Emphysema Paternal Grandmother   . Diabetes Paternal Grandfather   . Breast cancer Cousin 74     SOCIAL HISTORY:  reports that she has never smoked. She has never used smokeless tobacco. She reports current alcohol use of about 2.0 standard drinks of alcohol per week.  She reports that she does not use drugs. The patient is married and lives in [place]. 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    ALLERGIES: Sulfa antibiotics   MEDICATIONS:  Current Outpatient Medications  Medication Sig Dispense Refill  . aspirin 81 MG tablet Take 81 mg by mouth every evening.     Marland Kitchen atenolol (TENORMIN) 50 MG tablet Take 50 mg by mouth every evening.     . calcium carbonate (TUMS - DOSED IN MG ELEMENTAL CALCIUM) 500 MG chewable tablet Chew 1 tablet by mouth daily as needed for indigestion or heartburn.    . cholecalciferol (VITAMIN D3) 25 MCG (1000 UT) tablet Take 2,000 Units by mouth every evening.    . clobetasol ointment (TEMOVATE) 0.05 % Apply a pea sized amount topically BID x 1-2 weeks prn flare (Patient taking differently: Apply 1 application topically 2  (two) times daily as needed (outbreaks). Use for 1 to 2 weeks) 30 g 1  . Coenzyme Q10 (CO Q 10) 100 MG CAPS Take 100 mg by mouth daily.     . hydrochlorothiazide (HYDRODIURIL) 12.5 MG tablet Take 12.5 mg by mouth daily.     Marland Kitchen ibuprofen (ADVIL,MOTRIN) 100 MG tablet Take 400-600 mg by mouth 2 (two) times daily as needed for pain.    . Magnesium 250 MG TABS Take 250 mg by mouth every evening.    . Multiple Vitamin (MULTIVITAMIN WITH MINERALS) TABS tablet Take 1 tablet by mouth daily.    . ondansetron (ZOFRAN) 8 MG tablet Take 1 tablet (8 mg total) by mouth 2 (two) times daily as needed (Nausea or vomiting). 30 tablet 1  . prochlorperazine (COMPAZINE) 10 MG tablet Take 1 tablet (10 mg total) by mouth every 6 (six) hours as needed (Nausea or vomiting). 30 tablet 1  . SUPER B COMPLEX/C PO Take 1 tablet by mouth daily.    . vitamin C (ASCORBIC ACID) 500 MG tablet Take 500 mg by mouth every evening.    . valACYclovir (VALTREX) 500 MG tablet Take one tab po BID for 3 days with flares. (Patient not taking: Reported on 10/14/2018) 30 tablet 1   No current facility-administered medications for this encounter.      REVIEW OF SYSTEMS: On review of systems, the patient reports that she is doing well overall. She denies any significant rectal bleeding or rectal pain. She denies any chest pain, shortness of breath, cough, fevers, chills, night sweats, unintended weight changes. She denies any bladder disturbances, and denies abdominal pain, nausea or vomiting. She denies any new musculoskeletal or joint aches or pains. A complete review of systems is obtained and is otherwise negative.     PHYSICAL EXAM:  Wt Readings from Last 3 Encounters:  10/23/18 159 lb 2 oz (72.2 kg)  10/14/18 157 lb 9.6 oz (71.5 kg)  09/29/18 160 lb (72.6 kg)   Temp Readings from Last 3 Encounters:  10/23/18 98.5 F (36.9 C) (Oral)  10/14/18 98.7 F (37.1 C) (Oral)  09/29/18 98.1 F (36.7 C) (Oral)   BP Readings from Last 3  Encounters:  10/23/18 (!) 146/98  10/14/18 (!) 157/84  09/29/18 (!) 153/67   Pulse Readings from Last 3 Encounters:  10/23/18 71  10/14/18 (!) 114  09/29/18 85   Pain Assessment Pain Score: 0-No pain/10  In general this is a well appearing caucasian female in no acute distress. She is alert and oriented x4 and appropriate throughout the examination. HEENT reveals that the  patient is normocephalic, atraumatic. EOMs are intact.  Skin is intact without any evidence of gross lesions. Cardiopulmonary assessment is negative for acute distress and she exhibits normal effort. Lymphatic assessment is performed and does not reveal any adenopathy in the cervical, supraclavicular, axillary, or inguinal chains. Assessment of the perineum reveals no visible lesion externally. DRE is deferred.  ECOG = 1  0 - Asymptomatic (Fully active, able to carry on all predisease activities without restriction)  1 - Symptomatic but completely ambulatory (Restricted in physically strenuous activity but ambulatory and able to carry out work of a light or sedentary nature. For example, light housework, office work)  2 - Symptomatic, <50% in bed during the day (Ambulatory and capable of all self care but unable to carry out any work activities. Up and about more than 50% of waking hours)  3 - Symptomatic, >50% in bed, but not bedbound (Capable of only limited self-care, confined to bed or chair 50% or more of waking hours)  4 - Bedbound (Completely disabled. Cannot carry on any self-care. Totally confined to bed or chair)  5 - Death   Eustace Pen MM, Creech RH, Tormey DC, et al. [phone]). "Toxicity and response criteria of the [facility]". [place] Oncol. 5 (6): 649-55    LABORATORY DATA:  Lab Results  Component Value Date   WBC 7.4 10/22/2018   HGB 13.9 10/22/2018   HCT 41.4 10/22/2018   MCV 94.5 10/22/2018   PLT 313 10/22/2018   Lab Results  Component Value Date   NA 143 10/22/2018     K 4.4 10/22/2018   CL 107 10/22/2018   CO2 27 10/22/2018   Lab Results  Component Value Date   ALT 42 10/22/2018   AST 27 10/22/2018   ALKPHOS 92 10/22/2018   BILITOT 0.4 10/22/2018      RADIOGRAPHY: Nm Pet Image Initial (pi) Skull Base To Thigh  Result Date: 10/09/2018 CLINICAL DATA:  Initial treatment strategy for squamous cell carcinoma of the anal canal. EXAM: NUCLEAR MEDICINE PET SKULL BASE TO THIGH TECHNIQUE: 8 mCi F-18 FDG was injected intravenously. Full-ring PET imaging was performed from the skull base to thigh after the radiotracer. CT data was obtained and used for attenuation correction and anatomic localization. Fasting blood glucose: 124 mg/dl COMPARISON:  CT scan 07/29/2018.  MRI pelvis 08/13/2018. FINDINGS: Mediastinal blood pool activity: SUV max 2.2 NECK: No hypermetabolic lymph nodes in the neck. Incidental CT findings: none CHEST: No hypermetabolic mediastinal or hilar nodes. No suspicious pulmonary nodules on the CT scan. Incidental CT findings: none ABDOMEN/PELVIS: No abnormal hypermetabolic activity within the liver, pancreas, adrenal glands, or spleen. No hypermetabolic lymph nodes in the abdomen or pelvis. The known 17 mm left pelvic sidewall lesion is markedly hypermetabolic with SUV max = 8.5. Relatively diffuse uptake is identified in the colon, nonspecific with no convincing increased uptake in the anal region. Incidental CT findings: Diffuse fatty deposition noted within the liver parenchyma. 3.9 cm simple cyst identified inferior right liver adjacent to the gallbladder fossa. Diffuse diverticulosis noted in the colon without diverticulitis SKELETON: Mottled FDG accumulation is identified diffusely in the marrow. There is more focal hypermetabolism along the anterior cortex of the left femoral neck without underlying bony lesion evident. Foci of mild hypermetabolism also noted in the posterior L5 vertebral body and along the left L5-S1 interspace. Incidental CT  findings: No worrisome lytic or sclerotic osseous abnormality. IMPRESSION: 1. 17 mm left pelvic sidewall lesion is markedly hypermetabolic compatible  with known malignancy. 2. No additional definite hypermetabolic disease identified in the neck, chest, abdomen, or pelvis. 3. Relatively diffuse FDG accumulation in the colon without focal increased uptake above background colonic levels in the region of the anus. 4. Focal FDG accumulation identified along the anterior left femoral neck and in the lumbosacral junction. No underlying bone lesions evident at these locations on CT images. Indeterminate finding although metastatic disease cannot be excluded. Electronically Signed   By: Misty Stanley M.D.   On: 10/09/2018 15:00       IMPRESSION/PLAN: 1. Stage IIIA, cT2N1bM0 squamous cell carcinoma of the anus involving left pelvic sidewall node. Dr. Lisbeth Renshaw discusses the pathology findings and reviews the nature of squamous cell carcinomas of the anal canal.  He outlines the utility of radiotherapy along with chemosensitization in order to treat her disease.  We anticipate cure with this therapy. We discussed the risks, benefits, short, and long term effects of radiotherapy, and the patient is interested in proceeding. Dr. Lisbeth Renshaw discusses the delivery and logistics of radiotherapy and anticipates a course of 6 weeks of radiotherapy. Written consent is obtained and placed in the chart, a copy was provided to the patient. She will simulate following today's appointment with the plan to begin treatment 11/03/18.   In a visit lasting 60 minutes, greater than 50% of the time was spent face to face discussing her case, and coordinating the patient's care.  The above documentation reflects my direct findings during this shared patient visit. Please see the separate note by Dr. Lisbeth Renshaw on this date for the remainder of the patient's plan of care.    Carola Rhine, PAC

## 2018-10-30 NOTE — Progress Notes (Signed)
  Radiation Oncology         [phone] ________________________________  Name: Angelica Wright MRN: [id number]  Date: 10/23/2018  DOB: August 15, 1956  SIMULATION AND TREATMENT PLANNING NOTE   DIAGNOSIS:     ICD-10-CM   1. Anal cancer ([place]) C21.0      CONSENT VERIFIED: yes   SET UP: Patient is set-up supine   IMMOBILIZATION: The following immobilization is used: Customized VAC lock bag. This complex treatment device will be used on a daily basis during the patient's treatment.   Diagnosis: Anal cancer   NARRATIVE: The patient was brought to the [facility]. Identity was confirmed. All relevant records and images related to the planned course of therapy were reviewed. Then, the patient was positioned in a stable reproducible clinical set-up for radiation therapy using a customized vac lock bag. Skin markings were placed. The CT images were loaded into the planning software where the target and avoidance structures were contoured.The radiation prescription was entered and confirmed.   The patient will receive 54 Gy in 30 fractions to the high-dose target region.  Daily image guidance is ordered, and this will be used on a daily basis. This is necessary to ensure accurate and precise localization of the target in addition to accurate alignment of the normal tissue structures in this region. This is particularly important given the possible motion of the high-dose target.  Treatment planning then occurred.   I have requested : Intensity Modulated Radiotherapy (IMRT) is medically necessary for this case for the following reason: Dose homogeneity; the target is in close proximity to critical normal structures, including the femoral heads, bladder, and small bowel. IMRT is thus medically to appropriately treat the patient.    Special treatment procedure  The patient will receive chemotherapy during the course of radiation treatment. The patient may experience increased or  overlapping toxicity due to this combined-modality approach and the patient will be monitored for such problems. This may include extra lab  work as necessary. This therefore constitutes a special treatment procedure.     ________________________________  Jodelle Gross, MD, PhD

## 2018-10-31 DIAGNOSIS — C210 Malignant neoplasm of anus, unspecified: Secondary | ICD-10-CM | POA: Diagnosis not present

## 2018-10-31 NOTE — Progress Notes (Signed)
Angelica Wright   [phone] [phone]   Clinic Follow up Note   Patient Care Team: Shirline Frees, MD as PCP - General (Family Medicine)  Date of Service:  11/03/2018  CHIEF COMPLAINT: F/u of anal cancer  SUMMARY OF ONCOLOGIC HISTORY:   Anal cancer ([place])   03/19/2018 Genetic Testing    Genetic testing performed through [facility]'s Common Hereditary Cancers Panel reported out on 03/10/2018 showed no pathogenic mutations. The Common Hereditary Cancer Panel offered by [facility] includes sequencing and/or deletion duplication testing of the following 47 genes: APC, ATM, AXIN2, BARD1, BMPR1A, BRCA1, BRCA2, BRIP1, CDH1, CDKN2A (p14ARF), CDKN2A (p16INK4a), CKD4, CHEK2, CTNNA1, DICER1, EPCAM (Deletion/duplication testing only), GREM1 (promoter region deletion/duplication testing only), KIT, MEN1, MLH1, MSH2, MSH3, MSH6, MUTYH, NBN, NF1, NHTL1, PALB2, PDGFRA, PMS2, POLD1, POLE, PTEN, RAD50, RAD51C, RAD51D, SDHB, SDHC, SDHD, SMAD4, SMARCA4. STK11, TP53, TSC1, TSC2, and VHL.  The following genes were evaluated for sequence changes only: SDHA and HOXB13 c.251G>A variant only.  A variant of uncertain significance (VUS) in a gene called PMS2 was also noted. c.1399G>A (p.Val467Ile)    08/14/2018 Imaging    MRI PELVIS W WO Contrast 08/14/18 IMPRESSION: 1. Centrally necrotic 1.8 cm enlarged lymph node versus nonspecific soft tissue mass in the left hemipelvis adjacent to the posterior pelvic sidewall. Further evaluation with biopsy and/or PET-CT is recommended.    09/17/2018 Pathology Results    Diagnosis 09/17/18 Lymph node, needle/core biopsy, left pelvic - SQUAMOUS CELL CARCINOMA. - SEE COMMENT. Microscopic Comment    10/09/2018 PET scan    PET 10/09/18  IMPRESSION: 1. 17 mm left pelvic sidewall lesion is markedly hypermetabolic compatible with known malignancy. 2. No additional definite hypermetabolic disease identified in the neck, chest, abdomen, or  pelvis. 3. Relatively diffuse FDG accumulation in the colon without focal increased uptake above background colonic levels in the region of the anus. 4. Focal FDG accumulation identified along the anterior left femoral neck and in the lumbosacral junction. No underlying bone lesions evident at these locations on CT images. Indeterminate finding although metastatic disease cannot be excluded.     10/13/2018 Initial Biopsy    Diagnosis 10/13/18 Anus, biopsy - SQUAMOUS CELL CARCINOMA. - SEE COMMENT.    10/14/2018 Initial Diagnosis    Anal cancer ([place])    10/14/2018 Cancer Staging    Staging form: Anus, AJCC 8th Edition - Clinical stage from 10/14/2018: Stage IIIA (cT2, cN1b, cM0) - Signed by Truitt Merle, MD on 10/14/2018    11/03/2018 -  Radiation Therapy    Concurrent chemoRt with Dr. Lisbeth Renshaw for 5-6 weeks starting 11/03/18    11/03/2018 -  Chemotherapy    Concurrent chemoRt with Mitomycin and 5-FU on week 1 and week 5 starting 11/03/18      CURRENT THERAPY:  Chemoradiation for 6 weeks with Mitomycin and 5-FU on week 1 and week 5 starting 11/03/18  INTERVAL HISTORY:  Angelica Wright is here for a follow up and start of treatment. She presents to the clinic today with her husband. She notes she is doing well and ready for treatment.   Marland Kitchen   REVIEW OF SYSTEMS:   Constitutional: Denies fevers, chills or abnormal weight loss Eyes: Denies blurriness of vision Ears, nose, mouth, throat, and face: Denies mucositis or sore throat Respiratory: Denies cough, dyspnea or wheezes Cardiovascular: Denies palpitation, chest discomfort or lower extremity swelling Gastrointestinal:  Denies nausea, heartburn or change in bowel habits Skin: Denies abnormal skin rashes Lymphatics: Denies new lymphadenopathy or easy bruising  Neurological:Denies numbness, tingling or new weaknesses Behavioral/Psych: Mood is stable, no new changes  All other systems were reviewed with the patient and are  negative.  MEDICAL HISTORY:  Past Medical History:  Diagnosis Date  . Hyperparathyroidism ([place])    resolved after surgery  . Hypertension   . Osteoporosis    of the spine  . Rectocele   . Squamous cell cancer, anus ([facility])   . TMJ syndrome   . Type 2 Diabetes ([place]) 11/2013   controlled with diet/exer    SURGICAL HISTORY: Past Surgical History:  Procedure Laterality Date  . LYMPH NODE BIOPSY    . PARATHYROIDECTOMY  2000  . THERAPEUTIC ABORTION     x 2  . VULVA /PERINEUM BIOPSY  2012    I have reviewed the social history and family history with the patient and they are unchanged from previous note.  ALLERGIES:  is allergic to sulfa antibiotics.  MEDICATIONS:  Current Outpatient Medications  Medication Sig Dispense Refill  . aspirin 81 MG tablet Take 81 mg by mouth every evening.     Marland Kitchen atenolol (TENORMIN) 50 MG tablet Take 50 mg by mouth every evening.     . calcium carbonate (TUMS - DOSED IN MG ELEMENTAL CALCIUM) 500 MG chewable tablet Chew 1 tablet by mouth daily as needed for indigestion or heartburn.    . cholecalciferol (VITAMIN D3) 25 MCG (1000 UT) tablet Take 2,000 Units by mouth every evening.    . clobetasol ointment (TEMOVATE) 0.05 % Apply a pea sized amount topically BID x 1-2 weeks prn flare (Patient taking differently: Apply 1 application topically 2 (two) times daily as needed (outbreaks). Use for 1 to 2 weeks) 30 g 1  . Coenzyme Q10 (CO Q 10) 100 MG CAPS Take 100 mg by mouth daily.     . hydrochlorothiazide (HYDRODIURIL) 12.5 MG tablet Take 12.5 mg by mouth daily.     Marland Kitchen ibuprofen (ADVIL,MOTRIN) 100 MG tablet Take 400-600 mg by mouth 2 (two) times daily as needed for pain.    . Magnesium 250 MG TABS Take 250 mg by mouth every evening.    . Multiple Vitamin (MULTIVITAMIN WITH MINERALS) TABS tablet Take 1 tablet by mouth daily.    . ondansetron (ZOFRAN) 8 MG tablet Take 1 tablet (8 mg total) by mouth 2 (two) times daily as needed (Nausea or vomiting). 30 tablet 1   . prochlorperazine (COMPAZINE) 10 MG tablet Take 1 tablet (10 mg total) by mouth every 6 (six) hours as needed (Nausea or vomiting). 30 tablet 1  . SUPER B COMPLEX/C PO Take 1 tablet by mouth daily.    . valACYclovir (VALTREX) 500 MG tablet Take one tab po BID for 3 days with flares. 30 tablet 1  . vitamin C (ASCORBIC ACID) 500 MG tablet Take 500 mg by mouth every evening.     No current facility-administered medications for this visit.    Facility-Administered Medications Ordered in Other Visits  Medication Dose Route Frequency Provider Last Rate Last Dose  . lidocaine (XYLOCAINE) 1 % (with pres) injection             PHYSICAL EXAMINATION: ECOG PERFORMANCE STATUS: 0 - Asymptomatic  Vitals:   11/03/18 0954  BP: 134/79  Pulse: 71  Resp: 18  Temp: 98.4 F (36.9 C)  SpO2: 97%   Filed Weights   11/03/18 0954  Weight: 155 lb 3.2 oz (70.4 kg)    GENERAL:alert, no distress and comfortable SKIN: skin color, texture, turgor are  normal, no rashes or significant lesions EYES: normal, Conjunctiva are pink and non-injected, sclera clear OROPHARYNX:no exudate, no erythema and lips, buccal mucosa, and tongue normal  NECK: supple, thyroid normal size, non-tender, without nodularity LYMPH:  no palpable lymphadenopathy in the cervical, axillary or inguinal LUNGS: clear to auscultation and percussion with normal breathing effort HEART: regular rate & rhythm and no murmurs and no lower extremity edema ABDOMEN:abdomen soft, non-tender and normal bowel sounds Musculoskeletal:no cyanosis of digits and no clubbing  NEURO: alert & oriented x 3 with fluent speech, no focal motor/sensory deficits  LABORATORY DATA:  I have reviewed the data as listed CBC Latest Ref Rng & Units 11/03/2018 10/22/2018 09/17/2018  WBC 4.0 - 10.5 K/uL 7.3 7.4 7.5  Hemoglobin 12.0 - 15.0 g/dL 14.2 13.9 14.4  Hematocrit 36.0 - 46.0 % 42.0 41.4 42.2  Platelets 150 - 400 K/uL 290 313 299     CMP Latest Ref Rng &  Units 11/03/2018 10/22/2018  Glucose 70 - 99 mg/dL 117(H) 110(H)  BUN 8 - 23 mg/dL 14 15  Creatinine 0.44 - 1.00 mg/dL 0.80 0.81  Sodium 135 - 145 mmol/L 142 143  Potassium 3.5 - 5.1 mmol/L 4.2 4.4  Chloride 98 - 111 mmol/L 109 107  CO2 22 - 32 mmol/L 23 27  Calcium 8.9 - 10.3 mg/dL 9.8 10.0  Total Protein 6.5 - 8.1 g/dL 7.6 7.8  Total Bilirubin 0.3 - 1.2 mg/dL 0.7 0.4  Alkaline Phos 38 - 126 U/L 78 92  AST 15 - 41 U/L 24 27  ALT 0 - 44 U/L 33 42      RADIOGRAPHIC STUDIES: I have personally reviewed the radiological images as listed and agreed with the findings in the report. Ir Picc Placement Right >5 Yrs Inc Img Guide  Result Date: 11/03/2018 INDICATION: In need of temporary intravenous access for chemotherapy administration. EXAM: ULTRASOUND AND FLUOROSCOPIC GUIDED PICC LINE INSERTION MEDICATIONS: None. CONTRAST:  None FLUOROSCOPY TIME:  6 seconds (1.1 mGy) COMPLICATIONS: None immediate. TECHNIQUE: The procedure, risks, benefits, and alternatives were explained to the patient and informed written consent was obtained. A timeout was performed prior to the initiation of the procedure. The right upper extremity was prepped with chlorhexidine in a sterile fashion, and a sterile drape was applied covering the operative field. Maximum barrier sterile technique with sterile gowns and gloves were used for the procedure. A timeout was performed prior to the initiation of the procedure. Local anesthesia was provided with 1% lidocaine. Under direct ultrasound guidance, the basilic vein was accessed with a micropuncture kit after the overlying soft tissues were anesthetized with 1% lidocaine. After the overlying soft tissues were anesthetized, a small venotomy incision was created and a micropuncture kit was utilized to access the right basilic vein. Real-time ultrasound guidance was utilized for vascular access including the acquisition of a permanent ultrasound image documenting patency of the  accessed vessel. A guidewire was advanced to the level of the superior caval-atrial junction for measurement purposes and the PICC line was cut to length. A peel-away sheath was placed and a 33 cm, 5 [place], single lumen was inserted to level of the superior caval-atrial junction. A post procedure spot fluoroscopic was obtained. The catheter easily aspirated and flushed and was sutured in place. A dressing was placed. The patient tolerated the procedure well without immediate post procedural complication. FINDINGS: After catheter placement, the tip lies within the superior cavoatrial junction. The catheter aspirates and flushes normally and is ready for immediate use. IMPRESSION:  Successful ultrasound and fluoroscopic guided placement of a right basilic vein approach, 33 cm, 5 [place], single lumen PICC with tip at the superior caval-atrial junction. The PICC line is ready for immediate use. Electronically Signed   By: Sandi Mariscal M.D.   On: 11/03/2018 09:02     ASSESSMENT & PLAN:  Angelica Wright is a 62 y.o. female with   1. Anal Cancer, with positive left pelvic lymph node, cT2N1bM0, stage IIIA -She was recently diagnosed in 09/2018.  -She has locally advanced squamous cell carcinoma of the anus (in anal canal and low rectum) which has spread to pelvic lymph node.  -Her 03/2018 genetic testing was negative except for VUS of gene PMS2. . -She will proceed with standard treatment is concurrent Chemoradiation for 6 weeks with Mitomycin and 5-FU on week 1 and week 5. --Chemotherapy consent: Side effects including but does not not limited to, fatigue, nausea, vomiting, diarrhea, hair loss, neuropathy, fluid retention, renal and kidney dysfunction, neutropenic fever, needed for blood transfusion, bleeding, were discussed with patient in great detail. She agrees to proceed. -the goal of therapy is curative    -She has PICC line in place today and started radiation this morning  -Labs reviewed, CBC and CMP is  WNL except Glucose at 117. Overall adequate to proceed with Mitomycin and 5-FU today, which will finish on Friday and PICC line will be removed then  -Will f/u with her weekly with labs.  -I discussed is she develops severe or unexpected side effects she can contact clinic for symptom management.  -F/u in 1 week   2. HTN and DM -She is on atenolol and HCTZ, not on diabetic medication -Managed by her PCP -We discussed that her medications may be adjusted during chemo, will monitor closely.    PLAN:  Lab, f/u weekly X5, starting next Monday, OK to see me at 8am if no other opening      No problem-specific Assessment & Plan notes found for this encounter.   No orders of the defined types were placed in this encounter.  All questions were answered. The patient knows to call the clinic with any problems, questions or concerns. No barriers to learning was detected. I spent 15 minutes counseling the patient face to face. The total time spent in the appointment was 20 minutes and more than 50% was on counseling and review of test results     Truitt Merle, MD 11/03/2018   I, Joslyn Devon, am acting as scribe for Truitt Merle, MD.   I have reviewed the above documentation for accuracy and completeness, and I agree with the above.

## 2018-11-03 ENCOUNTER — Encounter: Payer: Self-pay | Admitting: Hematology

## 2018-11-03 ENCOUNTER — Ambulatory Visit
Admission: RE | Admit: 2018-11-03 | Discharge: 2018-11-03 | Disposition: A | Discharge status: Home / Self Care | Payer: 59 | Source: Ambulatory Visit | Attending: Radiation Oncology | Admitting: Radiation Oncology

## 2018-11-03 ENCOUNTER — Ambulatory Visit (HOSPITAL_COMMUNITY)
Admission: RE | Admit: 2018-11-03 | Discharge: 2018-11-03 | Disposition: A | Discharge status: Home / Self Care | Payer: 59 | Source: Ambulatory Visit | Attending: Hematology | Admitting: Hematology

## 2018-11-03 ENCOUNTER — Inpatient Hospital Stay: Payer: 59

## 2018-11-03 ENCOUNTER — Inpatient Hospital Stay (HOSPITAL_BASED_OUTPATIENT_CLINIC_OR_DEPARTMENT_OTHER): Payer: 59 | Admitting: Hematology

## 2018-11-03 ENCOUNTER — Telehealth: Payer: Self-pay | Admitting: Hematology

## 2018-11-03 ENCOUNTER — Other Ambulatory Visit: Payer: Self-pay | Admitting: Hematology

## 2018-11-03 VITALS — BP 134/79 | HR 71 | Temp 98.4°F | Resp 18 | Ht 61.0 in | Wt 155.2 lb

## 2018-11-03 VITALS — BP 145/73 | HR 66 | Temp 98.2°F | Resp 18

## 2018-11-03 DIAGNOSIS — E119 Type 2 diabetes mellitus without complications: Secondary | ICD-10-CM | POA: Diagnosis not present

## 2018-11-03 DIAGNOSIS — C775 Secondary and unspecified malignant neoplasm of intrapelvic lymph nodes: Secondary | ICD-10-CM

## 2018-11-03 DIAGNOSIS — C210 Malignant neoplasm of anus, unspecified: Secondary | ICD-10-CM

## 2018-11-03 DIAGNOSIS — I10 Essential (primary) hypertension: Secondary | ICD-10-CM | POA: Diagnosis not present

## 2018-11-03 DIAGNOSIS — C211 Malignant neoplasm of anal canal: Secondary | ICD-10-CM

## 2018-11-03 DIAGNOSIS — Z803 Family history of malignant neoplasm of breast: Secondary | ICD-10-CM

## 2018-11-03 DIAGNOSIS — Z5111 Encounter for antineoplastic chemotherapy: Secondary | ICD-10-CM | POA: Diagnosis not present

## 2018-11-03 LAB — CBC WITH DIFFERENTIAL (CANCER CENTER ONLY)
Abs Immature Granulocytes: 0.03 10*3/uL (ref 0.00–0.07)
BASOS ABS: 0 10*3/uL (ref 0.0–0.1)
Basophils Relative: 0 %
Eosinophils Absolute: 0 10*3/uL (ref 0.0–0.5)
Eosinophils Relative: 0 %
HCT: 42 % (ref 36.0–46.0)
Hemoglobin: 14.2 g/dL (ref 12.0–15.0)
IMMATURE GRANULOCYTES: 0 %
Lymphocytes Relative: 16 %
Lymphs Abs: 1.2 10*3/uL (ref 0.7–4.0)
MCH: 31.9 pg (ref 26.0–34.0)
MCHC: 33.8 g/dL (ref 30.0–36.0)
MCV: 94.4 fL (ref 80.0–100.0)
Monocytes Absolute: 0.4 10*3/uL (ref 0.1–1.0)
Monocytes Relative: 5 %
NRBC: 0 % (ref 0.0–0.2)
Neutro Abs: 5.7 10*3/uL (ref 1.7–7.7)
Neutrophils Relative %: 79 %
Platelet Count: 290 10*3/uL (ref 150–400)
RBC: 4.45 MIL/uL (ref 3.87–5.11)
RDW: 12.6 % (ref 11.5–15.5)
WBC Count: 7.3 10*3/uL (ref 4.0–10.5)

## 2018-11-03 LAB — CMP (CANCER CENTER ONLY)
ALT: 33 U/L (ref 0–44)
AST: 24 U/L (ref 15–41)
Albumin: 4.2 g/dL (ref 3.5–5.0)
Alkaline Phosphatase: 78 U/L (ref 38–126)
Anion gap: 10 (ref 5–15)
BUN: 14 mg/dL (ref 8–23)
CO2: 23 mmol/L (ref 22–32)
Calcium: 9.8 mg/dL (ref 8.9–10.3)
Chloride: 109 mmol/L (ref 98–111)
Creatinine: 0.8 mg/dL (ref 0.44–1.00)
GFR, Est AFR Am: >60 mL/min (ref >60)
GFR, Estimated: >60 mL/min (ref >60)
Glucose, Bld: 117 mg/dL — ABNORMAL HIGH (ref 70–99)
Potassium: 4.2 mmol/L (ref 3.5–5.1)
Sodium: 142 mmol/L (ref 135–145)
Total Bilirubin: 0.7 mg/dL (ref 0.3–1.2)
Total Protein: 7.6 g/dL (ref 6.5–8.1)

## 2018-11-03 MED ORDER — PROCHLORPERAZINE MALEATE 10 MG PO TABS
ORAL_TABLET | ORAL | Status: AC
  Filled 2018-11-03: qty 1

## 2018-11-03 MED ORDER — SODIUM CHLORIDE 0.9 % IV SOLN
1000.0000 mg/m2/d | INTRAVENOUS | Status: DC
  Administered 2018-11-03: 7000 mg via INTRAVENOUS
  Filled 2018-11-03: qty 140

## 2018-11-03 MED ORDER — PROCHLORPERAZINE MALEATE 10 MG PO TABS
10.0000 mg | ORAL_TABLET | Freq: Once | ORAL | Status: AC
  Administered 2018-11-03: 10 mg via ORAL

## 2018-11-03 MED ORDER — SODIUM CHLORIDE 0.9% FLUSH
10.0000 mL | INTRAVENOUS | Status: DC | PRN
  Filled 2018-11-03: qty 10

## 2018-11-03 MED ORDER — HEPARIN SOD (PORK) LOCK FLUSH 100 UNIT/ML IV SOLN
250.0000 [IU] | Freq: Once | INTRAVENOUS | Status: DC | PRN
  Filled 2018-11-03: qty 5

## 2018-11-03 MED ORDER — MITOMYCIN CHEMO IV INJECTION 20 MG
10.2000 mg/m2 | Freq: Once | INTRAVENOUS | Status: AC
  Administered 2018-11-03: 18 mg via INTRAVENOUS
  Filled 2018-11-03: qty 36

## 2018-11-03 MED ORDER — SODIUM CHLORIDE 0.9 % IV SOLN
Freq: Once | INTRAVENOUS | Status: AC
  Administered 2018-11-03: 15:00:00 via INTRAVENOUS
  Filled 2018-11-03: qty 250

## 2018-11-03 MED ORDER — LIDOCAINE HCL 1 % IJ SOLN
INTRAMUSCULAR | Status: AC
  Filled 2018-11-03: qty 20

## 2018-11-03 NOTE — Progress Notes (Signed)
  Oncology Nurse Navigator Documentation  Navigator Location: [facility] (11/03/18 1101)  Met with Angelica Wright and her husband to offer support and to  discuss possibility of referral to [facility] for pelvic floor rehab. Patient agreed to referral. Referral placed.

## 2018-11-03 NOTE — Telephone Encounter (Signed)
Gave patient avs report and appointments for January and February  °

## 2018-11-03 NOTE — Procedures (Signed)
Pre procedural Diagnosis: Poor venous access Post Procedural Diagnosis: Same  Successful placement of right basilic vein approach 33 cm single lumen PICC line with tip at the superior caval-atrial junction.    EBL: None  No immediate post procedural complication.  The PICC line is ready for immediate use.  Ronny Bacon, MD Pager #: [phone]

## 2018-11-03 NOTE — Patient Instructions (Signed)
[facility] Discharge Instructions for Patients Receiving Chemotherapy  Today you received the following chemotherapy agents Mitomycin and 5-Fluorouracil  To help prevent nausea and vomiting after your treatment, we encourage you to take your nausea medication as directed.   If you develop nausea and vomiting that is not controlled by your nausea medication, call the clinic.   BELOW ARE SYMPTOMS THAT SHOULD BE REPORTED IMMEDIATELY:  *FEVER GREATER THAN 100.5 F  *CHILLS WITH OR WITHOUT FEVER  NAUSEA AND VOMITING THAT IS NOT CONTROLLED WITH YOUR NAUSEA MEDICATION  *UNUSUAL SHORTNESS OF BREATH  *UNUSUAL BRUISING OR BLEEDING  TENDERNESS IN MOUTH AND THROAT WITH OR WITHOUT PRESENCE OF ULCERS  *URINARY PROBLEMS  *BOWEL PROBLEMS  UNUSUAL RASH Items with * indicate a potential emergency and should be followed up as soon as possible.  Feel free to call the clinic should you have any questions or concerns. The clinic phone number is [phone].  Please show the [facility] at check-in to the Emergency Department and triage nurse.  Mitomycin injection What is this medicine? MITOMYCIN (mye toe MYE sin) is a chemotherapy drug. This medicine is used to treat cancer of the stomach and pancreas. This medicine may be used for other purposes; ask your health care provider or pharmacist if you have questions. COMMON BRAND NAME(S): Mutamycin What should I tell my health care provider before I take this medicine? They need to know if you have any of these conditions: -anemia -bleeding disorder -infection (especially a virus infection such as chickenpox, cold sores, or herpes) -kidney disease -low blood counts like low platelets, red blood cells, white blood cells -recent radiation therapy -an unusual or allergic reaction to mitomycin, other chemotherapy agents, other medicines, foods, dyes, or preservatives -pregnant or trying to get pregnant -breast-feeding How  should I use this medicine? This drug is given as an injection or infusion into a vein. It is administered in a hospital or clinic by a specially trained health care professional. Talk to your pediatrician regarding the use of this medicine in children. Special care may be needed. Overdosage: If you think you have taken too much of this medicine contact a poison control center or emergency room at once. NOTE: This medicine is only for you. Do not share this medicine with others. What if I miss a dose? It is important not to miss your dose. Call your doctor or health care professional if you are unable to keep an appointment. What may interact with this medicine? -medicines to increase blood counts like filgrastim, pegfilgrastim, sargramostim -vaccines This list may not describe all possible interactions. Give your health care provider a list of all the medicines, herbs, non-prescription drugs, or dietary supplements you use. Also tell them if you smoke, drink alcohol, or use illegal drugs. Some items may interact with your medicine. What should I watch for while using this medicine? Your condition will be monitored carefully while you are receiving this medicine. You will need important blood work done while you are taking this medicine. This drug may make you feel generally unwell. This is not uncommon, as chemotherapy can affect healthy cells as well as cancer cells. Report any side effects. Continue your course of treatment even though you feel ill unless your doctor tells you to stop. Call your doctor or health care professional for advice if you get a fever, chills or sore throat, or other symptoms of a cold or flu. Do not treat yourself. This drug decreases your body's ability  to fight infections. Try to avoid being around people who are sick. This medicine may increase your risk to bruise or bleed. Call your doctor or health care professional if you notice any unusual bleeding. Be careful  brushing and flossing your teeth or using a toothpick because you may get an infection or bleed more easily. If you have any dental work done, tell your dentist you are receiving this medicine. Avoid taking products that contain aspirin, acetaminophen, ibuprofen, naproxen, or ketoprofen unless instructed by your doctor. These medicines may hide a fever. Do not become pregnant while taking this medicine. Women should inform their doctor if they wish to become pregnant or think they might be pregnant. There is a potential for serious side effects to an unborn child. Talk to your health care professional or pharmacist for more information. Do not breast-feed an infant while taking this medicine. What side effects may I notice from receiving this medicine? Side effects that you should report to your doctor or health care professional as soon as possible: -allergic reactions like skin rash, itching or hives, swelling of the face, lips, or tongue -low blood counts - this medicine may decrease the number of white blood cells, red blood cells and platelets. You may be at increased risk for infections and bleeding. -signs of infection - fever or chills, cough, sore throat, pain or difficulty passing urine -signs of decreased platelets or bleeding - bruising, pinpoint red spots on the skin, black, tarry stools, blood in the urine -signs of decreased red blood cells - unusually weak or tired, fainting spells, lightheadedness -breathing problems -changes in vision -chest pain -confusion -dry cough -high blood pressure -mouth sores -pain, swelling, redness at site where injected -pain, tingling, numbness in the hands or feet -seizures -swelling of the ankles, feet, hands -trouble passing urine or change in the amount of urine Side effects that usually do not require medical attention (report to your doctor or health care professional if they continue or are bothersome): -diarrhea -green to blue color of  urine -hair loss -loss of appetite -nausea, vomiting This list may not describe all possible side effects. Call your doctor for medical advice about side effects. You may report side effects to FDA at 1-800-FDA-1088. Where should I keep my medicine? This drug is given in a hospital or clinic and will not be stored at home. NOTE: This sheet is a summary. It may not cover all possible information. If you have questions about this medicine, talk to your doctor, pharmacist, or health care provider.  2019 Elsevier/Gold Standard (2008-04-29 11:16:23)  Fluorouracil, 5-FU injection What is this medicine? FLUOROURACIL, 5-FU (flure oh YOOR a sil) is a chemotherapy drug. It slows the growth of cancer cells. This medicine is used to treat many types of cancer like breast cancer, colon or rectal cancer, pancreatic cancer, and stomach cancer. This medicine may be used for other purposes; ask your health care provider or pharmacist if you have questions. COMMON BRAND NAME(S): Adrucil What should I tell my health care provider before I take this medicine? They need to know if you have any of these conditions: -blood disorders -dihydropyrimidine dehydrogenase (DPD) deficiency -infection (especially a virus infection such as chickenpox, cold sores, or herpes) -kidney disease -liver disease -malnourished, poor nutrition -recent or ongoing radiation therapy -an unusual or allergic reaction to fluorouracil, other chemotherapy, other medicines, foods, dyes, or preservatives -pregnant or trying to get pregnant -breast-feeding How should I use this medicine? This drug is given as an  infusion or injection into a vein. It is administered in a hospital or clinic by a specially trained health care professional. Talk to your pediatrician regarding the use of this medicine in children. Special care may be needed. Overdosage: If you think you have taken too much of this medicine contact a poison control center or  emergency room at once. NOTE: This medicine is only for you. Do not share this medicine with others. What if I miss a dose? It is important not to miss your dose. Call your doctor or health care professional if you are unable to keep an appointment. What may interact with this medicine? -allopurinol -cimetidine -dapsone -digoxin -hydroxyurea -leucovorin -levamisole -medicines for seizures like ethotoin, fosphenytoin, phenytoin -medicines to increase blood counts like filgrastim, pegfilgrastim, sargramostim -medicines that treat or prevent blood clots like warfarin, enoxaparin, and dalteparin -methotrexate -metronidazole -pyrimethamine -some other chemotherapy drugs like busulfan, cisplatin, estramustine, vinblastine -trimethoprim -trimetrexate -vaccines Talk to your doctor or health care professional before taking any of these medicines: -acetaminophen -aspirin -ibuprofen -ketoprofen -naproxen This list may not describe all possible interactions. Give your health care provider a list of all the medicines, herbs, non-prescription drugs, or dietary supplements you use. Also tell them if you smoke, drink alcohol, or use illegal drugs. Some items may interact with your medicine. What should I watch for while using this medicine? Visit your doctor for checks on your progress. This drug may make you feel generally unwell. This is not uncommon, as chemotherapy can affect healthy cells as well as cancer cells. Report any side effects. Continue your course of treatment even though you feel ill unless your doctor tells you to stop. In some cases, you may be given additional medicines to help with side effects. Follow all directions for their use. Call your doctor or health care professional for advice if you get a fever, chills or sore throat, or other symptoms of a cold or flu. Do not treat yourself. This drug decreases your body's ability to fight infections. Try to avoid being around people  who are sick. This medicine may increase your risk to bruise or bleed. Call your doctor or health care professional if you notice any unusual bleeding. Be careful brushing and flossing your teeth or using a toothpick because you may get an infection or bleed more easily. If you have any dental work done, tell your dentist you are receiving this medicine. Avoid taking products that contain aspirin, acetaminophen, ibuprofen, naproxen, or ketoprofen unless instructed by your doctor. These medicines may hide a fever. Do not become pregnant while taking this medicine. Women should inform their doctor if they wish to become pregnant or think they might be pregnant. There is a potential for serious side effects to an unborn child. Talk to your health care professional or pharmacist for more information. Do not breast-feed an infant while taking this medicine. Men should inform their doctor if they wish to father a child. This medicine may lower sperm counts. Do not treat diarrhea with over the counter products. Contact your doctor if you have diarrhea that lasts more than 2 days or if it is severe and watery. This medicine can make you more sensitive to the sun. Keep out of the sun. If you cannot avoid being in the sun, wear protective clothing and use sunscreen. Do not use sun lamps or tanning beds/booths. What side effects may I notice from receiving this medicine? Side effects that you should report to your doctor or health care professional  as soon as possible: -allergic reactions like skin rash, itching or hives, swelling of the face, lips, or tongue -low blood counts - this medicine may decrease the number of white blood cells, red blood cells and platelets. You may be at increased risk for infections and bleeding. -signs of infection - fever or chills, cough, sore throat, pain or difficulty passing urine -signs of decreased platelets or bleeding - bruising, pinpoint red spots on the skin, black, tarry  stools, blood in the urine -signs of decreased red blood cells - unusually weak or tired, fainting spells, lightheadedness -breathing problems -changes in vision -chest pain -mouth sores -nausea and vomiting -pain, swelling, redness at site where injected -pain, tingling, numbness in the hands or feet -redness, swelling, or sores on hands or feet -stomach pain -unusual bleeding Side effects that usually do not require medical attention (report to your doctor or health care professional if they continue or are bothersome): -changes in finger or toe nails -diarrhea -dry or itchy skin -hair loss -headache -loss of appetite -sensitivity of eyes to the light -stomach upset -unusually teary eyes This list may not describe all possible side effects. Call your doctor for medical advice about side effects. You may report side effects to FDA at 1-800-FDA-1088. Where should I keep my medicine? This drug is given in a hospital or clinic and will not be stored at home. NOTE: This sheet is a summary. It may not cover all possible information. If you have questions about this medicine, talk to your doctor, pharmacist, or health care provider.  2019 Elsevier/Gold Standard (2008-02-25 13:53:16)

## 2018-11-03 NOTE — Progress Notes (Signed)
Went to infusion to meet with patient to introduce myself as Arboriculturist and to offer available resources.  There is no copay assistance available for her diagnosis.  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  Gave my card for any additional financial questions or concerns.

## 2018-11-04 ENCOUNTER — Encounter: Payer: Self-pay | Admitting: Hematology

## 2018-11-04 ENCOUNTER — Telehealth: Payer: Self-pay

## 2018-11-04 ENCOUNTER — Ambulatory Visit
Admission: RE | Admit: 2018-11-04 | Discharge: 2018-11-04 | Disposition: A | Discharge status: Home / Self Care | Payer: 59 | Source: Ambulatory Visit | Attending: Radiation Oncology | Admitting: Radiation Oncology

## 2018-11-04 DIAGNOSIS — C210 Malignant neoplasm of anus, unspecified: Secondary | ICD-10-CM | POA: Diagnosis not present

## 2018-11-04 NOTE — Telephone Encounter (Signed)
Spoke with patient to follow up on first time chemo yesterday.  Patient is having only mild nausea, has medication on hand to take if needed, denies any other adverse side effects.

## 2018-11-04 NOTE — Telephone Encounter (Signed)
-----   Message from Ishmael Holter, RN sent at 11/03/2018  2:52 PM EST ----- Regarding: Dr. Burr Medico 1st tx f/u call Dr. Burr Medico 1st time f/u call

## 2018-11-06 ENCOUNTER — Ambulatory Visit
Admission: RE | Admit: 2018-11-06 | Discharge: 2018-11-06 | Disposition: A | Discharge status: Home / Self Care | Payer: 59 | Source: Ambulatory Visit | Attending: Radiation Oncology | Admitting: Radiation Oncology

## 2018-11-06 ENCOUNTER — Ambulatory Visit: Payer: 59 | Admitting: Gastroenterology

## 2018-11-06 DIAGNOSIS — Z510 Encounter for antineoplastic radiation therapy: Secondary | ICD-10-CM

## 2018-11-06 DIAGNOSIS — M6281 Muscle weakness (generalized): Secondary | ICD-10-CM | POA: Diagnosis present

## 2018-11-06 DIAGNOSIS — E119 Type 2 diabetes mellitus without complications: Secondary | ICD-10-CM | POA: Diagnosis not present

## 2018-11-06 DIAGNOSIS — C210 Malignant neoplasm of anus, unspecified: Secondary | ICD-10-CM

## 2018-11-06 DIAGNOSIS — K649 Unspecified hemorrhoids: Secondary | ICD-10-CM | POA: Diagnosis not present

## 2018-11-06 DIAGNOSIS — R197 Diarrhea, unspecified: Secondary | ICD-10-CM | POA: Diagnosis not present

## 2018-11-06 DIAGNOSIS — K1231 Oral mucositis (ulcerative) due to antineoplastic therapy: Secondary | ICD-10-CM | POA: Diagnosis not present

## 2018-11-06 DIAGNOSIS — R252 Cramp and spasm: Secondary | ICD-10-CM | POA: Diagnosis not present

## 2018-11-06 DIAGNOSIS — R279 Unspecified lack of coordination: Secondary | ICD-10-CM | POA: Diagnosis not present

## 2018-11-06 DIAGNOSIS — I10 Essential (primary) hypertension: Secondary | ICD-10-CM | POA: Diagnosis not present

## 2018-11-07 ENCOUNTER — Ambulatory Visit
Admission: RE | Admit: 2018-11-07 | Discharge: 2018-11-07 | Disposition: A | Discharge status: Home / Self Care | Payer: 59 | Source: Ambulatory Visit | Attending: Radiation Oncology | Admitting: Radiation Oncology

## 2018-11-07 ENCOUNTER — Inpatient Hospital Stay: Payer: 59

## 2018-11-07 VITALS — BP 139/72 | HR 92 | Temp 98.4°F | Resp 18

## 2018-11-07 DIAGNOSIS — I10 Essential (primary) hypertension: Secondary | ICD-10-CM | POA: Diagnosis not present

## 2018-11-07 DIAGNOSIS — R252 Cramp and spasm: Secondary | ICD-10-CM | POA: Diagnosis not present

## 2018-11-07 DIAGNOSIS — C210 Malignant neoplasm of anus, unspecified: Secondary | ICD-10-CM | POA: Diagnosis not present

## 2018-11-07 DIAGNOSIS — E119 Type 2 diabetes mellitus without complications: Secondary | ICD-10-CM | POA: Diagnosis not present

## 2018-11-07 DIAGNOSIS — K649 Unspecified hemorrhoids: Secondary | ICD-10-CM | POA: Diagnosis not present

## 2018-11-07 MED ORDER — HEPARIN SOD (PORK) LOCK FLUSH 100 UNIT/ML IV SOLN
500.0000 [IU] | Freq: Once | INTRAVENOUS | Status: AC | PRN
  Administered 2018-11-07: 250 [IU]
  Filled 2018-11-07: qty 5

## 2018-11-07 MED ORDER — SODIUM CHLORIDE 0.9% FLUSH
10.0000 mL | INTRAVENOUS | Status: DC | PRN
  Administered 2018-11-07: 10 mL
  Filled 2018-11-07: qty 10

## 2018-11-07 NOTE — Progress Notes (Signed)
Tolerated PICC removal well.  Stayed flat for 30 min.  VSS.  Ambulatory w/husband, steady gait.  Verbalized understanding of d/c instructions.

## 2018-11-07 NOTE — Progress Notes (Signed)
Per order, PICC line removed.  Site clean, dry, intact.  Line pulled 33cm.  Pt tolerated well.  Pt observed 55minutes post procedure.  Pressure dressing/vaseline bandage applied.  Care instructions given.

## 2018-11-07 NOTE — Patient Instructions (Signed)
PICC Removal, Adult, Care After This sheet gives you information about how to care for yourself after your procedure. Your health care provider may also give you more specific instructions. If you have problems or questions, contact your health care provider. What can I expect after the procedure? After your procedure, it is common to have:  Tenderness or soreness.  Redness, swelling, or a scab where the PICC was removed (exit site). Follow these instructions at home: For the first 24 hours after the procedure   Keep the bandage (dressing) on the exit site clean and dry. Do not remove the dressing until your health care provider tells you to do so.  Check your arm often for signs and symptoms of an infection. Check for: ? A red streak that spreads away from the dressing. ? Blood or fluid that you can see on the dressing. ? More redness or swelling.  Do not lift anything heavy or do activities that require great effort until your health care provider says it is okay. You should avoid: ? Lifting weights. ? Yard work. ? Any physical activity with repetitive arm movement.  Watch closely for any signs of an air bubble in the vein (air embolism). This is a rare but serious complication. If you have signs of air embolism, call 911 immediately and lie down on your left side to keep the air from moving into the lungs. Signs of an air embolism include: ? Difficulty breathing. ? Chest pain. ? Coughing or wheezing. ? Skin that is pale, blue, cold, or clammy. ? Rapid pulse. ? Rapid breathing. ? Fainting. After 24 Hours have passed:  Remove your dressing as told by your health care provider. Make sure you wash your hands with soap and water before and after you change the dressing. If soap and water are not available, use hand sanitizer.  Return to your normal activities as told by your health care provider.  A small scab may develop over the exit site. Do not pick at the scab.  When  bathing or showering, gently wash the exit site with soap and water. Pat it dry.  Watch for signs of infection, such as: ? Fever or chills. ? Swollen glands under the arm. ? More redness, swelling, or soreness in the arm. ? Blood, fluid, or pus coming from the exit site. ? Warmth or a bad smell at the exit site. ? A red streak spreading away from the exit site. General instructions  Take over-the-counter and prescription medicines only as told by your health care provider. Do not take any new medicines without checking with your health care provider first.  If you were prescribed an antibiotic medicine, apply or take it as told by your health care provider. Do not stop using the antibiotic even if your condition improves.  Keep all follow-up visits as told by your health care provider. This is important. Contact a health care provider if:  You have a fever or chills.  You have soreness, redness, or swelling on your exit site, and it gets worse.  You have swollen glands under your arm.  You have any of the following symptoms at your exit site: ? Blood, fluid, or pus. ? Unusual warmth. ? A bad smell. ? A red streak spreading away from the exit site. Get help right away if:  You have numbness or tingling in your fingers, hand, or arm.  Your arm looks blue and feels cold.  You have signs of an air embolism, such   A bad smell.  ? A red streak spreading away from the exit site.  Get help right away if:  · You have numbness or tingling in your fingers, hand, or arm.  · Your arm looks blue and feels cold.  · You have signs of an air embolism, such as:  ? Difficulty breathing.  ? Chest pain.  ? Coughing or wheezing.  ? Skin that is pale, blue, cold, or clammy.  ? Rapid pulse.  ? Rapid breathing.  ? Fainting.  These symptoms may represent a serious problem that is an emergency. Do not wait to see if the symptoms will go away. Get medical help right away. Call your local emergency services (911 in the [place] Do not drive yourself to the hospital.  Summary  · After your procedure, it is common to have tenderness or soreness, redness, swelling, or a scab at the exit site.  · Keep the dressing over the exit site clean and dry.  Do not remove the dressing until your health care provider tells you to do so.  · Do not lift anything heavy or do activities that require great effort until your health care provider says it is okay.  · Watch closely for any signs of an air embolism. If you have signs of air embolism, call 911 immediately and lie down on your left side.  This information is not intended to replace advice given to you by your health care provider. Make sure you discuss any questions you have with your health care provider.  Document Released: 10/27/2013 Document Revised: 12/18/2016 Document Reviewed: 12/18/2016  Elsevier Interactive Patient Education © 2019 [employer]

## 2018-11-10 ENCOUNTER — Ambulatory Visit
Admission: RE | Admit: 2018-11-10 | Discharge: 2018-11-10 | Disposition: A | Discharge status: Home / Self Care | Payer: 59 | Source: Ambulatory Visit | Attending: Radiation Oncology | Admitting: Radiation Oncology

## 2018-11-10 ENCOUNTER — Inpatient Hospital Stay: Payer: 59

## 2018-11-10 ENCOUNTER — Inpatient Hospital Stay: Payer: 59 | Admitting: Hematology

## 2018-11-10 ENCOUNTER — Ambulatory Visit: Admit: 2018-11-10 | Payer: 59 | Admitting: Orthopedic Surgery

## 2018-11-10 ENCOUNTER — Telehealth: Payer: Self-pay | Admitting: Hematology

## 2018-11-10 ENCOUNTER — Encounter: Payer: Self-pay | Admitting: Hematology

## 2018-11-10 VITALS — BP 132/86 | HR 71 | Temp 98.3°F | Resp 17 | Ht 61.0 in | Wt 154.1 lb

## 2018-11-10 DIAGNOSIS — K1379 Other lesions of oral mucosa: Secondary | ICD-10-CM

## 2018-11-10 DIAGNOSIS — R252 Cramp and spasm: Secondary | ICD-10-CM | POA: Diagnosis not present

## 2018-11-10 DIAGNOSIS — E119 Type 2 diabetes mellitus without complications: Secondary | ICD-10-CM

## 2018-11-10 DIAGNOSIS — C210 Malignant neoplasm of anus, unspecified: Secondary | ICD-10-CM

## 2018-11-10 DIAGNOSIS — K1231 Oral mucositis (ulcerative) due to antineoplastic therapy: Secondary | ICD-10-CM

## 2018-11-10 DIAGNOSIS — M8580 Other specified disorders of bone density and structure, unspecified site: Secondary | ICD-10-CM

## 2018-11-10 DIAGNOSIS — R197 Diarrhea, unspecified: Secondary | ICD-10-CM

## 2018-11-10 DIAGNOSIS — K649 Unspecified hemorrhoids: Secondary | ICD-10-CM

## 2018-11-10 DIAGNOSIS — I10 Essential (primary) hypertension: Secondary | ICD-10-CM

## 2018-11-10 LAB — CMP (CANCER CENTER ONLY)
ALT: 22 U/L (ref 0–44)
AST: 19 U/L (ref 15–41)
Albumin: 3.7 g/dL (ref 3.5–5.0)
Alkaline Phosphatase: 74 U/L (ref 38–126)
Anion gap: 10 (ref 5–15)
BUN: 14 mg/dL (ref 8–23)
CO2: 24 mmol/L (ref 22–32)
Calcium: 9 mg/dL (ref 8.9–10.3)
Chloride: 107 mmol/L (ref 98–111)
Creatinine: 0.79 mg/dL (ref 0.44–1.00)
GFR, Est AFR Am: >60 mL/min (ref >60)
GFR, Estimated: >60 mL/min (ref >60)
GLUCOSE: 113 mg/dL — AB (ref 70–99)
Potassium: 3.7 mmol/L (ref 3.5–5.1)
SODIUM: 141 mmol/L (ref 135–145)
Total Bilirubin: 0.6 mg/dL (ref 0.3–1.2)
Total Protein: 6.9 g/dL (ref 6.5–8.1)

## 2018-11-10 LAB — CBC WITH DIFFERENTIAL (CANCER CENTER ONLY)
Abs Immature Granulocytes: 0.04 10*3/uL (ref 0.00–0.07)
Basophils Absolute: 0 10*3/uL (ref 0.0–0.1)
Basophils Relative: 0 %
Eosinophils Absolute: 0.1 10*3/uL (ref 0.0–0.5)
Eosinophils Relative: 2 %
HEMATOCRIT: 37 % (ref 36.0–46.0)
Hemoglobin: 12.6 g/dL (ref 12.0–15.0)
Immature Granulocytes: 1 %
LYMPHS ABS: 0.7 10*3/uL (ref 0.7–4.0)
LYMPHS PCT: 17 %
MCH: 31.9 pg (ref 26.0–34.0)
MCHC: 34.1 g/dL (ref 30.0–36.0)
MCV: 93.7 fL (ref 80.0–100.0)
Monocytes Absolute: 0.1 10*3/uL (ref 0.1–1.0)
Monocytes Relative: 2 %
Neutro Abs: 3 10*3/uL (ref 1.7–7.7)
Neutrophils Relative %: 78 %
Platelet Count: 204 10*3/uL (ref 150–400)
RBC: 3.95 MIL/uL (ref 3.87–5.11)
RDW: 12 % (ref 11.5–15.5)
WBC Count: 3.9 10*3/uL — ABNORMAL LOW (ref 4.0–10.5)
nRBC: 0 % (ref 0.0–0.2)

## 2018-11-10 SURGERY — ARTHROPLASTY, KNEE, TOTAL
Anesthesia: Choice | Site: Knee | Laterality: Right

## 2018-11-10 MED ORDER — MAGIC MOUTHWASH W/LIDOCAINE: 5.0000 mL | Freq: Three times a day (TID) | ORAL | 1 refills | Status: DC

## 2018-11-10 NOTE — Telephone Encounter (Signed)
No los per 01/06.

## 2018-11-10 NOTE — Progress Notes (Signed)
[name]   [phone] Fax:([phone]   Clinic Follow up Note   Patient Care Team: Shirline Frees, MD as PCP - General (Family Medicine)  Date of Service:  11/10/2018  CHIEF COMPLAINT: F/u of anal cancer  SUMMARY OF ONCOLOGIC HISTORY:   Anal cancer ([place])   03/19/2018 Genetic Testing    Genetic testing performed through [facility]'s Common Hereditary Cancers Panel reported out on 03/10/2018 showed no pathogenic mutations. The Common Hereditary Cancer Panel offered by [facility] includes sequencing and/or deletion duplication testing of the following 47 genes: APC, ATM, AXIN2, BARD1, BMPR1A, BRCA1, BRCA2, BRIP1, CDH1, CDKN2A (p14ARF), CDKN2A (p16INK4a), CKD4, CHEK2, CTNNA1, DICER1, EPCAM (Deletion/duplication testing only), GREM1 (promoter region deletion/duplication testing only), KIT, MEN1, MLH1, MSH2, MSH3, MSH6, MUTYH, NBN, NF1, NHTL1, PALB2, PDGFRA, PMS2, POLD1, POLE, PTEN, RAD50, RAD51C, RAD51D, SDHB, SDHC, SDHD, SMAD4, SMARCA4. STK11, TP53, TSC1, TSC2, and VHL.  The following genes were evaluated for sequence changes only: SDHA and HOXB13 c.251G>A variant only.  A variant of uncertain significance (VUS) in a gene called PMS2 was also noted. c.1399G>A (p.Val467Ile)    08/14/2018 Imaging    MRI PELVIS W WO Contrast 08/14/18 IMPRESSION: 1. Centrally necrotic 1.8 cm enlarged lymph node versus nonspecific soft tissue mass in the left hemipelvis adjacent to the posterior pelvic sidewall. Further evaluation with biopsy and/or PET-CT is recommended.    09/17/2018 Pathology Results    Diagnosis 09/17/18 Lymph node, needle/core biopsy, left pelvic - SQUAMOUS CELL CARCINOMA. - SEE COMMENT. Microscopic Comment    10/09/2018 PET scan    PET 10/09/18  IMPRESSION: 1. 17 mm left pelvic sidewall lesion is markedly hypermetabolic compatible with known malignancy. 2. No additional definite hypermetabolic disease identified in the neck, chest, abdomen, or pelvis. 3.  Relatively diffuse FDG accumulation in the colon without focal increased uptake above background colonic levels in the region of the anus. 4. Focal FDG accumulation identified along the anterior left femoral neck and in the lumbosacral junction. No underlying bone lesions evident at these locations on CT images. Indeterminate finding although metastatic disease cannot be excluded.     10/13/2018 Initial Biopsy    Diagnosis 10/13/18 Anus, biopsy - SQUAMOUS CELL CARCINOMA. - SEE COMMENT.    10/14/2018 Initial Diagnosis    Anal cancer ([place])    10/14/2018 Cancer Staging    Staging form: Anus, AJCC 8th Edition - Clinical stage from 10/14/2018: Stage IIIA (cT2, cN1b, cM0) - Signed by Truitt Merle, MD on 10/14/2018    11/03/2018 -  Radiation Therapy    Concurrent chemoRt with Dr. Lisbeth Renshaw for 5-6 weeks starting 11/03/18    11/03/2018 -  Chemotherapy    Concurrent chemoRt with Mitomycin and 5-FU on week 1 and week 5 starting 11/03/18      CURRENT THERAPY:  Chemoradiation for 6 weeks with Mitomycin and 5-FU on week 1 and week 5 starting 11/03/18  INTERVAL HISTORY:  Angelica Wright is here for a follow up of anal cancer and treatment. She presents to the clinic today with her husband. She notes she tolerating her first cycle treatment well overall. She notes dysphagia and rawness and pain of mouth. She does not have magic mouthwash. She also mentioned Hyperpigmented ring on left abdomen and small red rash of upper left chest. She notes having history of shingles on her back before.    REVIEW OF SYSTEMS:   Constitutional: Denies fevers, chills or abnormal weight loss Eyes: Denies blurriness of vision Ears, nose, mouth, throat, and face: Denies mucositis  or sore throat Respiratory: Denies cough, dyspnea or wheezes Cardiovascular: Denies palpitation, chest discomfort or lower extremity swelling Gastrointestinal:  Denies nausea, heartburn or change in bowel habits Skin: (+) Hyperpigmented ring  on left abdomen (+) small red rash of upper left chest  Lymphatics: Denies new lymphadenopathy or easy bruising Neurological:Denies numbness, tingling or new weaknesses Behavioral/Psych: Mood is stable, no new changes  All other systems were reviewed with the patient and are negative.  MEDICAL HISTORY:  Past Medical History:  Diagnosis Date  . Hyperparathyroidism ([place])    resolved after surgery  . Hypertension   . Osteoporosis    of the spine  . Rectocele   . Squamous cell cancer, anus ([facility])   . TMJ syndrome   . Type 2 Diabetes ([place]) 11/2013   controlled with diet/exer    SURGICAL HISTORY: Past Surgical History:  Procedure Laterality Date  . LYMPH NODE BIOPSY    . PARATHYROIDECTOMY  2000  . THERAPEUTIC ABORTION     x 2  . VULVA /PERINEUM BIOPSY  2012    I have reviewed the social history and family history with the patient and they are unchanged from previous note.  ALLERGIES:  is allergic to sulfa antibiotics.  MEDICATIONS:  Current Outpatient Medications  Medication Sig Dispense Refill  . aspirin 81 MG tablet Take 81 mg by mouth every evening.     Marland Kitchen atenolol (TENORMIN) 50 MG tablet Take 50 mg by mouth every evening.     . calcium carbonate (TUMS - DOSED IN MG ELEMENTAL CALCIUM) 500 MG chewable tablet Chew 1 tablet by mouth daily as needed for indigestion or heartburn.    . cholecalciferol (VITAMIN D3) 25 MCG (1000 UT) tablet Take 2,000 Units by mouth every evening.    . clobetasol ointment (TEMOVATE) 0.05 % Apply a pea sized amount topically BID x 1-2 weeks prn flare (Patient taking differently: Apply 1 application topically 2 (two) times daily as needed (outbreaks). Use for 1 to 2 weeks) 30 g 1  . Coenzyme Q10 (CO Q 10) 100 MG CAPS Take 100 mg by mouth daily.     . hydrochlorothiazide (HYDRODIURIL) 12.5 MG tablet Take 12.5 mg by mouth daily.     Marland Kitchen ibuprofen (ADVIL,MOTRIN) 100 MG tablet Take 400-600 mg by mouth 2 (two) times daily as needed for pain.    . Magnesium 250  MG TABS Take 250 mg by mouth every evening.    . Multiple Vitamin (MULTIVITAMIN WITH MINERALS) TABS tablet Take 1 tablet by mouth daily.    . ondansetron (ZOFRAN) 8 MG tablet Take 1 tablet (8 mg total) by mouth 2 (two) times daily as needed (Nausea or vomiting). 30 tablet 1  . prochlorperazine (COMPAZINE) 10 MG tablet Take 1 tablet (10 mg total) by mouth every 6 (six) hours as needed (Nausea or vomiting). 30 tablet 1  . SUPER B COMPLEX/C PO Take 1 tablet by mouth daily.    . valACYclovir (VALTREX) 500 MG tablet Take one tab po BID for 3 days with flares. 30 tablet 1  . vitamin C (ASCORBIC ACID) 500 MG tablet Take 500 mg by mouth every evening.    . magic mouthwash w/lidocaine SOLN Take 5 mLs by mouth 3 (three) times daily. Swish and spit 5 mls TID as needed. 100 mL 1   No current facility-administered medications for this visit.     PHYSICAL EXAMINATION: ECOG PERFORMANCE STATUS: 1 - Symptomatic but completely ambulatory  Vitals:   11/10/18 1018  BP: 132/86  Pulse: 71  Resp: 17  Temp: 98.3 F (36.8 C)  SpO2: 96%   Filed Weights   11/10/18 1018  Weight: 154 lb 1.6 oz (69.9 kg)    GENERAL:alert, no distress and comfortable SKIN: skin color, texture, turgor are normal, no rashes or significant lesions (+) Hyperpigmented ring on left abdomen (+) small area of red rash of upper left chest, no blisters EYES: normal, Conjunctiva are pink and non-injected, sclera clear OROPHARYNX: (+) Mucositis with oral erythema   NECK: supple, thyroid normal size, non-tender, without nodularity LYMPH:  no palpable lymphadenopathy in the cervical, axillary or inguinal LUNGS: clear to auscultation and percussion with normal breathing effort HEART: regular rate & rhythm and no murmurs and no lower extremity edema ABDOMEN:abdomen soft, non-tender and normal bowel sounds Musculoskeletal:no cyanosis of digits and no clubbing  NEURO: alert & oriented x 3 with fluent speech, no focal motor/sensory  deficits  LABORATORY DATA:  I have reviewed the data as listed CBC Latest Ref Rng & Units 11/10/2018 11/03/2018 10/22/2018  WBC 4.0 - 10.5 K/uL 3.9(L) 7.3 7.4  Hemoglobin 12.0 - 15.0 g/dL 12.6 14.2 13.9  Hematocrit 36.0 - 46.0 % 37.0 42.0 41.4  Platelets 150 - 400 K/uL 204 290 313     CMP Latest Ref Rng & Units 11/10/2018 11/03/2018 10/22/2018  Glucose 70 - 99 mg/dL 113(H) 117(H) 110(H)  BUN 8 - 23 mg/dL _0 Creatinine 0.44 - 1.00 mg/dL 0.79 0.80 0.81  Sodium 135 - 145 mmol/L 141 142 143  Potassium 3.5 - 5.1 mmol/L 3.7 4.2 4.4  Chloride 98 - 111 mmol/L 107 109 107  CO2 22 - 32 mmol/L _1 Calcium 8.9 - 10.3 mg/dL 9.0 9.8 10.0  Total Protein 6.5 - 8.1 g/dL 6.9 7.6 7.8  Total Bilirubin 0.3 - 1.2 mg/dL 0.6 0.7 0.4  Alkaline Phos 38 - 126 U/L 74 78 92  AST 15 - 41 U/L _2 ALT 0 - 44 U/L 22 33 42      RADIOGRAPHIC STUDIES: I have personally reviewed the radiological images as listed and agreed with the findings in the report. No results found.   ASSESSMENT & PLAN:  Angelica Wright is a 63 y.o. female with    1.Anal Cancer,withpositiveleft pelvic lymph node, cT2N1bM0, stage IIIA -She was recently diagnosed in 09/2018.  -She has locally advanced squamous cell carcinoma of the anus(in anal canal and low rectum)which has spread to pelviclymph node.  -Her 03/2018 genetic testing was negative except for VUS of gene PMS2. . -She started standard treatment isconcurrentChemoradiation for 6 weeks with Mitomycin and 5-FU on week 1 and week 5 on 10/07/00/9. Will have PICC Line on week 1 and 5.  -She tolerated first cycle moderately well with diarrhea, mucositis and skin rashes. Will monitor closely. I encouraged her if she develops fever, dehydration or not eating well, she should contact clinic.  -Labs reviewed, CBC WNL and CMP is still pending. -Will f/u with her weekly with labs.  -F/u in 1 week  2. HTN and DM -She is on atenolol and HCTZ,not on diabetic  medication -Managed by her PCP -We discussed that her medications may be adjusted during chemo, will monitor closely.  3. Mucositis, dysphagia, secondary to chemo  -I will call in Magic Mouth wash to use for 2 minutes before each meal  -She can also use warm water, salt and soda  Mouth rinse  4. Small rash of upper left chest  -She  has a h/o of shingles on her back. I have low suspicion this is shingles recurrence. Will monitor closely.  -I recommend she start hydrocortisone cream on the rash  5. Diarrhea, Hemorrhoids  -She has hemorrhoids exacerbated by diarrhea.  -I recommend SITS bath and OTC cream  -If her diarrhea becomes uncontrolled I will call in Lomotil.    PLAN: -I filled Magic Mouth wash today  -Lab, f/u in 1 week   No problem-specific Assessment & Plan notes found for this encounter.   No orders of the defined types were placed in this encounter.  All questions were answered. The patient knows to call the clinic with any problems, questions or concerns. No barriers to learning was detected. I spent 15 minutes counseling the patient face to face. The total time spent in the appointment was 20 minutes and more than 50% was on counseling and review of test results     Truitt Merle, MD 11/10/2018   I, Joslyn Devon, am acting as scribe for Truitt Merle, MD.   I have reviewed the above documentation for accuracy and completeness, and I agree with the above.

## 2018-11-11 ENCOUNTER — Ambulatory Visit
Admission: RE | Admit: 2018-11-11 | Discharge: 2018-11-11 | Disposition: A | Discharge status: Home / Self Care | Payer: 59 | Source: Ambulatory Visit | Attending: Radiation Oncology | Admitting: Radiation Oncology

## 2018-11-11 DIAGNOSIS — R252 Cramp and spasm: Secondary | ICD-10-CM | POA: Diagnosis not present

## 2018-11-11 DIAGNOSIS — C210 Malignant neoplasm of anus, unspecified: Secondary | ICD-10-CM | POA: Diagnosis not present

## 2018-11-12 ENCOUNTER — Ambulatory Visit
Admission: RE | Admit: 2018-11-12 | Discharge: 2018-11-12 | Disposition: A | Discharge status: Home / Self Care | Payer: 59 | Source: Ambulatory Visit | Attending: Radiation Oncology | Admitting: Radiation Oncology

## 2018-11-12 DIAGNOSIS — R252 Cramp and spasm: Secondary | ICD-10-CM | POA: Diagnosis not present

## 2018-11-12 DIAGNOSIS — C210 Malignant neoplasm of anus, unspecified: Secondary | ICD-10-CM | POA: Diagnosis not present

## 2018-11-12 NOTE — Progress Notes (Signed)
Pt here for patient teaching.  Pt given Radiation and You booklet.  Reviewed areas of pertinence such as diarrhea, fatigue, hair loss, sexual and fertility changes, skin changes and urinary and bladder changes . Pt able to give teach back of to pat skin, use unscented/gentle soap, use baby wipes, have Imodium on hand and sitz bath,avoid applying anything to skin within 4 hours of treatment. Pt verbalizes understanding of information given and will contact nursing with any questions or concerns.     Angelica Rauen M. Anjel Perfetti RN, BSN      

## 2018-11-13 ENCOUNTER — Ambulatory Visit
Admission: RE | Admit: 2018-11-13 | Discharge: 2018-11-13 | Disposition: A | Discharge status: Home / Self Care | Payer: 59 | Source: Ambulatory Visit | Attending: Radiation Oncology | Admitting: Radiation Oncology

## 2018-11-13 DIAGNOSIS — R252 Cramp and spasm: Secondary | ICD-10-CM | POA: Diagnosis not present

## 2018-11-13 DIAGNOSIS — C210 Malignant neoplasm of anus, unspecified: Secondary | ICD-10-CM | POA: Diagnosis not present

## 2018-11-14 ENCOUNTER — Encounter: Payer: Self-pay | Admitting: Physical Therapy

## 2018-11-14 ENCOUNTER — Other Ambulatory Visit: Payer: Self-pay

## 2018-11-14 ENCOUNTER — Ambulatory Visit
Admission: RE | Admit: 2018-11-14 | Discharge: 2018-11-14 | Disposition: A | Discharge status: Home / Self Care | Payer: 59 | Source: Ambulatory Visit | Attending: Radiation Oncology | Admitting: Radiation Oncology

## 2018-11-14 ENCOUNTER — Ambulatory Visit: Payer: 59 | Attending: Hematology | Admitting: Physical Therapy

## 2018-11-14 DIAGNOSIS — C210 Malignant neoplasm of anus, unspecified: Secondary | ICD-10-CM | POA: Diagnosis not present

## 2018-11-14 DIAGNOSIS — E119 Type 2 diabetes mellitus without complications: Secondary | ICD-10-CM | POA: Insufficient documentation

## 2018-11-14 DIAGNOSIS — R252 Cramp and spasm: Secondary | ICD-10-CM | POA: Insufficient documentation

## 2018-11-14 DIAGNOSIS — M6281 Muscle weakness (generalized): Secondary | ICD-10-CM

## 2018-11-14 DIAGNOSIS — R279 Unspecified lack of coordination: Secondary | ICD-10-CM | POA: Insufficient documentation

## 2018-11-14 DIAGNOSIS — I10 Essential (primary) hypertension: Secondary | ICD-10-CM | POA: Insufficient documentation

## 2018-11-14 DIAGNOSIS — K649 Unspecified hemorrhoids: Secondary | ICD-10-CM | POA: Insufficient documentation

## 2018-11-14 DIAGNOSIS — R197 Diarrhea, unspecified: Secondary | ICD-10-CM | POA: Insufficient documentation

## 2018-11-14 DIAGNOSIS — K1231 Oral mucositis (ulcerative) due to antineoplastic therapy: Secondary | ICD-10-CM | POA: Insufficient documentation

## 2018-11-14 NOTE — Patient Instructions (Signed)
Access Code: [email or web address]  URL: [email or web address]  Date: 11/14/2018  Prepared by: Lovett Calender   Exercises  Ball squeeze with Kegel - 10 reps - 1 sets - 3 sec hold - 3x daily - 7x weekly  Supine Bridge - 10 reps - 1 sets - 3x daily - 7x weekly  Hooklying clam with kegel - 10 reps - 1 sets - 3x daily - 7x weekly  Supine Butterfly Groin Stretch - 3 reps - 1 sets - 30 sec hold - 1x daily - 7x weekly  Supine Hip Internal and External Rotation - 10 reps - 3 sets - 1x daily - 7x weekly

## 2018-11-14 NOTE — Progress Notes (Signed)
[place]   Telephone:(336) [phone] Fax:(336) [phone]   Clinic Follow up Note   Patient Care Team: Shirline Frees, MD as PCP - General (Family Medicine)  Date of Service:  11/17/2018  CHIEF COMPLAINT: F/u of anal cancer  SUMMARY OF ONCOLOGIC HISTORY:   Anal cancer ([place])   03/19/2018 Genetic Testing    Genetic testing performed through [facility]'s Common Hereditary Cancers Panel reported out on 03/10/2018 showed no pathogenic mutations. The Common Hereditary Cancer Panel offered by [facility] includes sequencing and/or deletion duplication testing of the following 47 genes: APC, ATM, AXIN2, BARD1, BMPR1A, BRCA1, BRCA2, BRIP1, CDH1, CDKN2A (p14ARF), CDKN2A (p16INK4a), CKD4, CHEK2, CTNNA1, DICER1, EPCAM (Deletion/duplication testing only), GREM1 (promoter region deletion/duplication testing only), KIT, MEN1, MLH1, MSH2, MSH3, MSH6, MUTYH, NBN, NF1, NHTL1, PALB2, PDGFRA, PMS2, POLD1, POLE, PTEN, RAD50, RAD51C, RAD51D, SDHB, SDHC, SDHD, SMAD4, SMARCA4. STK11, TP53, TSC1, TSC2, and VHL.  The following genes were evaluated for sequence changes only: SDHA and HOXB13 c.251G>A variant only.  A variant of uncertain significance (VUS) in a gene called PMS2 was also noted. c.1399G>A (p.Val467Ile)    08/14/2018 Imaging    MRI PELVIS W WO Contrast 08/14/18 IMPRESSION: 1. Centrally necrotic 1.8 cm enlarged lymph node versus nonspecific soft tissue mass in the left hemipelvis adjacent to the posterior pelvic sidewall. Further evaluation with biopsy and/or PET-CT is recommended.    09/17/2018 Pathology Results    Diagnosis 09/17/18 Lymph node, needle/core biopsy, left pelvic - SQUAMOUS CELL CARCINOMA. - SEE COMMENT. Microscopic Comment    10/09/2018 PET scan    PET 10/09/18  IMPRESSION: 1. 17 mm left pelvic sidewall lesion is markedly hypermetabolic compatible with known malignancy. 2. No additional definite hypermetabolic disease identified in the neck, chest, abdomen, or  pelvis. 3. Relatively diffuse FDG accumulation in the colon without focal increased uptake above background colonic levels in the region of the anus. 4. Focal FDG accumulation identified along the anterior left femoral neck and in the lumbosacral junction. No underlying bone lesions evident at these locations on CT images. Indeterminate finding although metastatic disease cannot be excluded.     10/13/2018 Initial Biopsy    Diagnosis 10/13/18 Anus, biopsy - SQUAMOUS CELL CARCINOMA. - SEE COMMENT.    10/14/2018 Initial Diagnosis    Anal cancer ([place])    10/14/2018 Cancer Staging    Staging form: Anus, AJCC 8th Edition - Clinical stage from 10/14/2018: Stage IIIA (cT2, cN1b, cM0) - Signed by Truitt Merle, MD on 10/14/2018    11/03/2018 -  Radiation Therapy    Concurrent chemoRt with Dr. Lisbeth Renshaw for 5-6 weeks starting 11/03/18    11/03/2018 -  Chemotherapy    Concurrent chemoRt with Mitomycin and 5-FU on week 1 and week 5 starting 11/03/18      CURRENT THERAPY:  Chemoradiation for 6 weeks with Mitomycin and 5-FU on week 1 and week 5starting 11/03/18  INTERVAL HISTORY:  Angelica Wright is here for a follow up. She presents to the clinic today with her husband. She notes she is doing well. She notes with treatment she has been more tired but her main issue is buttock pain from radiation. She denies discharge. For the pain she has been using preparation H and other lidocaine creams. She has not started SITs baths. She also notes diarrhea with urgency. She will go to the bathroom 4-5 times a day, not always diarrhea with lower output. She notes her appetite is decreasing mildly but she is able to maintain weight.    REVIEW  OF SYSTEMS:   Constitutional: Denies fevers, chills or abnormal weight loss (+) Fatigue  (+) Lower appetite  Eyes: Denies blurriness of vision Ears, nose, mouth, throat, and face: Denies mucositis or sore throat Respiratory: Denies cough, dyspnea or  wheezes Cardiovascular: Denies palpitation, chest discomfort or lower extremity swelling Gastrointestinal:  Denies nausea, heartburn or change in bowel habits (+) buttocks pain from radiation and hemorrhoids (+) Mild diarrhea, controlled  Skin: Denies abnormal skin rashes Lymphatics: Denies new lymphadenopathy or easy bruising Neurological:Denies numbness, tingling or new weaknesses Behavioral/Psych: Mood is stable, no new changes  All other systems were reviewed with the patient and are negative.  MEDICAL HISTORY:  Past Medical History:  Diagnosis Date  . Hyperparathyroidism ([place])    resolved after surgery  . Hypertension   . Osteoporosis    of the spine  . Rectocele   . Squamous cell cancer, anus ([facility])   . TMJ syndrome   . Type 2 Diabetes ([place]) 11/2013   controlled with diet/exer    SURGICAL HISTORY: Past Surgical History:  Procedure Laterality Date  . LYMPH NODE BIOPSY    . PARATHYROIDECTOMY  2000  . THERAPEUTIC ABORTION     x 2  . VULVA /PERINEUM BIOPSY  2012    I have reviewed the social history and family history with the patient and they are unchanged from previous note.  ALLERGIES:  is allergic to sulfa antibiotics.  MEDICATIONS:  Current Outpatient Medications  Medication Sig Dispense Refill  . aspirin 81 MG tablet Take 81 mg by mouth every evening.     Marland Kitchen atenolol (TENORMIN) 50 MG tablet Take 50 mg by mouth every evening.     . calcium carbonate (TUMS - DOSED IN MG ELEMENTAL CALCIUM) 500 MG chewable tablet Chew 1 tablet by mouth daily as needed for indigestion or heartburn.    . cholecalciferol (VITAMIN D3) 25 MCG (1000 UT) tablet Take 2,000 Units by mouth every evening.    . clobetasol ointment (TEMOVATE) 0.05 % Apply a pea sized amount topically BID x 1-2 weeks prn flare (Patient taking differently: Apply 1 application topically 2 (two) times daily as needed (outbreaks). Use for 1 to 2 weeks) 30 g 1  . Coenzyme Q10 (CO Q 10) 100 MG CAPS Take 100 mg by mouth  daily.     . hydrochlorothiazide (HYDRODIURIL) 12.5 MG tablet Take 12.5 mg by mouth daily.     Marland Kitchen ibuprofen (ADVIL,MOTRIN) 100 MG tablet Take 400-600 mg by mouth 2 (two) times daily as needed for pain.    . magic mouthwash w/lidocaine SOLN Take 5 mLs by mouth 3 (three) times daily. Swish and spit 5 mls TID as needed. 100 mL 1  . Magnesium 250 MG TABS Take 250 mg by mouth every evening.    . Multiple Vitamin (MULTIVITAMIN WITH MINERALS) TABS tablet Take 1 tablet by mouth daily.    . ondansetron (ZOFRAN) 8 MG tablet Take 1 tablet (8 mg total) by mouth 2 (two) times daily as needed (Nausea or vomiting). 30 tablet 1  . prochlorperazine (COMPAZINE) 10 MG tablet Take 1 tablet (10 mg total) by mouth every 6 (six) hours as needed (Nausea or vomiting). 30 tablet 1  . SUPER B COMPLEX/C PO Take 1 tablet by mouth daily.    . valACYclovir (VALTREX) 500 MG tablet Take one tab po BID for 3 days with flares. 30 tablet 1  . vitamin C (ASCORBIC ACID) 500 MG tablet Take 500 mg by mouth every evening.  No current facility-administered medications for this visit.     PHYSICAL EXAMINATION: ECOG PERFORMANCE STATUS: 1 - Symptomatic but completely ambulatory  Vitals:   11/17/18 1104  BP: (!) 142/73  Pulse: 92  Resp: 18  Temp: 98.5 F (36.9 C)  SpO2: 98%   Filed Weights   11/17/18 1104  Weight: 154 lb 1.6 oz (69.9 kg)    GENERAL:alert, no distress and comfortable SKIN: skin color, texture, turgor are normal, no rashes or significant lesions EYES: normal, Conjunctiva are pink and non-injected, sclera clear OROPHARYNX:no exudate, no erythema and lips, buccal mucosa, and tongue normal  NECK: supple, thyroid normal size, non-tender, without nodularity LYMPH:  no palpable lymphadenopathy in the cervical, axillary or inguinal LUNGS: clear to auscultation and percussion with normal breathing effort HEART: regular rate & rhythm and no murmurs and no lower extremity edema ABDOMEN:abdomen soft, non-tender and  normal bowel sounds Musculoskeletal:no cyanosis of digits and no clubbing  NEURO: alert & oriented x 3 with fluent speech, no focal motor/sensory deficits  EXTERNAL RECTAL EXAM: (+) external hemorrhoids (+) Skin erythema and hyperpigmentation, no skin breakdown, discharge or ulcers  LABORATORY DATA:  I have reviewed the data as listed CBC Latest Ref Rng & Units 11/17/2018 11/10/2018 11/03/2018  WBC 4.0 - 10.5 K/uL 2.2(L) 3.9(L) 7.3  Hemoglobin 12.0 - 15.0 g/dL 11.7(L) 12.6 14.2  Hematocrit 36.0 - 46.0 % 34.6(L) 37.0 42.0  Platelets 150 - 400 K/uL 57(L) 204 290     CMP Latest Ref Rng & Units 11/17/2018 11/10/2018 11/03/2018  Glucose 70 - 99 mg/dL 100(H) 113(H) 117(H)  BUN 8 - 23 mg/dL '14 14 14  ' Creatinine 0.44 - 1.00 mg/dL 0.74 0.79 0.80  Sodium 135 - 145 mmol/L 143 141 142  Potassium 3.5 - 5.1 mmol/L 3.7 3.7 4.2  Chloride 98 - 111 mmol/L 108 107 109  CO2 22 - 32 mmol/L '28 24 23  ' Calcium 8.9 - 10.3 mg/dL 9.2 9.0 9.8  Total Protein 6.5 - 8.1 g/dL 6.7 6.9 7.6  Total Bilirubin 0.3 - 1.2 mg/dL 0.4 0.6 0.7  Alkaline Phos 38 - 126 U/L 67 74 78  AST 15 - 41 U/L '17 19 24  ' ALT 0 - 44 U/L 18 22 33      RADIOGRAPHIC STUDIES: I have personally reviewed the radiological images as listed and agreed with the findings in the report. No results found.   ASSESSMENT & PLAN:  Angelica Wright is a 63 y.o. female with    1.Anal Cancer,withpositiveleft pelvic lymph node, cT2N1bM0, stage IIIA -She was recently diagnosed in 09/2018. -She has locally advanced squamous cell carcinoma of the anus(in anal canal and low rectum)which has spread to pelviclymph node. -Her 03/2018 genetic testing was negative except for VUS of gene PMS2. . -She started standard treatment isconcurrentChemoradiation for 6 weeks with Mitomycin and 5-FU on week 1 and week 5 on 10/07/00/9. Will have PICC Line on week 1 and 5.  -She is tolerating treatment moderately well with fatigue, mild diarrhea and moderate buttocks  pain from radiation.  -Labs reviewed, CBC shows pancytopenia with WBC at 2.2.Hg at 11.7, PLT at 57K and ANC at 1. Will repeat labs Thursday.  -I discussed this is from chemotherapy and this week she will be at highest risk for infection. I strongly advised her to stay away form large crowds and avoid those who are sick. I suggest she use mask.  -Will continue treatment with PICC line and Mitomycin and 5-FU on week 5.  -F/u in  1 week   2. HTN and DM -She is on atenolol and HCTZ,not on diabetic medication -Managed by her PCP -Will monitor closely, during chemotherapy  -BP increased to 142/73 today (1/13/20202)  3. Mucositis, dysphagia, secondary to chemo  -She has magic mouth wash, continue to use for 2 minutes before each meal  -much improved now  4. Diarrhea, Hemorrhoids -She has hemorrhoids exacerbated by diarrhea.  -Continue SITS bath and OTC cream  -Diarrhea controlled.   5. Pancytopenia -Secondary to chemotherapy -We discussed risk of infection, bleeding, and need for blood transfusion  -We will repeat CBC in 3 days, if she has significant neutropenia, I will inform radiation oncology    PLAN: PICC line on 1/27 early morning by radiology  Lab on 1/16  F/u next week with lab    No problem-specific Assessment & Plan notes found for this encounter.   Orders Placed This Encounter  Procedures  . IR Fluoro Guide CV Line Right    PICC line for chemo, please schedule for the first thing in the morning of 1/27    Standing Status:   Future    Standing Expiration Date:   01/16/2020    Order Specific Question:   Reason for exam:    Answer:   chemo    Order Specific Question:   Preferred Imaging Location?    Answer:   [facility]   All questions were answered. The patient knows to call the clinic with any problems, questions or concerns. No barriers to learning was detected. I spent 15 minutes counseling the patient face to face. The total time spent in the  appointment was 20 minutes and more than 50% was on counseling and review of test results     Truitt Merle, MD 11/17/2018   I, Joslyn Devon, am acting as scribe for Truitt Merle, MD.   I have reviewed the above documentation for accuracy and completeness, and I agree with the above.

## 2018-11-16 NOTE — Therapy (Addendum)
[facility] [address], [place], [place], [address] Phone: [phone]   Fax:  [phone]  Physical Therapy Evaluation  Patient Details  Name: Angelica Wright MRN: [id number] Date of Birth: 1956-04-10 Referring Provider (PT): Truitt Merle, MD   Encounter Date: 11/14/2018  PT End of Session - 11/16/18 1955    Visit Number  1    Date for PT Re-Evaluation  02/05/19    PT Start Time  0932    PT Stop Time  1011    PT Time Calculation (min)  39 min    Activity Tolerance  Patient tolerated treatment well    Behavior During Therapy  [facility] for tasks assessed/performed       Past Medical History:  Diagnosis Date  . Hyperparathyroidism ([place])    resolved after surgery  . Hypertension   . Osteoporosis    of the spine  . Rectocele   . Squamous cell cancer, anus (HCC)   . TMJ syndrome   . Type 2 Diabetes ([place]) 11/2013   controlled with diet/exer    Past Surgical History:  Procedure Laterality Date  . LYMPH NODE BIOPSY    . PARATHYROIDECTOMY  2000  . THERAPEUTIC ABORTION     x 2  . VULVA /PERINEUM BIOPSY  2012    There were no vitals filed for this visit.  Pt is having incontinence of bladder and bowel and has had for years but worsened due to cancer treatments. Pt is having strong urges. Pt has a lot of gas and sometimes can't tell the difference           Patient is accompained by:            Pertinent History            Limitations            How long can you sit comfortably?            How long can you stand comfortably?            How long can you walk comfortably?            Diagnostic tests            Patient Stated Goals       strenthen the pelvic floor strenthen the pelvic floor  Pain Assessment  Currently in Pain?       No/denies No/denies  Pain Score 0-No pain 0-No pain 0-No pain   0-No pain  Multiple Pain Sites                  [facility] PT Assessment - 11/17/18 0001      Assessment   Medical  Diagnosis  C21.0 (ICD-10-CM) - Anal cancer ([place])    Referring Provider (PT)  Truitt Merle, MD      Precautions   Precautions  None      Restrictions   Weight Bearing Restrictions  No      Balance Screen   Has the patient fallen in the past 6 months  No      [place] residence    Living Arrangements  Spouse/significant other      Prior Function   Level of Independence  Independent    Vocation  Part time employment   medical leave from the office     Cognition   Overall Cognitive Status  Within Functional Limits for tasks assessed  Posture/Postural Control   Posture/Postural Control  Postural limitations    Postural Limitations  Rounded Shoulders;Anterior pelvic tilt      ROM / Strength   AROM / PROM / Strength  AROM;PROM;Strength      AROM   Overall AROM Comments  lumbar flexion and ext 25% limted      PROM   Overall PROM Comments  hip ER/IR limited 25% bilat      Strength   Strength Assessment Site  Hip    Right/Left Hip  Right;Left    Right Hip Flexion  4/5    Right Hip External Rotation   4+/5    Right Hip ABduction  4/5    Left Hip Flexion  4+/5    Left Hip External Rotation  4+/5    Left Hip ABduction  4/5      Flexibility   Soft Tissue Assessment /Muscle Length  yes    Hamstrings  25% limited      Palpation   Palpation comment  lumbar paraspinals tight      Ambulation/Gait   Gait Pattern  Within Functional Limits                Objective measurements completed on examination: See above findings.    Pelvic Floor Special Questions - 11/17/18 0001    Are you Pregnant or attempting pregnancy?  Yes    Number of Pregnancies  2    Number of Vaginal Deliveries  2    Episiotomy Performed  Yes    Currently Sexually Active  No    Urinary Leakage  Yes    How often  not sure 3x/week    Pad use  2-3/day for fecal leakage    Activities that cause leaking  With strong urge    Urinary urgency  Yes    Fecal  incontinence  Yes       [facility] Adult PT Treatment/Exercise - 11/17/18 0001      Self-Care   Self-Care  Other Self-Care Comments    Other Self-Care Comments   initial HEP             PT Education - 11/16/18 1956    Education Details   Access Code: [email or web address]     Person(s) Educated  Patient    Methods  Explanation;Demonstration;Handout;Verbal cues;Tactile cues    Comprehension  Verbalized understanding;Returned demonstration       PT Short Term Goals - 11/17/18 0959      PT SHORT TERM GOAL #1   Title  pt will be ind with initial HEP for strengthening during chemo and radiation treatments    Baseline  was educated today    Time  1    Period  Days    Status  Achieved    Target Date  11/14/18        PT Long Term Goals - 11/17/18 0950      PT LONG TERM GOAL #1   Title  Pt will be ind with advanced HEP    Time  12    Period  Weeks    Status  New    Target Date  02/05/19      PT LONG TERM GOAL #2   Title  pt will be ind with toileting techniques    Time  12    Period  Weeks    Status  New    Target Date  02/05/19      PT LONG TERM GOAL #3   Title  Pt will report no incontinence of bowel due to improved pelvic floor strength andcoordination.    Time  12    Period  Weeks    Status  New    Target Date  02/05/19      PT LONG TERM GOAL #4   Title  Pt will be able to tell the difference between gas and bowel movement prior  to evacuation.      Time  12    Period  Weeks    Status  New    Target Date  02/05/19             Plan - 11/17/18 1009    Clinical Impression Statement  Pt presents to clinic due to upcoming cancer treatments causing increased weakness of already comprimisedpelvic floor muscles.  Pt has weakness of LE bilaterally.  She has decreased hamsting flexibility.  Decreased hip rotation ROM bilaterally.  Pt demonstrates decfreased core strength and coordination during functional movements such as sit to stand.  Pt will benefit from skilled PT to  address impariments and ensure she is progressing strength and coordination for best results before and after cancer treatments    History and Personal Factors relevant to plan of care:  multiple vaginal deliveries, anal cancer    Clinical Presentation  Evolving    Clinical Presentation due to:  pt is undergoing cancer treatments and expected to have worsening symptms    Clinical Decision Making  Low    Rehab Potential  Excellent    Clinical Impairments Affecting Rehab Potential  anal cancer, arthritis    PT Frequency  1x / week    PT Duration  12 weeks    PT Treatment/Interventions  ADLs/Self Care Home Management;Biofeedback;Electrical Stimulation;Moist Heat;Cryotherapy;Therapeutic activities;Therapeutic exercise;Neuromuscular re-education;Patient/family education;Manual techniques;Dry needling;Taping    PT Next Visit Plan  biofeedback, core and pelvic floor strengthening,     PT Home Exercise Plan  Access Code: [email or web address]     Recommended Other Services  eval 11/14/18    Consulted and Agree with Plan of Care  Patient       Patient will benefit from skilled therapeutic intervention in order to improve the following deficits and impairments:  Increased muscle spasms, Impaired tone, Decreased range of motion, Pain, Increased fascial restricitons, Decreased strength, Decreased coordination  Visit Diagnosis: Cramp and spasm  Muscle weakness (generalized)  Unspecified lack of coordination     Problem List Patient Active Problem List   Diagnosis Date Noted  . Anal cancer ([place]) 10/14/2018  . Diabetes ([place]) 09/29/2018  . Genetic testing 03/19/2018  . Family history of breast cancer   . Family history of uterine cancer   . Osteopenia 07/06/2015  . Essential hypertension 07/06/2015  . Atrophic vaginitis 07/06/2015  . Lichen sclerosus et atrophicus of the vulva 07/06/2015    Zannie Cove, PT 11/17/2018, 10:56 AM  [facility] [address] [place], [place], [address] Phone: [phone]   Fax:  [phone]  Name: Angelica Wright MRN: [id number] Date of Birth: Apr 03, 1956

## 2018-11-17 ENCOUNTER — Ambulatory Visit
Admission: RE | Admit: 2018-11-17 | Discharge: 2018-11-17 | Disposition: A | Discharge status: Home / Self Care | Payer: 59 | Source: Ambulatory Visit | Attending: Radiation Oncology | Admitting: Radiation Oncology

## 2018-11-17 ENCOUNTER — Inpatient Hospital Stay: Payer: 59

## 2018-11-17 ENCOUNTER — Inpatient Hospital Stay (HOSPITAL_BASED_OUTPATIENT_CLINIC_OR_DEPARTMENT_OTHER): Payer: 59 | Admitting: Hematology

## 2018-11-17 ENCOUNTER — Encounter: Payer: Self-pay | Admitting: Hematology

## 2018-11-17 VITALS — BP 142/73 | HR 92 | Temp 98.5°F | Resp 18 | Ht 61.0 in | Wt 154.1 lb

## 2018-11-17 DIAGNOSIS — I10 Essential (primary) hypertension: Secondary | ICD-10-CM | POA: Diagnosis not present

## 2018-11-17 DIAGNOSIS — D61810 Antineoplastic chemotherapy induced pancytopenia: Secondary | ICD-10-CM

## 2018-11-17 DIAGNOSIS — E119 Type 2 diabetes mellitus without complications: Secondary | ICD-10-CM

## 2018-11-17 DIAGNOSIS — K649 Unspecified hemorrhoids: Secondary | ICD-10-CM

## 2018-11-17 DIAGNOSIS — C210 Malignant neoplasm of anus, unspecified: Secondary | ICD-10-CM | POA: Diagnosis not present

## 2018-11-17 DIAGNOSIS — E1169 Type 2 diabetes mellitus with other specified complication: Secondary | ICD-10-CM

## 2018-11-17 DIAGNOSIS — R252 Cramp and spasm: Secondary | ICD-10-CM | POA: Diagnosis not present

## 2018-11-17 DIAGNOSIS — R197 Diarrhea, unspecified: Secondary | ICD-10-CM

## 2018-11-17 DIAGNOSIS — K1231 Oral mucositis (ulcerative) due to antineoplastic therapy: Secondary | ICD-10-CM

## 2018-11-17 LAB — CMP (CANCER CENTER ONLY)
ALT: 18 U/L (ref 0–44)
AST: 17 U/L (ref 15–41)
Albumin: 3.5 g/dL (ref 3.5–5.0)
Alkaline Phosphatase: 67 U/L (ref 38–126)
Anion gap: 7 (ref 5–15)
BUN: 14 mg/dL (ref 8–23)
CALCIUM: 9.2 mg/dL (ref 8.9–10.3)
CO2: 28 mmol/L (ref 22–32)
Chloride: 108 mmol/L (ref 98–111)
Creatinine: 0.74 mg/dL (ref 0.44–1.00)
GFR, Est AFR Am: >60 mL/min (ref >60)
GFR, Estimated: >60 mL/min (ref >60)
Glucose, Bld: 100 mg/dL — ABNORMAL HIGH (ref 70–99)
Potassium: 3.7 mmol/L (ref 3.5–5.1)
Sodium: 143 mmol/L (ref 135–145)
Total Bilirubin: 0.4 mg/dL (ref 0.3–1.2)
Total Protein: 6.7 g/dL (ref 6.5–8.1)

## 2018-11-17 LAB — CBC WITH DIFFERENTIAL (CANCER CENTER ONLY)
Abs Immature Granulocytes: 0.01 10*3/uL (ref 0.00–0.07)
Basophils Absolute: 0 10*3/uL (ref 0.0–0.1)
Basophils Relative: 0 %
Eosinophils Absolute: 0.2 10*3/uL (ref 0.0–0.5)
Eosinophils Relative: 8 %
HCT: 34.6 % — ABNORMAL LOW (ref 36.0–46.0)
Hemoglobin: 11.7 g/dL — ABNORMAL LOW (ref 12.0–15.0)
IMMATURE GRANULOCYTES: 1 %
LYMPHS PCT: 24 %
Lymphs Abs: 0.5 10*3/uL — ABNORMAL LOW (ref 0.7–4.0)
MCH: 31.8 pg (ref 26.0–34.0)
MCHC: 33.8 g/dL (ref 30.0–36.0)
MCV: 94 fL (ref 80.0–100.0)
Monocytes Absolute: 0.5 10*3/uL (ref 0.1–1.0)
Monocytes Relative: 21 %
Neutro Abs: 1 10*3/uL — ABNORMAL LOW (ref 1.7–7.7)
Neutrophils Relative %: 46 %
Platelet Count: 57 10*3/uL — ABNORMAL LOW (ref 150–400)
RBC: 3.68 MIL/uL — ABNORMAL LOW (ref 3.87–5.11)
RDW: 12.1 % (ref 11.5–15.5)
WBC Count: 2.2 10*3/uL — ABNORMAL LOW (ref 4.0–10.5)
nRBC: 0 % (ref 0.0–0.2)

## 2018-11-17 NOTE — Addendum Note (Signed)
Addended by: Lovett Calender D on: 11/17/2018 10:59 AM   Modules accepted: Orders

## 2018-11-18 ENCOUNTER — Telehealth: Payer: Self-pay | Admitting: Hematology

## 2018-11-18 ENCOUNTER — Ambulatory Visit
Admission: RE | Admit: 2018-11-18 | Discharge: 2018-11-18 | Disposition: A | Discharge status: Home / Self Care | Payer: 59 | Source: Ambulatory Visit | Attending: Radiation Oncology | Admitting: Radiation Oncology

## 2018-11-18 ENCOUNTER — Telehealth: Payer: Self-pay | Admitting: *Deleted

## 2018-11-18 DIAGNOSIS — C210 Malignant neoplasm of anus, unspecified: Secondary | ICD-10-CM | POA: Diagnosis not present

## 2018-11-18 DIAGNOSIS — R252 Cramp and spasm: Secondary | ICD-10-CM | POA: Diagnosis not present

## 2018-11-18 NOTE — Telephone Encounter (Signed)
Called to inform the patient of the scheduled lab appointment per 01/13 los.  Patient aware of appointment.

## 2018-11-18 NOTE — Telephone Encounter (Signed)
Medical records faxed to [facility] RN; RID [phone]

## 2018-11-19 ENCOUNTER — Ambulatory Visit
Admission: RE | Admit: 2018-11-19 | Discharge: 2018-11-19 | Disposition: A | Discharge status: Home / Self Care | Payer: 59 | Source: Ambulatory Visit | Attending: Radiation Oncology | Admitting: Radiation Oncology

## 2018-11-19 DIAGNOSIS — K649 Unspecified hemorrhoids: Secondary | ICD-10-CM | POA: Diagnosis not present

## 2018-11-19 DIAGNOSIS — I10 Essential (primary) hypertension: Secondary | ICD-10-CM | POA: Diagnosis not present

## 2018-11-19 DIAGNOSIS — C210 Malignant neoplasm of anus, unspecified: Secondary | ICD-10-CM | POA: Diagnosis not present

## 2018-11-19 DIAGNOSIS — E119 Type 2 diabetes mellitus without complications: Secondary | ICD-10-CM | POA: Diagnosis not present

## 2018-11-19 DIAGNOSIS — R252 Cramp and spasm: Secondary | ICD-10-CM | POA: Diagnosis not present

## 2018-11-20 ENCOUNTER — Inpatient Hospital Stay: Payer: 59

## 2018-11-20 ENCOUNTER — Ambulatory Visit
Admission: RE | Admit: 2018-11-20 | Discharge: 2018-11-20 | Disposition: A | Discharge status: Home / Self Care | Payer: 59 | Source: Ambulatory Visit | Attending: Radiation Oncology | Admitting: Radiation Oncology

## 2018-11-20 DIAGNOSIS — C210 Malignant neoplasm of anus, unspecified: Secondary | ICD-10-CM

## 2018-11-20 DIAGNOSIS — R252 Cramp and spasm: Secondary | ICD-10-CM | POA: Diagnosis not present

## 2018-11-20 LAB — CBC WITH DIFFERENTIAL (CANCER CENTER ONLY)
Abs Immature Granulocytes: 0.03 10*3/uL (ref 0.00–0.07)
Basophils Absolute: 0 10*3/uL (ref 0.0–0.1)
Basophils Relative: 1 %
EOS PCT: 6 %
Eosinophils Absolute: 0.1 10*3/uL (ref 0.0–0.5)
HCT: 33.8 % — ABNORMAL LOW (ref 36.0–46.0)
HEMOGLOBIN: 11.7 g/dL — AB (ref 12.0–15.0)
Immature Granulocytes: 1 %
LYMPHS PCT: 15 %
Lymphs Abs: 0.3 10*3/uL — ABNORMAL LOW (ref 0.7–4.0)
MCH: 32.1 pg (ref 26.0–34.0)
MCHC: 34.6 g/dL (ref 30.0–36.0)
MCV: 92.9 fL (ref 80.0–100.0)
Monocytes Absolute: 0.6 10*3/uL (ref 0.1–1.0)
Monocytes Relative: 27 %
Neutro Abs: 1.1 10*3/uL — ABNORMAL LOW (ref 1.7–7.7)
Neutrophils Relative %: 50 %
Platelet Count: 106 10*3/uL — ABNORMAL LOW (ref 150–400)
RBC: 3.64 MIL/uL — ABNORMAL LOW (ref 3.87–5.11)
RDW: 12.3 % (ref 11.5–15.5)
WBC Count: 2.1 10*3/uL — ABNORMAL LOW (ref 4.0–10.5)
nRBC: 0 % (ref 0.0–0.2)

## 2018-11-20 LAB — CMP (CANCER CENTER ONLY)
ALT: 24 U/L (ref 0–44)
AST: 21 U/L (ref 15–41)
Albumin: 3.5 g/dL (ref 3.5–5.0)
Alkaline Phosphatase: 67 U/L (ref 38–126)
Anion gap: 10 (ref 5–15)
BUN: 10 mg/dL (ref 8–23)
CO2: 25 mmol/L (ref 22–32)
Calcium: 9.1 mg/dL (ref 8.9–10.3)
Chloride: 108 mmol/L (ref 98–111)
Creatinine: 0.75 mg/dL (ref 0.44–1.00)
GFR, Est AFR Am: >60 mL/min (ref >60)
GFR, Estimated: >60 mL/min (ref >60)
GLUCOSE: 113 mg/dL — AB (ref 70–99)
Potassium: 3.5 mmol/L (ref 3.5–5.1)
Sodium: 143 mmol/L (ref 135–145)
Total Bilirubin: 0.4 mg/dL (ref 0.3–1.2)
Total Protein: 6.9 g/dL (ref 6.5–8.1)

## 2018-11-21 ENCOUNTER — Ambulatory Visit
Admission: RE | Admit: 2018-11-21 | Discharge: 2018-11-21 | Disposition: A | Discharge status: Home / Self Care | Payer: 59 | Source: Ambulatory Visit | Attending: Radiation Oncology | Admitting: Radiation Oncology

## 2018-11-21 DIAGNOSIS — R252 Cramp and spasm: Secondary | ICD-10-CM | POA: Diagnosis not present

## 2018-11-21 DIAGNOSIS — C210 Malignant neoplasm of anus, unspecified: Secondary | ICD-10-CM | POA: Diagnosis not present

## 2018-11-24 ENCOUNTER — Encounter: Payer: Self-pay | Admitting: Hematology

## 2018-11-24 ENCOUNTER — Inpatient Hospital Stay (HOSPITAL_BASED_OUTPATIENT_CLINIC_OR_DEPARTMENT_OTHER): Payer: 59 | Admitting: Hematology

## 2018-11-24 ENCOUNTER — Inpatient Hospital Stay: Payer: 59

## 2018-11-24 ENCOUNTER — Ambulatory Visit
Admission: RE | Admit: 2018-11-24 | Discharge: 2018-11-24 | Disposition: A | Discharge status: Home / Self Care | Payer: 59 | Source: Ambulatory Visit | Attending: Radiation Oncology | Admitting: Radiation Oncology

## 2018-11-24 ENCOUNTER — Telehealth: Payer: Self-pay | Admitting: Hematology

## 2018-11-24 VITALS — BP 121/72 | HR 71 | Temp 98.5°F | Resp 18 | Ht 61.0 in | Wt 152.6 lb

## 2018-11-24 DIAGNOSIS — C210 Malignant neoplasm of anus, unspecified: Secondary | ICD-10-CM

## 2018-11-24 DIAGNOSIS — K1231 Oral mucositis (ulcerative) due to antineoplastic therapy: Secondary | ICD-10-CM

## 2018-11-24 DIAGNOSIS — K649 Unspecified hemorrhoids: Secondary | ICD-10-CM

## 2018-11-24 DIAGNOSIS — I10 Essential (primary) hypertension: Secondary | ICD-10-CM

## 2018-11-24 DIAGNOSIS — E119 Type 2 diabetes mellitus without complications: Secondary | ICD-10-CM

## 2018-11-24 DIAGNOSIS — R197 Diarrhea, unspecified: Secondary | ICD-10-CM

## 2018-11-24 DIAGNOSIS — E1169 Type 2 diabetes mellitus with other specified complication: Secondary | ICD-10-CM

## 2018-11-24 DIAGNOSIS — D61810 Antineoplastic chemotherapy induced pancytopenia: Secondary | ICD-10-CM

## 2018-11-24 DIAGNOSIS — R252 Cramp and spasm: Secondary | ICD-10-CM | POA: Diagnosis not present

## 2018-11-24 LAB — CBC WITH DIFFERENTIAL (CANCER CENTER ONLY)
ABS IMMATURE GRANULOCYTES: 0.02 10*3/uL (ref 0.00–0.07)
Basophils Absolute: 0 10*3/uL (ref 0.0–0.1)
Basophils Relative: 1 %
Eosinophils Absolute: 0 10*3/uL (ref 0.0–0.5)
Eosinophils Relative: 1 %
HCT: 34.9 % — ABNORMAL LOW (ref 36.0–46.0)
Hemoglobin: 12 g/dL (ref 12.0–15.0)
Immature Granulocytes: 1 %
Lymphocytes Relative: 9 %
Lymphs Abs: 0.3 10*3/uL — ABNORMAL LOW (ref 0.7–4.0)
MCH: 32.3 pg (ref 26.0–34.0)
MCHC: 34.4 g/dL (ref 30.0–36.0)
MCV: 94.1 fL (ref 80.0–100.0)
Monocytes Absolute: 0.4 10*3/uL (ref 0.1–1.0)
Monocytes Relative: 12 %
NEUTROS ABS: 2.8 10*3/uL (ref 1.7–7.7)
Neutrophils Relative %: 76 %
PLATELETS: 245 10*3/uL (ref 150–400)
RBC: 3.71 MIL/uL — ABNORMAL LOW (ref 3.87–5.11)
RDW: 13.2 % (ref 11.5–15.5)
WBC Count: 3.7 10*3/uL — ABNORMAL LOW (ref 4.0–10.5)
nRBC: 0 % (ref 0.0–0.2)

## 2018-11-24 LAB — CMP (CANCER CENTER ONLY)
ALBUMIN: 3.7 g/dL (ref 3.5–5.0)
ALT: 26 U/L (ref 0–44)
AST: 22 U/L (ref 15–41)
Alkaline Phosphatase: 72 U/L (ref 38–126)
Anion gap: 10 (ref 5–15)
BUN: 8 mg/dL (ref 8–23)
CO2: 26 mmol/L (ref 22–32)
Calcium: 9 mg/dL (ref 8.9–10.3)
Chloride: 108 mmol/L (ref 98–111)
Creatinine: 0.76 mg/dL (ref 0.44–1.00)
GFR, Est AFR Am: >60 mL/min (ref >60)
GFR, Estimated: >60 mL/min (ref >60)
GLUCOSE: 106 mg/dL — AB (ref 70–99)
POTASSIUM: 3.5 mmol/L (ref 3.5–5.1)
Sodium: 144 mmol/L (ref 135–145)
Total Bilirubin: 0.5 mg/dL (ref 0.3–1.2)
Total Protein: 7 g/dL (ref 6.5–8.1)

## 2018-11-24 NOTE — Telephone Encounter (Signed)
No 1/20 los - printed out calender for patient .

## 2018-11-24 NOTE — Progress Notes (Signed)
[name]   [phone] [phone]   Clinic Follow up Note   Patient Care Team: Shirline Frees, MD as PCP - General (Family Medicine)  Date of Service:  11/24/2018  CHIEF COMPLAINT: F/u of anal cancer  SUMMARY OF ONCOLOGIC HISTORY:   Anal cancer ([place])   03/19/2018 Genetic Testing    Genetic testing performed through [facility]'s Common Hereditary Cancers Panel reported out on 03/10/2018 showed no pathogenic mutations. The Common Hereditary Cancer Panel offered by [facility] includes sequencing and/or deletion duplication testing of the following 47 genes: APC, ATM, AXIN2, BARD1, BMPR1A, BRCA1, BRCA2, BRIP1, CDH1, CDKN2A (p14ARF), CDKN2A (p16INK4a), CKD4, CHEK2, CTNNA1, DICER1, EPCAM (Deletion/duplication testing only), GREM1 (promoter region deletion/duplication testing only), KIT, MEN1, MLH1, MSH2, MSH3, MSH6, MUTYH, NBN, NF1, NHTL1, PALB2, PDGFRA, PMS2, POLD1, POLE, PTEN, RAD50, RAD51C, RAD51D, SDHB, SDHC, SDHD, SMAD4, SMARCA4. STK11, TP53, TSC1, TSC2, and VHL.  The following genes were evaluated for sequence changes only: SDHA and HOXB13 c.251G>A variant only.  A variant of uncertain significance (VUS) in a gene called PMS2 was also noted. c.1399G>A (p.Val467Ile)    08/14/2018 Imaging    MRI PELVIS W WO Contrast 08/14/18 IMPRESSION: 1. Centrally necrotic 1.8 cm enlarged lymph node versus nonspecific soft tissue mass in the left hemipelvis adjacent to the posterior pelvic sidewall. Further evaluation with biopsy and/or PET-CT is recommended.    09/17/2018 Pathology Results    Diagnosis 09/17/18 Lymph node, needle/core biopsy, left pelvic - SQUAMOUS CELL CARCINOMA. - SEE COMMENT. Microscopic Comment    10/09/2018 PET scan    PET 10/09/18  IMPRESSION: 1. 17 mm left pelvic sidewall lesion is markedly hypermetabolic compatible with known malignancy. 2. No additional definite hypermetabolic disease identified in the neck, chest, abdomen, or  pelvis. 3. Relatively diffuse FDG accumulation in the colon without focal increased uptake above background colonic levels in the region of the anus. 4. Focal FDG accumulation identified along the anterior left femoral neck and in the lumbosacral junction. No underlying bone lesions evident at these locations on CT images. Indeterminate finding although metastatic disease cannot be excluded.     10/13/2018 Initial Biopsy    Diagnosis 10/13/18 Anus, biopsy - SQUAMOUS CELL CARCINOMA. - SEE COMMENT.    10/14/2018 Initial Diagnosis    Anal cancer ([place])    10/14/2018 Cancer Staging    Staging form: Anus, AJCC 8th Edition - Clinical stage from 10/14/2018: Stage IIIA (cT2, cN1b, cM0) - Signed by Truitt Merle, MD on 10/14/2018    11/03/2018 -  Radiation Therapy    Concurrent chemoRt with Dr. Lisbeth Renshaw for 5-6 weeks starting 11/03/18    11/03/2018 -  Chemotherapy    Concurrent chemoRt with Mitomycin and 5-FU on week 1 and week 5 starting 11/03/18      CURRENT THERAPY:  Chemoradiation for 6 weeks with Mitomycin and 5-FU on week 1 and week 5starting 11/03/18  INTERVAL HISTORY:  Angelica Wright is here for a follow up of treatment. She presents to the clinic today with her husband. She notes having complete hair loss. She is tolerating treatment moderately well with buttock soreness which is stable. Her diarrhea is stable and controlled. She has not needed to use imodium. She denied fever or chills.    REVIEW OF SYSTEMS:   Constitutional: Denies fevers, chills or abnormal weight loss (+) complete hair loss  Eyes: Denies blurriness of vision Ears, nose, mouth, throat, and face: Denies mucositis or sore throat Respiratory: Denies cough, dyspnea or wheezes Cardiovascular: Denies palpitation, chest discomfort  or lower extremity swelling Gastrointestinal:  Denies nausea, heartburn (+) stable, controlled diarrhea (+) buttock soreness Skin: Denies abnormal skin rashes Lymphatics: Denies new  lymphadenopathy or easy bruising Neurological:Denies numbness, tingling or new weaknesses Behavioral/Psych: Mood is stable, no new changes  All other systems were reviewed with the patient and are negative.  MEDICAL HISTORY:  Past Medical History:  Diagnosis Date  . Hyperparathyroidism ([place])    resolved after surgery  . Hypertension   . Osteoporosis    of the spine  . Rectocele   . Squamous cell cancer, anus (HCC)   . TMJ syndrome   . Type 2 Diabetes ([place]) 11/2013   controlled with diet/exer    SURGICAL HISTORY: Past Surgical History:  Procedure Laterality Date  . LYMPH NODE BIOPSY    . PARATHYROIDECTOMY  2000  . THERAPEUTIC ABORTION     x 2  . VULVA /PERINEUM BIOPSY  2012    I have reviewed the social history and family history with the patient and they are unchanged from previous note.  ALLERGIES:  is allergic to sulfa antibiotics.  MEDICATIONS:  Current Outpatient Medications  Medication Sig Dispense Refill  . aspirin 81 MG tablet Take 81 mg by mouth every evening.     Marland Kitchen atenolol (TENORMIN) 50 MG tablet Take 50 mg by mouth every evening.     . clobetasol ointment (TEMOVATE) 0.05 % Apply a pea sized amount topically BID x 1-2 weeks prn flare 30 g 1  . magic mouthwash w/lidocaine SOLN Take 5 mLs by mouth 3 (three) times daily. Swish and spit 5 mls TID as needed. 100 mL 1  . Multiple Vitamin (MULTIVITAMIN WITH MINERALS) TABS tablet Take 1 tablet by mouth daily.    . ondansetron (ZOFRAN) 8 MG tablet Take 1 tablet (8 mg total) by mouth 2 (two) times daily as needed (Nausea or vomiting). 30 tablet 1  . prochlorperazine (COMPAZINE) 10 MG tablet Take 1 tablet (10 mg total) by mouth every 6 (six) hours as needed (Nausea or vomiting). 30 tablet 1  . vitamin C (ASCORBIC ACID) 500 MG tablet Take 500 mg by mouth every evening.    . calcium carbonate (TUMS - DOSED IN MG ELEMENTAL CALCIUM) 500 MG chewable tablet Chew 1 tablet by mouth daily as needed for indigestion or heartburn.      . cholecalciferol (VITAMIN D3) 25 MCG (1000 UT) tablet Take 2,000 Units by mouth every evening.    . Coenzyme Q10 (CO Q 10) 100 MG CAPS Take 100 mg by mouth daily.     . hydrochlorothiazide (HYDRODIURIL) 12.5 MG tablet Take 12.5 mg by mouth daily.     Marland Kitchen ibuprofen (ADVIL,MOTRIN) 100 MG tablet Take 400-600 mg by mouth 2 (two) times daily as needed for pain.    . Magnesium 250 MG TABS Take 250 mg by mouth every evening.    . SUPER B COMPLEX/C PO Take 1 tablet by mouth daily.    . valACYclovir (VALTREX) 500 MG tablet Take one tab po BID for 3 days with flares. (Patient not taking: Reported on 11/24/2018) 30 tablet 1   No current facility-administered medications for this visit.     PHYSICAL EXAMINATION: ECOG PERFORMANCE STATUS: 1 - Symptomatic but completely ambulatory  Vitals:   11/24/18 0831  BP: 121/72  Pulse: 71  Resp: 18  Temp: 98.5 F (36.9 C)  SpO2: 98%   Filed Weights   11/24/18 0831  Weight: 152 lb 9.6 oz (69.2 kg)    GENERAL:alert, no distress  and comfortable SKIN: skin color, texture, turgor are normal, no rashes or significant lesions (+) dry skin of hands EYES: normal, Conjunctiva are pink and non-injected, sclera clear OROPHARYNX:no exudate, no erythema and lips, buccal mucosa, and tongue normal  NECK: supple, thyroid normal size, non-tender, without nodularity LYMPH:  no palpable lymphadenopathy in the cervical, axillary or inguinal LUNGS: clear to auscultation and percussion with normal breathing effort HEART: regular rate & rhythm and no murmurs and no lower extremity edema ABDOMEN:abdomen soft, non-tender and normal bowel sounds Musculoskeletal:no cyanosis of digits and no clubbing  NEURO: alert & oriented x 3 with fluent speech, no focal motor/sensory deficits  LABORATORY DATA:  I have reviewed the data as listed CBC Latest Ref Rng & Units 11/24/2018 11/20/2018 11/17/2018  WBC 4.0 - 10.5 K/uL 3.7(L) 2.1(L) 2.2(L)  Hemoglobin 12.0 - 15.0 g/dL 12.0 11.7(L)  11.7(L)  Hematocrit 36.0 - 46.0 % 34.9(L) 33.8(L) 34.6(L)  Platelets 150 - 400 K/uL 245 106(L) 57(L)     CMP Latest Ref Rng & Units 11/24/2018 11/20/2018 11/17/2018  Glucose 70 - 99 mg/dL 106(H) 113(H) 100(H)  BUN 8 - 23 mg/dL '8 10 14  ' Creatinine 0.44 - 1.00 mg/dL 0.76 0.75 0.74  Sodium 135 - 145 mmol/L 144 143 143  Potassium 3.5 - 5.1 mmol/L 3.5 3.5 3.7  Chloride 98 - 111 mmol/L 108 108 108  CO2 22 - 32 mmol/L '26 25 28  ' Calcium 8.9 - 10.3 mg/dL 9.0 9.1 9.2  Total Protein 6.5 - 8.1 g/dL 7.0 6.9 6.7  Total Bilirubin 0.3 - 1.2 mg/dL 0.5 0.4 0.4  Alkaline Phos 38 - 126 U/L 72 67 67  AST 15 - 41 U/L '22 21 17  ' ALT 0 - 44 U/L '26 24 18      ' RADIOGRAPHIC STUDIES: I have personally reviewed the radiological images as listed and agreed with the findings in the report. No results found.   ASSESSMENT & PLAN:  Angelica Wright is a 63 y.o. female with   1.Anal Cancer,withpositiveleft pelvic lymph node, cT2N1bM0, stage IIIA -She was recently diagnosed in 09/2018. -She has locally advanced squamous cell carcinoma of the anus(in anal canal and low rectum)which has spread to pelviclymph node. -Her 03/2018 genetic testing was negative except for VUS of gene PMS2. -Shestartedstandard treatment isconcurrentChemoradiation for 6 weeks with Mitomycin and 5-FU on week 1 and week 5on 10/07/00/9. Will have PICC Line on week 1 and 5.  -She is tolerating treatment moderately well with complete hair loss, fatigue, mild diarrhea and moderate buttocks pain from radiation.  -Labs reviewed, Anemia resolved, WBC at 3.7, Glucose at 106, otherwise labs WNL. She will proceed with week 5 chemo next week.  -F/u in 1 week   2. HTN and DM -She is on atenolol and HCTZ,not on diabetic medication -Managed by her PCP -Will monitor closely, during chemotherapy  -BP normal today   3. Mucositis, dysphagia, secondary to chemo -She has magic mouth wash, continue to use for 2 minutes before each meal  -much  improved  4. Diarrhea, Hemorrhoids -She has hemorrhoids exacerbated by diarrhea.  -Continue SITS bath and OTC cream  -Diarrhea controlled, stable. No need for imodium currently   5. Pancytopenia -Secondary to chemotherapy -We discussed risk of infection, bleeding, and need for blood transfusion  -CBC much improved, Anemia and thrombocytopenia resolved, WBC at 3.7 today (11/24/18)   PLAN: -PICC line, Lab, f/u and treatment in 1 week     No problem-specific Assessment & Plan notes found for this  encounter.   No orders of the defined types were placed in this encounter.  All questions were answered. The patient knows to call the clinic with any problems, questions or concerns. No barriers to learning was detected. I spent 10 minutes counseling the patient face to face. The total time spent in the appointment was 15 minutes and more than 50% was on counseling and review of test results     Truitt Merle, MD 11/24/2018   I, Joslyn Devon, am acting as scribe for Truitt Merle, MD.   I have reviewed the above documentation for accuracy and completeness, and I agree with the above.

## 2018-11-25 ENCOUNTER — Ambulatory Visit
Admission: RE | Admit: 2018-11-25 | Discharge: 2018-11-25 | Disposition: A | Discharge status: Home / Self Care | Payer: 59 | Source: Ambulatory Visit | Attending: Radiation Oncology | Admitting: Radiation Oncology

## 2018-11-25 DIAGNOSIS — C210 Malignant neoplasm of anus, unspecified: Secondary | ICD-10-CM | POA: Diagnosis not present

## 2018-11-25 DIAGNOSIS — R252 Cramp and spasm: Secondary | ICD-10-CM | POA: Diagnosis not present

## 2018-11-26 ENCOUNTER — Ambulatory Visit
Admission: RE | Admit: 2018-11-26 | Discharge: 2018-11-26 | Disposition: A | Discharge status: Home / Self Care | Payer: 59 | Source: Ambulatory Visit | Attending: Radiation Oncology | Admitting: Radiation Oncology

## 2018-11-26 DIAGNOSIS — C210 Malignant neoplasm of anus, unspecified: Secondary | ICD-10-CM | POA: Diagnosis not present

## 2018-11-26 DIAGNOSIS — R252 Cramp and spasm: Secondary | ICD-10-CM | POA: Diagnosis not present

## 2018-11-27 ENCOUNTER — Ambulatory Visit
Admission: RE | Admit: 2018-11-27 | Discharge: 2018-11-27 | Disposition: A | Discharge status: Home / Self Care | Payer: 59 | Source: Ambulatory Visit | Attending: Radiation Oncology | Admitting: Radiation Oncology

## 2018-11-27 DIAGNOSIS — C210 Malignant neoplasm of anus, unspecified: Secondary | ICD-10-CM | POA: Diagnosis not present

## 2018-11-27 DIAGNOSIS — R252 Cramp and spasm: Secondary | ICD-10-CM | POA: Diagnosis not present

## 2018-11-28 ENCOUNTER — Other Ambulatory Visit: Payer: Self-pay | Admitting: Physician Assistant

## 2018-11-28 ENCOUNTER — Ambulatory Visit
Admission: RE | Admit: 2018-11-28 | Discharge: 2018-11-28 | Disposition: A | Discharge status: Home / Self Care | Payer: 59 | Source: Ambulatory Visit | Attending: Radiation Oncology | Admitting: Radiation Oncology

## 2018-11-28 DIAGNOSIS — D61810 Antineoplastic chemotherapy induced pancytopenia: Secondary | ICD-10-CM | POA: Diagnosis not present

## 2018-11-28 DIAGNOSIS — I10 Essential (primary) hypertension: Secondary | ICD-10-CM | POA: Diagnosis not present

## 2018-11-28 DIAGNOSIS — Z5111 Encounter for antineoplastic chemotherapy: Secondary | ICD-10-CM | POA: Diagnosis not present

## 2018-11-28 DIAGNOSIS — R252 Cramp and spasm: Secondary | ICD-10-CM | POA: Diagnosis not present

## 2018-11-28 DIAGNOSIS — E119 Type 2 diabetes mellitus without complications: Secondary | ICD-10-CM | POA: Diagnosis not present

## 2018-11-28 DIAGNOSIS — C210 Malignant neoplasm of anus, unspecified: Secondary | ICD-10-CM | POA: Diagnosis not present

## 2018-11-28 NOTE — Progress Notes (Signed)
[facility]   [phone] [phone]   Clinic Follow up Note   Patient Care Team: Shirline Frees, MD as PCP - General (Family Medicine)  Date of Service:  12/01/2018  CHIEF COMPLAINT: F/u of anal cancer  SUMMARY OF ONCOLOGIC HISTORY:   Anal cancer ([place])   03/19/2018 Genetic Testing    Genetic testing performed through [facility]'s Common Hereditary Cancers Panel reported out on 03/10/2018 showed no pathogenic mutations. The Common Hereditary Cancer Panel offered by [facility] includes sequencing and/or deletion duplication testing of the following 47 genes: APC, ATM, AXIN2, BARD1, BMPR1A, BRCA1, BRCA2, BRIP1, CDH1, CDKN2A (p14ARF), CDKN2A (p16INK4a), CKD4, CHEK2, CTNNA1, DICER1, EPCAM (Deletion/duplication testing only), GREM1 (promoter region deletion/duplication testing only), KIT, MEN1, MLH1, MSH2, MSH3, MSH6, MUTYH, NBN, NF1, NHTL1, PALB2, PDGFRA, PMS2, POLD1, POLE, PTEN, RAD50, RAD51C, RAD51D, SDHB, SDHC, SDHD, SMAD4, SMARCA4. STK11, TP53, TSC1, TSC2, and VHL.  The following genes were evaluated for sequence changes only: SDHA and HOXB13 c.251G>A variant only.  A variant of uncertain significance (VUS) in a gene called PMS2 was also noted. c.1399G>A (p.Val467Ile)    08/14/2018 Imaging    MRI PELVIS W WO Contrast 08/14/18 IMPRESSION: 1. Centrally necrotic 1.8 cm enlarged lymph node versus nonspecific soft tissue mass in the left hemipelvis adjacent to the posterior pelvic sidewall. Further evaluation with biopsy and/or PET-CT is recommended.    09/17/2018 Pathology Results    Diagnosis 09/17/18 Lymph node, needle/core biopsy, left pelvic - SQUAMOUS CELL CARCINOMA. - SEE COMMENT. Microscopic Comment    10/09/2018 PET scan    PET 10/09/18  IMPRESSION: 1. 17 mm left pelvic sidewall lesion is markedly hypermetabolic compatible with known malignancy. 2. No additional definite hypermetabolic disease identified in the neck, chest, abdomen, or  pelvis. 3. Relatively diffuse FDG accumulation in the colon without focal increased uptake above background colonic levels in the region of the anus. 4. Focal FDG accumulation identified along the anterior left femoral neck and in the lumbosacral junction. No underlying bone lesions evident at these locations on CT images. Indeterminate finding although metastatic disease cannot be excluded.     10/13/2018 Initial Biopsy    Diagnosis 10/13/18 Anus, biopsy - SQUAMOUS CELL CARCINOMA. - SEE COMMENT.    10/14/2018 Initial Diagnosis    Anal cancer ([place])    10/14/2018 Cancer Staging    Staging form: Anus, AJCC 8th Edition - Clinical stage from 10/14/2018: Stage IIIA (cT2, cN1b, cM0) - Signed by Truitt Merle, MD on 10/14/2018    11/03/2018 -  Radiation Therapy    Concurrent chemoRt with Dr. Lisbeth Renshaw for 5-6 weeks starting 11/03/18. Plans to complete on 12/15/18    11/03/2018 -  Chemotherapy    Concurrent chemoRt with Mitomycin and 5-FU on week 1 and week 5 starting 11/03/18. Week 5 chemo on 12/01/18      CURRENT THERAPY:  Chemoradiation for 6 weeks with Mitomycin and 5-FU on week 1 and week 5starting 11/03/18. Plan to complete radiation on 12/15/18   INTERVAL HISTORY:  Angelica Wright is here for a follow up and week 5 treatment. She presents to the clinic today with her husband. She notes she is doing well overall. She notes still having buttocks pain managed by OTC pain meds. She notes her appetite is not as adequate. She takes Glucerna for this.     REVIEW OF SYSTEMS:   Constitutional: Denies fevers, chills or abnormal weight loss Eyes: Denies blurriness of vision Ears, nose, mouth, throat, and face: Denies mucositis or sore throat Respiratory:  Denies cough, dyspnea or wheezes Cardiovascular: Denies palpitation, chest discomfort or lower extremity swelling Gastrointestinal:  Denies nausea, heartburn or change in bowel habits (+) buttocks pain  Skin: Denies abnormal skin  rashes Lymphatics: Denies new lymphadenopathy or easy bruising Neurological:Denies numbness, tingling or new weaknesses Behavioral/Psych: Mood is stable, no new changes  All other systems were reviewed with the patient and are negative.  MEDICAL HISTORY:  Past Medical History:  Diagnosis Date  . Hyperparathyroidism ([place])    resolved after surgery  . Hypertension   . Osteoporosis    of the spine  . Rectocele   . Squamous cell cancer, anus ([facility])   . TMJ syndrome   . Type 2 Diabetes ([place]) 11/2013   controlled with diet/exer    SURGICAL HISTORY: Past Surgical History:  Procedure Laterality Date  . LYMPH NODE BIOPSY    . PARATHYROIDECTOMY  2000  . THERAPEUTIC ABORTION     x 2  . VULVA /PERINEUM BIOPSY  2012    I have reviewed the social history and family history with the patient and they are unchanged from previous note.  ALLERGIES:  is allergic to sulfa antibiotics.  MEDICATIONS:  Current Outpatient Medications  Medication Sig Dispense Refill  . aspirin 81 MG tablet Take 81 mg by mouth every evening.     Marland Kitchen atenolol (TENORMIN) 50 MG tablet Take 50 mg by mouth every evening.     . calcium carbonate (TUMS - DOSED IN MG ELEMENTAL CALCIUM) 500 MG chewable tablet Chew 1 tablet by mouth daily as needed for indigestion or heartburn.    . cholecalciferol (VITAMIN D3) 25 MCG (1000 UT) tablet Take 2,000 Units by mouth every evening.    . clobetasol ointment (TEMOVATE) 0.05 % Apply a pea sized amount topically BID x 1-2 weeks prn flare 30 g 1  . Coenzyme Q10 (CO Q 10) 100 MG CAPS Take 100 mg by mouth daily.     . hydrochlorothiazide (HYDRODIURIL) 12.5 MG tablet Take 12.5 mg by mouth daily.     Marland Kitchen ibuprofen (ADVIL,MOTRIN) 100 MG tablet Take 400-600 mg by mouth 2 (two) times daily as needed for pain.    . magic mouthwash w/lidocaine SOLN Take 5 mLs by mouth 3 (three) times daily. Swish and spit 5 mls TID as needed. 100 mL 1  . Magnesium 250 MG TABS Take 250 mg by mouth every evening.     . Multiple Vitamin (MULTIVITAMIN WITH MINERALS) TABS tablet Take 1 tablet by mouth daily.    . ondansetron (ZOFRAN) 8 MG tablet Take 1 tablet (8 mg total) by mouth 2 (two) times daily as needed (Nausea or vomiting). 30 tablet 1  . prochlorperazine (COMPAZINE) 10 MG tablet Take 1 tablet (10 mg total) by mouth every 6 (six) hours as needed (Nausea or vomiting). 30 tablet 1  . SUPER B COMPLEX/C PO Take 1 tablet by mouth daily.    . valACYclovir (VALTREX) 500 MG tablet Take one tab po BID for 3 days with flares. (Patient not taking: Reported on 11/24/2018) 30 tablet 1  . vitamin C (ASCORBIC ACID) 500 MG tablet Take 500 mg by mouth every evening.     No current facility-administered medications for this visit.    Facility-Administered Medications Ordered in Other Visits  Medication Dose Route Frequency Provider Last Rate Last Dose  . fluorouracil (ADRUCIL) 7,000 mg in sodium chloride 0.9 % 110 mL chemo infusion  1,000 mg/m2/day (Treatment Plan Recorded) Intravenous 4 days Truitt Merle, MD   7,000 mg  at 12/01/18 1045    PHYSICAL EXAMINATION: ECOG PERFORMANCE STATUS: 1 - Symptomatic but completely ambulatory  Vitals with BMI 12/01/2018  Height 5'1"  Weight 152 lbs 10oz  BMI 97.74  Systolic 142  Diastolic 64  Pulse 78  Respirations 18    GENERAL:alert, no distress and comfortable SKIN: skin color, texture, turgor are normal, no rashes or significant lesions EYES: normal, Conjunctiva are pink and non-injected, sclera clear OROPHARYNX:no exudate, no erythema and lips, buccal mucosa, and tongue normal  NECK: supple, thyroid normal size, non-tender, without nodularity LYMPH:  no palpable lymphadenopathy in the cervical, axillary or inguinal LUNGS: clear to auscultation and percussion with normal breathing effort HEART: regular rate & rhythm and no murmurs and no lower extremity edema ABDOMEN:abdomen soft, non-tender and normal bowel sounds Musculoskeletal:no cyanosis of digits and no clubbing   NEURO: alert & oriented x 3 with fluent speech, no focal motor/sensory deficits  LABORATORY DATA:  I have reviewed the data as listed CBC Latest Ref Rng & Units 12/01/2018 11/24/2018 11/20/2018  WBC 4.0 - 10.5 K/uL 4.0 3.7(L) 2.1(L)  Hemoglobin 12.0 - 15.0 g/dL 12.1 12.0 11.7(L)  Hematocrit 36.0 - 46.0 % 34.9(L) 34.9(L) 33.8(L)  Platelets 150 - 400 K/uL 296 245 106(L)     CMP Latest Ref Rng & Units 12/01/2018 11/24/2018 11/20/2018  Glucose 70 - 99 mg/dL 116(H) 106(H) 113(H)  BUN 8 - 23 mg/dL _0 Creatinine 0.44 - 1.00 mg/dL 0.79 0.76 0.75  Sodium 135 - 145 mmol/L 144 144 143  Potassium 3.5 - 5.1 mmol/L 3.3(L) 3.5 3.5  Chloride 98 - 111 mmol/L 108 108 108  CO2 22 - 32 mmol/L _1 Calcium 8.9 - 10.3 mg/dL 9.4 9.0 9.1  Total Protein 6.5 - 8.1 g/dL 6.9 7.0 6.9  Total Bilirubin 0.3 - 1.2 mg/dL 0.6 0.5 0.4  Alkaline Phos 38 - 126 U/L 61 72 67  AST 15 - 41 U/L _2 ALT 0 - 44 U/L _3 RADIOGRAPHIC STUDIES: I have personally reviewed the radiological images as listed and agreed with the findings in the report. Ir Picc Placement Right >5 Yrs Inc Img Guide  Result Date: 12/01/2018 INDICATION: Patient with history of anal cancer and poor venous access; central venous access requested for chemotherapy. EXAM: ULTRASOUND AND FLUOROSCOPIC GUIDED RIGHT UPPER EXTREMITY PICC LINE INSERTION MEDICATIONS: 1% lidocaine to skin and subcutaneous tissue ANESTHESIA/SEDATION: None FLUOROSCOPY TIME:  Fluoroscopy Time: 42 seconds (3 mGy). COMPLICATIONS: None immediate. TECHNIQUE: The procedure, risks, benefits, and alternatives were explained to the patient and informed written consent was obtained. A timeout was performed prior to the initiation of the procedure. The right upper extremity was prepped with chlorhexidine in a sterile fashion, and a sterile drape was applied covering the operative field. Maximum barrier sterile technique with sterile gowns and gloves were used for the  procedure. A timeout was performed prior to the initiation of the procedure. Local anesthesia was provided with 1% lidocaine. Under direct ultrasound guidance, the right basilic vein was accessed with a micropuncture kit after the overlying soft tissues were anesthetized with 1% lidocaine. An ultrasound image was saved for documentation purposes. A guidewire was advanced to the level of the superior caval-atrial junction for measurement purposes and the PICC line was cut to length. A peel-away sheath was placed and a 33 cm, 5 [place], single lumen was inserted to level of the superior caval-atrial junction. A post procedure spot fluoroscopic was  obtained. The catheter easily aspirated and flushed and was sutured in place. A dressing was placed. The patient tolerated the procedure well without immediate post procedural complication. FINDINGS: After catheter placement, the tip lies within the superior caval atrial junction the catheter aspirates and flushes normally and is ready for immediate use. IMPRESSION: Successful ultrasound and fluoroscopic guided placement of a right basilic vein approach, 33 cm, 5 French, single lumen PICC with tip at the superior caval-atrial junction. The PICC line is ready for immediate use. Read by: Rowe Robert, PA-C Electronically Signed   By: Sandi Mariscal M.D.   On: 12/01/2018 09:35     ASSESSMENT & PLAN:  RAKEYA Wright is a 63 y.o. female with   1.Anal Cancer,withpositiveleft pelvic lymph node, cT2N1bM0, stage IIIA -She was recently diagnosed in 09/2018. -She has locally advanced squamous cell carcinoma of the anus(in anal canal and low rectum)which has spread to pelviclymph node. -Her 03/2018 genetic testing was negative except for VUS of gene PMS2. -Shestartedstandard treatment isconcurrentChemoradiation for 6 weeks with Mitomycin and 5-FU on week 1 and week 5on 10/07/00/9. Will have PICC Line on week 1 and 5. -She is tolerating treatment moderately well with  complete hair loss, fatigue, mild diarrhea and moderate buttocks pain from radiation.  -If her buttock pain worsens, I will call in pain medication. She is taking Tylenol as needed for now. Will monitor  -Labs revewied today, CBC much improved, K at 3.3., Glucose at 116. Overall adequate for Mitomycin and 5-FU treatment today.  -Her nadir platelet was 56K, will reduce mitomycin dose to 7.5 mg/m, per protocol. -I recommend she increase potassium in her diet. I will call in KCL also  -F/u in 1 week  2. HTN and DM -She is on atenolol and HCTZ,not on diabetic medication -Managed by her PCP -Will monitor closely, during chemotherapy -BP normal today   3. Mucositis, dysphagia, secondary to chemo -She has magic mouth wash, continue to use for 2 minutes before each meal -improved. We discussed she will have recurrent symptoms after chemo this week   4. Diarrhea, Hemorrhoids -She has hemorrhoids exacerbated by diarrhea.  -ContinueSITS bath and OTC cream  -Diarrhea controlled, stable. No need for imodium currently, but I encouraged her to use it if very loose stool in indicated.    5. Pancytopenia -Secondary to chemotherapy -We previously discussed risk of infection, bleeding, and need for blood transfusion -Currently resolved, Wbc at 4, Hg at 12.1 and Plt at 296K. (12/01/18)   PLAN: -Labs reviewed and adequate to proceed with Mitomycin and 5-Fu today with dose reduction on mitomycin, she will complete chemo and have PICC line removed this Friday 1/31  -lab and f/u in 1 week     No problem-specific Assessment & Plan notes found for this encounter.   No orders of the defined types were placed in this encounter.  All questions were answered. The patient knows to call the clinic with any problems, questions or concerns. No barriers to learning was detected. I spent 20 minutes counseling the patient face to face. The total time spent in the appointment was 25 minutes and more  than 50% was on counseling and review of test results     Truitt Merle, MD 12/01/2018   I, Joslyn Devon, am acting as scribe for Truitt Merle, MD.   I have reviewed the above documentation for accuracy and completeness, and I agree with the above.

## 2018-12-01 ENCOUNTER — Other Ambulatory Visit: Payer: Self-pay | Admitting: Hematology

## 2018-12-01 ENCOUNTER — Inpatient Hospital Stay: Payer: 59

## 2018-12-01 ENCOUNTER — Ambulatory Visit (HOSPITAL_COMMUNITY)
Admission: RE | Admit: 2018-12-01 | Discharge: 2018-12-01 | Disposition: A | Discharge status: Home / Self Care | Payer: 59 | Source: Ambulatory Visit | Attending: Hematology | Admitting: Hematology

## 2018-12-01 ENCOUNTER — Ambulatory Visit
Admission: RE | Admit: 2018-12-01 | Discharge: 2018-12-01 | Disposition: A | Discharge status: Home / Self Care | Payer: 59 | Source: Ambulatory Visit | Attending: Radiation Oncology | Admitting: Radiation Oncology

## 2018-12-01 ENCOUNTER — Inpatient Hospital Stay: Payer: 59 | Admitting: Hematology

## 2018-12-01 ENCOUNTER — Encounter: Payer: Self-pay | Admitting: Hematology

## 2018-12-01 VITALS — BP 128/64 | HR 78 | Temp 98.2°F | Resp 18

## 2018-12-01 DIAGNOSIS — C210 Malignant neoplasm of anus, unspecified: Secondary | ICD-10-CM | POA: Diagnosis present

## 2018-12-01 DIAGNOSIS — K1231 Oral mucositis (ulcerative) due to antineoplastic therapy: Secondary | ICD-10-CM

## 2018-12-01 DIAGNOSIS — R252 Cramp and spasm: Secondary | ICD-10-CM | POA: Diagnosis not present

## 2018-12-01 DIAGNOSIS — E119 Type 2 diabetes mellitus without complications: Secondary | ICD-10-CM

## 2018-12-01 DIAGNOSIS — Z5111 Encounter for antineoplastic chemotherapy: Secondary | ICD-10-CM | POA: Diagnosis not present

## 2018-12-01 DIAGNOSIS — E1169 Type 2 diabetes mellitus with other specified complication: Secondary | ICD-10-CM

## 2018-12-01 DIAGNOSIS — I10 Essential (primary) hypertension: Secondary | ICD-10-CM | POA: Diagnosis not present

## 2018-12-01 DIAGNOSIS — D61810 Antineoplastic chemotherapy induced pancytopenia: Secondary | ICD-10-CM

## 2018-12-01 LAB — CBC WITH DIFFERENTIAL (CANCER CENTER ONLY)
Abs Immature Granulocytes: 0.01 10*3/uL (ref 0.00–0.07)
Basophils Absolute: 0 10*3/uL (ref 0.0–0.1)
Basophils Relative: 1 %
EOS ABS: 0.1 10*3/uL (ref 0.0–0.5)
Eosinophils Relative: 1 %
HEMATOCRIT: 34.9 % — AB (ref 36.0–46.0)
Hemoglobin: 12.1 g/dL (ref 12.0–15.0)
IMMATURE GRANULOCYTES: 0 %
LYMPHS ABS: 0.3 10*3/uL — AB (ref 0.7–4.0)
Lymphocytes Relative: 7 %
MCH: 32.5 pg (ref 26.0–34.0)
MCHC: 34.7 g/dL (ref 30.0–36.0)
MCV: 93.8 fL (ref 80.0–100.0)
Monocytes Absolute: 0.5 10*3/uL (ref 0.1–1.0)
Monocytes Relative: 11 %
Neutro Abs: 3.2 10*3/uL (ref 1.7–7.7)
Neutrophils Relative %: 80 %
Platelet Count: 296 10*3/uL (ref 150–400)
RBC: 3.72 MIL/uL — ABNORMAL LOW (ref 3.87–5.11)
RDW: 14.4 % (ref 11.5–15.5)
WBC Count: 4 10*3/uL (ref 4.0–10.5)
nRBC: 0 % (ref 0.0–0.2)

## 2018-12-01 LAB — CMP (CANCER CENTER ONLY)
ALK PHOS: 61 U/L (ref 38–126)
ALT: 24 U/L (ref 0–44)
AST: 20 U/L (ref 15–41)
Albumin: 3.7 g/dL (ref 3.5–5.0)
Anion gap: 11 (ref 5–15)
BUN: 8 mg/dL (ref 8–23)
CO2: 25 mmol/L (ref 22–32)
Calcium: 9.4 mg/dL (ref 8.9–10.3)
Chloride: 108 mmol/L (ref 98–111)
Creatinine: 0.79 mg/dL (ref 0.44–1.00)
GFR, Est AFR Am: >60 mL/min (ref >60)
GFR, Estimated: >60 mL/min (ref >60)
Glucose, Bld: 116 mg/dL — ABNORMAL HIGH (ref 70–99)
Potassium: 3.3 mmol/L — ABNORMAL LOW (ref 3.5–5.1)
Sodium: 144 mmol/L (ref 135–145)
Total Bilirubin: 0.6 mg/dL (ref 0.3–1.2)
Total Protein: 6.9 g/dL (ref 6.5–8.1)

## 2018-12-01 MED ORDER — PROCHLORPERAZINE MALEATE 10 MG PO TABS
ORAL_TABLET | ORAL | Status: AC
  Filled 2018-12-01: qty 1

## 2018-12-01 MED ORDER — SODIUM CHLORIDE 0.9 % IV SOLN
Freq: Once | INTRAVENOUS | Status: AC
  Administered 2018-12-01: 10:00:00 via INTRAVENOUS
  Filled 2018-12-01: qty 250

## 2018-12-01 MED ORDER — LIDOCAINE HCL 1 % IJ SOLN
INTRAMUSCULAR | Status: AC
  Administered 2018-12-01: 5 mL
  Filled 2018-12-01: qty 20

## 2018-12-01 MED ORDER — MITOMYCIN CHEMO IV INJECTION 20 MG: 18.0000 mg | Freq: Once | INTRAVENOUS | Status: DC

## 2018-12-01 MED ORDER — SODIUM CHLORIDE 0.9 % IV SOLN
1000.0000 mg/m2/d | INTRAVENOUS | Status: DC
  Administered 2018-12-01: 7000 mg via INTRAVENOUS
  Filled 2018-12-01: qty 140

## 2018-12-01 MED ORDER — PROCHLORPERAZINE MALEATE 10 MG PO TABS
10.0000 mg | ORAL_TABLET | Freq: Once | ORAL | Status: AC
  Administered 2018-12-01: 10 mg via ORAL

## 2018-12-01 MED ORDER — MITOMYCIN CHEMO IV INJECTION 20 MG
7.5000 mg/m2 | Freq: Once | INTRAVENOUS | Status: AC
  Administered 2018-12-01: 13 mg via INTRAVENOUS
  Filled 2018-12-01: qty 26

## 2018-12-01 NOTE — Progress Notes (Signed)
Spoke with MD. Decrease Mitomycin to 7.5mg /m2 due to nadir CBC results. Order decreased as discussed.  Hardie Pulley, PharmD, BCPS, BCOP

## 2018-12-01 NOTE — Patient Instructions (Signed)
[facility] Discharge Instructions for Patients Receiving Chemotherapy  Today you received the following chemotherapy agents Mitomycin and 5FU  To help prevent nausea and vomiting after your treatment, we encourage you to take your nausea medication as directed   If you develop nausea and vomiting that is not controlled by your nausea medication, call the clinic.   BELOW ARE SYMPTOMS THAT SHOULD BE REPORTED IMMEDIATELY:  *FEVER GREATER THAN 100.5 F  *CHILLS WITH OR WITHOUT FEVER  NAUSEA AND VOMITING THAT IS NOT CONTROLLED WITH YOUR NAUSEA MEDICATION  *UNUSUAL SHORTNESS OF BREATH  *UNUSUAL BRUISING OR BLEEDING  TENDERNESS IN MOUTH AND THROAT WITH OR WITHOUT PRESENCE OF ULCERS  *URINARY PROBLEMS  *BOWEL PROBLEMS  UNUSUAL RASH Items with * indicate a potential emergency and should be followed up as soon as possible.  Feel free to call the clinic should you have any questions or concerns. The clinic phone number is [phone].  Please show the [place] at check-in to the Emergency Department and triage nurse.

## 2018-12-01 NOTE — Procedures (Signed)
Ultrasound/fluoroscopic guided placement performed of a 33 cm right single lumen basilic vein PICC.  Tip at SVC/right atrial junction.  No immediate complications.  Okay to use. EBL none.

## 2018-12-02 ENCOUNTER — Ambulatory Visit
Admission: RE | Admit: 2018-12-02 | Discharge: 2018-12-02 | Disposition: A | Discharge status: Home / Self Care | Payer: 59 | Source: Ambulatory Visit | Attending: Radiation Oncology | Admitting: Radiation Oncology

## 2018-12-02 ENCOUNTER — Telehealth: Payer: Self-pay | Admitting: Hematology

## 2018-12-02 DIAGNOSIS — C210 Malignant neoplasm of anus, unspecified: Secondary | ICD-10-CM | POA: Diagnosis not present

## 2018-12-02 DIAGNOSIS — R252 Cramp and spasm: Secondary | ICD-10-CM | POA: Diagnosis not present

## 2018-12-02 NOTE — Telephone Encounter (Signed)
No los per 01/27. °

## 2018-12-03 ENCOUNTER — Other Ambulatory Visit: Payer: Self-pay | Admitting: Hematology

## 2018-12-03 ENCOUNTER — Ambulatory Visit
Admission: RE | Admit: 2018-12-03 | Discharge: 2018-12-03 | Disposition: A | Discharge status: Home / Self Care | Payer: 59 | Source: Ambulatory Visit | Attending: Radiation Oncology | Admitting: Radiation Oncology

## 2018-12-03 ENCOUNTER — Other Ambulatory Visit: Payer: Self-pay

## 2018-12-03 DIAGNOSIS — R252 Cramp and spasm: Secondary | ICD-10-CM | POA: Diagnosis not present

## 2018-12-03 DIAGNOSIS — C210 Malignant neoplasm of anus, unspecified: Secondary | ICD-10-CM | POA: Diagnosis not present

## 2018-12-03 MED ORDER — POTASSIUM CHLORIDE CRYS ER 20 MEQ PO TBCR: 20.0000 meq | EXTENDED_RELEASE_TABLET | Freq: Every day | ORAL | 0 refills | Status: DC

## 2018-12-03 NOTE — Progress Notes (Unsigned)
P 

## 2018-12-04 ENCOUNTER — Ambulatory Visit
Admission: RE | Admit: 2018-12-04 | Discharge: 2018-12-04 | Disposition: A | Discharge status: Home / Self Care | Payer: 59 | Source: Ambulatory Visit | Attending: Radiation Oncology | Admitting: Radiation Oncology

## 2018-12-04 DIAGNOSIS — R252 Cramp and spasm: Secondary | ICD-10-CM | POA: Diagnosis not present

## 2018-12-04 DIAGNOSIS — C210 Malignant neoplasm of anus, unspecified: Secondary | ICD-10-CM | POA: Diagnosis not present

## 2018-12-05 ENCOUNTER — Ambulatory Visit
Admission: RE | Admit: 2018-12-05 | Discharge: 2018-12-05 | Disposition: A | Discharge status: Home / Self Care | Payer: 59 | Source: Ambulatory Visit | Attending: Radiation Oncology | Admitting: Radiation Oncology

## 2018-12-05 ENCOUNTER — Inpatient Hospital Stay: Payer: 59

## 2018-12-05 VITALS — BP 133/77 | HR 82 | Temp 98.4°F | Resp 18

## 2018-12-05 DIAGNOSIS — C210 Malignant neoplasm of anus, unspecified: Secondary | ICD-10-CM

## 2018-12-05 DIAGNOSIS — R252 Cramp and spasm: Secondary | ICD-10-CM | POA: Diagnosis not present

## 2018-12-05 MED ORDER — HEPARIN SOD (PORK) LOCK FLUSH 100 UNIT/ML IV SOLN
250.0000 [IU] | Freq: Once | INTRAVENOUS | Status: DC | PRN
  Filled 2018-12-05: qty 5

## 2018-12-05 MED ORDER — SODIUM CHLORIDE 0.9% FLUSH
10.0000 mL | INTRAVENOUS | Status: DC | PRN
  Administered 2018-12-05: 10 mL
  Filled 2018-12-05: qty 10

## 2018-12-05 NOTE — Progress Notes (Signed)
picc line d/ced per completion of need and per order.  Pt tolerated procedure well.  Site clean dry with no noted bleeding with Vaseline gauze and pressure dressing applied.

## 2018-12-05 NOTE — Progress Notes (Signed)
Patient arrived to clinic for 5 FU pump d'c and still had 40 cc left in  the bag. Per pharmacy and Dr. Burr Medico, the nurse is to increase the rate of the pump to 3.5 mls/hr and have the patient return at 4pm. Pump was programed and restarted. Patient verbalizes understanding.    PICC line was removed and patient waited 30 minutes after. Vitals are stable and there is no bleeding at the site.

## 2018-12-05 NOTE — Progress Notes (Signed)
[facility]   [phone] [phone]   Clinic Follow up Note   Patient Care Team: Shirline Frees, MD as PCP - General (Family Medicine)  Date of Service:  12/08/2018  CHIEF COMPLAINT:  F/u of anal cancer  SUMMARY OF ONCOLOGIC HISTORY:   Anal cancer ([place])   03/19/2018 Genetic Testing    Genetic testing performed through [facility]'s Common Hereditary Cancers Panel reported out on 03/10/2018 showed no pathogenic mutations. The Common Hereditary Cancer Panel offered by [facility] includes sequencing and/or deletion duplication testing of the following 47 genes: APC, ATM, AXIN2, BARD1, BMPR1A, BRCA1, BRCA2, BRIP1, CDH1, CDKN2A (p14ARF), CDKN2A (p16INK4a), CKD4, CHEK2, CTNNA1, DICER1, EPCAM (Deletion/duplication testing only), GREM1 (promoter region deletion/duplication testing only), KIT, MEN1, MLH1, MSH2, MSH3, MSH6, MUTYH, NBN, NF1, NHTL1, PALB2, PDGFRA, PMS2, POLD1, POLE, PTEN, RAD50, RAD51C, RAD51D, SDHB, SDHC, SDHD, SMAD4, SMARCA4. STK11, TP53, TSC1, TSC2, and VHL.  The following genes were evaluated for sequence changes only: SDHA and HOXB13 c.251G>A variant only.  A variant of uncertain significance (VUS) in a gene called PMS2 was also noted. c.1399G>A (p.Val467Ile)    08/14/2018 Imaging    MRI PELVIS W WO Contrast 08/14/18 IMPRESSION: 1. Centrally necrotic 1.8 cm enlarged lymph node versus nonspecific soft tissue mass in the left hemipelvis adjacent to the posterior pelvic sidewall. Further evaluation with biopsy and/or PET-CT is recommended.    09/17/2018 Pathology Results    Diagnosis 09/17/18 Lymph node, needle/core biopsy, left pelvic - SQUAMOUS CELL CARCINOMA. - SEE COMMENT. Microscopic Comment    10/09/2018 PET scan    PET 10/09/18  IMPRESSION: 1. 17 mm left pelvic sidewall lesion is markedly hypermetabolic compatible with known malignancy. 2. No additional definite hypermetabolic disease identified in the neck, chest, abdomen, or  pelvis. 3. Relatively diffuse FDG accumulation in the colon without focal increased uptake above background colonic levels in the region of the anus. 4. Focal FDG accumulation identified along the anterior left femoral neck and in the lumbosacral junction. No underlying bone lesions evident at these locations on CT images. Indeterminate finding although metastatic disease cannot be excluded.     10/13/2018 Initial Biopsy    Diagnosis 10/13/18 Anus, biopsy - SQUAMOUS CELL CARCINOMA. - SEE COMMENT.    10/14/2018 Initial Diagnosis    Anal cancer ([place])    10/14/2018 Cancer Staging    Staging form: Anus, AJCC 8th Edition - Clinical stage from 10/14/2018: Stage IIIA (cT2, cN1b, cM0) - Signed by Truitt Merle, MD on 10/14/2018    11/03/2018 -  Radiation Therapy    Concurrent chemoRt with Dr. Lisbeth Renshaw for 5-6 weeks starting 11/03/18. Plans to complete on 12/15/18    11/03/2018 -  Chemotherapy    Concurrent chemoRt with Mitomycin and 5-FU on week 1 and week 5 starting 11/03/18. Week 5 chemo on 12/01/18      CURRENT THERAPY:  Chemoradiation for 6 weeks with Mitomycin and 5-FU on week 1 and week 5starting 11/03/18. Plan to complete radiation on 12/15/18   INTERVAL HISTORY:  Angelica Wright is here for a follow up and treatment. She presents the clinic today with her husband. She notes she is losing weight due to her significant diarrhea. She has been having 8 BM daily. She has not been taking Imodium and does not have Lomotil. She also notes abdominal gas with diarrhea. She notes she has not been eating adequately. She has been trying to drink, but has had only 3 cups a day.  She notes having skin break down af buttocks  from radiation. She notes stable rectal pain. She also notes pain of labial skin with urination. She notes blood on tissue likely from her hemorrhoids according to her.     REVIEW OF SYSTEMS:   Constitutional: Denies fevers, chills (+) Low appetite, weight loss  Eyes: Denies  blurriness of vision Ears, nose, mouth, throat, and face: Denies mucositis or sore throat Respiratory: Denies cough, dyspnea or wheezes Cardiovascular: Denies palpitation, chest discomfort or lower extremity swelling Gastrointestinal:  Denies nausea, heartburn (+) Significant diarrhea, gas  Skin: Denies abnormal skin rashes (+) Sky darker and dry of hands (+) Skin breakdown of buttocks and inflammation of labia Lymphatics: Denies new lymphadenopathy or easy bruising Neurological:Denies numbness, tingling or new weaknesses Behavioral/Psych: Mood is stable, no new changes  All other systems were reviewed with the patient and are negative.  MEDICAL HISTORY:  Past Medical History:  Diagnosis Date  . Hyperparathyroidism ([facility])    resolved after surgery  . Hypertension   . Osteoporosis    of the spine  . Rectocele   . Squamous cell cancer, anus ([facility])   . TMJ syndrome   . Type 2 Diabetes ([place]) 11/2013   controlled with diet/exer    SURGICAL HISTORY: Past Surgical History:  Procedure Laterality Date  . LYMPH NODE BIOPSY    . PARATHYROIDECTOMY  2000  . THERAPEUTIC ABORTION     x 2  . VULVA /PERINEUM BIOPSY  2012    I have reviewed the social history and family history with the patient and they are unchanged from previous note.  ALLERGIES:  is allergic to sulfa antibiotics.  MEDICATIONS:  Current Outpatient Medications  Medication Sig Dispense Refill  . aspirin 81 MG tablet Take 81 mg by mouth every evening.     Marland Kitchen atenolol (TENORMIN) 50 MG tablet Take 50 mg by mouth every evening.     . calcium carbonate (TUMS - DOSED IN MG ELEMENTAL CALCIUM) 500 MG chewable tablet Chew 1 tablet by mouth daily as needed for indigestion or heartburn.    . cholecalciferol (VITAMIN D3) 25 MCG (1000 UT) tablet Take 2,000 Units by mouth every evening.    . clobetasol ointment (TEMOVATE) 0.05 % Apply a pea sized amount topically BID x 1-2 weeks prn flare 30 g 1  . Coenzyme Q10 (CO Q 10) 100 MG CAPS  Take 100 mg by mouth daily.     . hydrochlorothiazide (HYDRODIURIL) 12.5 MG tablet Take 12.5 mg by mouth daily.     Marland Kitchen ibuprofen (ADVIL,MOTRIN) 100 MG tablet Take 400-600 mg by mouth 2 (two) times daily as needed for pain.    . magic mouthwash w/lidocaine SOLN Take 5 mLs by mouth 3 (three) times daily. Swish and spit 5 mls TID as needed. 100 mL 1  . Magnesium 250 MG TABS Take 250 mg by mouth every evening.    . Multiple Vitamin (MULTIVITAMIN WITH MINERALS) TABS tablet Take 1 tablet by mouth daily.    . ondansetron (ZOFRAN) 8 MG tablet Take 1 tablet (8 mg total) by mouth 2 (two) times daily as needed (Nausea or vomiting). 30 tablet 1  . potassium chloride SA (K-DUR,KLOR-CON) 20 MEQ tablet Take 1 tablet (20 mEq total) by mouth daily. 30 tablet 0  . prochlorperazine (COMPAZINE) 10 MG tablet Take 1 tablet (10 mg total) by mouth every 6 (six) hours as needed (Nausea or vomiting). 30 tablet 1  . SUPER B COMPLEX/C PO Take 1 tablet by mouth daily.    . valACYclovir (VALTREX) 500 MG  tablet Take one tab po BID for 3 days with flares. 30 tablet 1  . vitamin C (ASCORBIC ACID) 500 MG tablet Take 500 mg by mouth every evening.     No current facility-administered medications for this visit.     PHYSICAL EXAMINATION: ECOG PERFORMANCE STATUS: 1 - Symptomatic but completely ambulatory  Vitals:   12/08/18 0855  BP: 129/73  Pulse: 93  Resp: 18  Temp: 97.8 F (36.6 C)  SpO2: 99%   Filed Weights   12/08/18 0855  Weight: 143 lb 1.6 oz (64.9 kg)    GENERAL:alert, no distress and comfortable SKIN: skin color, texture, turgor are normal, no rashes or significant lesions EYES: normal, Conjunctiva are pink and non-injected, sclera clear OROPHARYNX:no exudate, no erythema and lips, buccal mucosa, and tongue normal  NECK: supple, thyroid normal size, non-tender, without nodularity LYMPH:  no palpable lymphadenopathy in the cervical, axillary or inguinal LUNGS: clear to auscultation and percussion with  normal breathing effort HEART: regular rate & rhythm and no murmurs and no lower extremity edema ABDOMEN:abdomen soft, non-tender and normal bowel sounds Musculoskeletal:no cyanosis of digits and no clubbing  NEURO: alert & oriented x 3 with fluent speech, no focal motor/sensory deficits  LABORATORY DATA:  I have reviewed the data as listed CBC Latest Ref Rng & Units 12/08/2018 12/01/2018 11/24/2018  WBC 4.0 - 10.5 K/uL 2.6(L) 4.0 3.7(L)  Hemoglobin 12.0 - 15.0 g/dL 13.0 12.1 12.0  Hematocrit 36.0 - 46.0 % 38.7 34.9(L) 34.9(L)  Platelets 150 - 400 K/uL 185 296 245     CMP Latest Ref Rng & Units 12/08/2018 12/01/2018 11/24/2018  Glucose 70 - 99 mg/dL 108(H) 116(H) 106(H)  BUN 8 - 23 mg/dL _0 Creatinine 0.44 - 1.00 mg/dL 0.84 0.79 0.76  Sodium 135 - 145 mmol/L 142 144 144  Potassium 3.5 - 5.1 mmol/L 4.1 3.3(L) 3.5  Chloride 98 - 111 mmol/L 105 108 108  CO2 22 - 32 mmol/L _1 Calcium 8.9 - 10.3 mg/dL 9.8 9.4 9.0  Total Protein 6.5 - 8.1 g/dL 7.5 6.9 7.0  Total Bilirubin 0.3 - 1.2 mg/dL 0.8 0.6 0.5  Alkaline Phos 38 - 126 U/L 62 61 72  AST 15 - 41 U/L _2 ALT 0 - 44 U/L _3 RADIOGRAPHIC STUDIES: I have personally reviewed the radiological images as listed and agreed with the findings in the report. No results found.   ASSESSMENT & PLAN:  Angelica Wright is a 63 y.o. female with   1.Anal Cancer,withpositiveleft pelvic lymph node, cT2N1bM0, stage IIIA -She was recently diagnosed in 09/2018. -She has locally advanced squamous cell carcinoma of the anus(in anal canal and low rectum)which has spread to pelviclymph node. -Her 03/2018 genetic testing was negative except for VUS of gene PMS2. -Shestartedstandard treatment isconcurrentChemoradiation for 6 weeks with Mitomycin and 5-FU on week 1 and week 5on 10/07/00/9. Will have PICC Line on week 1 and 5. -She is tolerating treatment moderately well with pancytopenia, complete hair loss,fatigue, mild  diarrhea and moderate buttocks pain from radiation.Chemo dose was reduced accordingly.  -If her buttock pain gets worse I will call in pain medication. She is taking Tylenol as needed for now, manageble. Will monitor  -Labs reviewed, CBC and CMP are WNL except WBC at 2.6, Glucose at 108.  -Plan to complete radiation on 2/10 -Plan for PET scan in 3 months to evaluate her response to chemoRT.  -F/u in 1  week   2. HTN and DM -She is on atenolol and HCTZ,not on diabetic medication -Managed by her PCP -Will monitor closely, during chemotherapy -BPnormal today, BG at 108 (12/08/18)  3. Mucositis, dysphagia, rectal pain secondary to chemoand radiation  -She has magic mouth wash, continue to use for 2 minutes before each meal -She knows to call us if her rectal pain gets worse, and she may need prescription pain medication. -Stable.   4. Diarrhea, Hemorrhoids -She has hemorrhoids exacerbated by diarrhea.  -ContinueSITS bath and OTC cream  -Diarrhea uncontrolled. I strongly encouraged her to take Imodium and I will prescribe her lomotil today (12/08/18).  -Pt notes blood on tissue being from hemorrhoids.  -On KLOR 20 mEq daily. Her K is normal today. If remains normal next week, She can stop oral potassium.   5. Pancytopenia -Secondary to chemotherapy -We previously discussed risk of infection, bleeding, and need for blood transfusion -WBC at 2.6, Hg and PLT normal today (12/08/18), we discussed at her blood counts will continue to drop this week, and likely improve in 1-2 weeks.  Neutropenic precaution reviewed again.   PLAN: -Lab and f/u in 1 and 3 weeks  -she is finishing RT in one week    No problem-specific Assessment & Plan notes found for this encounter.   No orders of the defined types were placed in this encounter.  All questions were answered. The patient knows to call the clinic with any problems, questions or concerns. No barriers to learning was detected. I  spent 15 minutes counseling the patient face to face. The total time spent in the appointment was 20 minutes and more than 50% was on counseling and review of test results     Truitt Merle, MD 12/08/2018   I, Joslyn Devon, am acting as scribe for Truitt Merle, MD.   I have reviewed the above documentation for accuracy and completeness, and I agree with the above.

## 2018-12-05 NOTE — Patient Instructions (Signed)
PICC Removal, Adult, Care After This sheet gives you information about how to care for yourself after your procedure. Your health care provider may also give you more specific instructions. If you have problems or questions, contact your health care provider. What can I expect after the procedure? After your procedure, it is common to have:  Tenderness or soreness.  Redness, swelling, or a scab where the PICC was removed (exit site). Follow these instructions at home: For the first 24 hours after the procedure   Keep the bandage (dressing) on the exit site clean and dry. Do not remove the dressing until your health care provider tells you to do so.  Check your arm often for signs and symptoms of an infection. Check for: ? A red streak that spreads away from the dressing. ? Blood or fluid that you can see on the dressing. ? More redness or swelling.  Do not lift anything heavy or do activities that require great effort until your health care provider says it is okay. You should avoid: ? Lifting weights. ? Yard work. ? Any physical activity with repetitive arm movement.  Watch closely for any signs of an air bubble in the vein (air embolism). This is a rare but serious complication. If you have signs of air embolism, call 911 immediately and lie down on your left side to keep the air from moving into the lungs. Signs of an air embolism include: ? Difficulty breathing. ? Chest pain. ? Coughing or wheezing. ? Skin that is pale, blue, cold, or clammy. ? Rapid pulse. ? Rapid breathing. ? Fainting. After 24 Hours have passed:  Remove your dressing as told by your health care provider. Make sure you wash your hands with soap and water before and after you change the dressing. If soap and water are not available, use hand sanitizer.  Return to your normal activities as told by your health care provider.  A small scab may develop over the exit site. Do not pick at the scab.  When  bathing or showering, gently wash the exit site with soap and water. Pat it dry.  Watch for signs of infection, such as: ? Fever or chills. ? Swollen glands under the arm. ? More redness, swelling, or soreness in the arm. ? Blood, fluid, or pus coming from the exit site. ? Warmth or a bad smell at the exit site. ? A red streak spreading away from the exit site. General instructions  Take over-the-counter and prescription medicines only as told by your health care provider. Do not take any new medicines without checking with your health care provider first.  If you were prescribed an antibiotic medicine, apply or take it as told by your health care provider. Do not stop using the antibiotic even if your condition improves.  Keep all follow-up visits as told by your health care provider. This is important. Contact a health care provider if:  You have a fever or chills.  You have soreness, redness, or swelling on your exit site, and it gets worse.  You have swollen glands under your arm.  You have any of the following symptoms at your exit site: ? Blood, fluid, or pus. ? Unusual warmth. ? A bad smell. ? A red streak spreading away from the exit site. Get help right away if:  You have numbness or tingling in your fingers, hand, or arm.  Your arm looks blue and feels cold.  You have signs of an air embolism, such   A bad smell.  ? A red streak spreading away from the exit site.  Get help right away if:  · You have numbness or tingling in your fingers, hand, or arm.  · Your arm looks blue and feels cold.  · You have signs of an air embolism, such as:  ? Difficulty breathing.  ? Chest pain.  ? Coughing or wheezing.  ? Skin that is pale, blue, cold, or clammy.  ? Rapid pulse.  ? Rapid breathing.  ? Fainting.  These symptoms may represent a serious problem that is an emergency. Do not wait to see if the symptoms will go away. Get medical help right away. Call your local emergency services (911 in the [place] Do not drive yourself to the hospital.  Summary  · After your procedure, it is common to have tenderness or soreness, redness, swelling, or a scab at the exit site.  · Keep the dressing over the exit site clean and dry.  Do not remove the dressing until your health care provider tells you to do so.  · Do not lift anything heavy or do activities that require great effort until your health care provider says it is okay.  · Watch closely for any signs of an air embolism. If you have signs of air embolism, call 911 immediately and lie down on your left side.  This information is not intended to replace advice given to you by your health care provider. Make sure you discuss any questions you have with your health care provider.  Document Released: 10/27/2013 Document Revised: 12/18/2016 Document Reviewed: 12/18/2016  Elsevier Interactive Patient Education © 2019 [employer]

## 2018-12-08 ENCOUNTER — Inpatient Hospital Stay: Payer: 59

## 2018-12-08 ENCOUNTER — Inpatient Hospital Stay (HOSPITAL_BASED_OUTPATIENT_CLINIC_OR_DEPARTMENT_OTHER): Payer: 59 | Admitting: Hematology

## 2018-12-08 ENCOUNTER — Encounter: Payer: Self-pay | Admitting: Hematology

## 2018-12-08 ENCOUNTER — Ambulatory Visit
Admission: RE | Admit: 2018-12-08 | Discharge: 2018-12-08 | Disposition: A | Discharge status: Home / Self Care | Payer: 59 | Source: Ambulatory Visit | Attending: Radiation Oncology | Admitting: Radiation Oncology

## 2018-12-08 ENCOUNTER — Telehealth: Payer: Self-pay | Admitting: Hematology

## 2018-12-08 VITALS — BP 129/73 | HR 93 | Temp 97.8°F | Resp 18 | Ht 61.0 in | Wt 143.1 lb

## 2018-12-08 DIAGNOSIS — C775 Secondary and unspecified malignant neoplasm of intrapelvic lymph nodes: Secondary | ICD-10-CM | POA: Diagnosis not present

## 2018-12-08 DIAGNOSIS — D61810 Antineoplastic chemotherapy induced pancytopenia: Secondary | ICD-10-CM

## 2018-12-08 DIAGNOSIS — C210 Malignant neoplasm of anus, unspecified: Secondary | ICD-10-CM

## 2018-12-08 DIAGNOSIS — E1169 Type 2 diabetes mellitus with other specified complication: Secondary | ICD-10-CM

## 2018-12-08 DIAGNOSIS — I10 Essential (primary) hypertension: Secondary | ICD-10-CM

## 2018-12-08 DIAGNOSIS — R197 Diarrhea, unspecified: Secondary | ICD-10-CM

## 2018-12-08 DIAGNOSIS — E119 Type 2 diabetes mellitus without complications: Secondary | ICD-10-CM | POA: Diagnosis not present

## 2018-12-08 DIAGNOSIS — K1231 Oral mucositis (ulcerative) due to antineoplastic therapy: Secondary | ICD-10-CM

## 2018-12-08 LAB — CMP (CANCER CENTER ONLY)
ALT: 19 U/L (ref 0–44)
AST: 20 U/L (ref 15–41)
Albumin: 3.9 g/dL (ref 3.5–5.0)
Alkaline Phosphatase: 62 U/L (ref 38–126)
Anion gap: 10 (ref 5–15)
BUN: 14 mg/dL (ref 8–23)
CO2: 27 mmol/L (ref 22–32)
Calcium: 9.8 mg/dL (ref 8.9–10.3)
Chloride: 105 mmol/L (ref 98–111)
Creatinine: 0.84 mg/dL (ref 0.44–1.00)
GFR, Est AFR Am: >60 mL/min (ref >60)
GFR, Estimated: >60 mL/min (ref >60)
Glucose, Bld: 108 mg/dL — ABNORMAL HIGH (ref 70–99)
Potassium: 4.1 mmol/L (ref 3.5–5.1)
Sodium: 142 mmol/L (ref 135–145)
TOTAL PROTEIN: 7.5 g/dL (ref 6.5–8.1)
Total Bilirubin: 0.8 mg/dL (ref 0.3–1.2)

## 2018-12-08 LAB — CBC WITH DIFFERENTIAL (CANCER CENTER ONLY)
Abs Immature Granulocytes: 0.02 10*3/uL (ref 0.00–0.07)
BASOS PCT: 1 %
Basophils Absolute: 0 10*3/uL (ref 0.0–0.1)
Eosinophils Absolute: 0.1 10*3/uL (ref 0.0–0.5)
Eosinophils Relative: 4 %
HCT: 38.7 % (ref 36.0–46.0)
Hemoglobin: 13 g/dL (ref 12.0–15.0)
Immature Granulocytes: 1 %
Lymphocytes Relative: 5 %
Lymphs Abs: 0.1 10*3/uL — ABNORMAL LOW (ref 0.7–4.0)
MCH: 31.9 pg (ref 26.0–34.0)
MCHC: 33.6 g/dL (ref 30.0–36.0)
MCV: 94.9 fL (ref 80.0–100.0)
Monocytes Absolute: 0.1 10*3/uL (ref 0.1–1.0)
Monocytes Relative: 5 %
NEUTROS PCT: 84 %
Neutro Abs: 2.2 10*3/uL (ref 1.7–7.7)
Platelet Count: 185 10*3/uL (ref 150–400)
RBC: 4.08 MIL/uL (ref 3.87–5.11)
RDW: 14 % (ref 11.5–15.5)
WBC Count: 2.6 10*3/uL — ABNORMAL LOW (ref 4.0–10.5)
nRBC: 0 % (ref 0.0–0.2)

## 2018-12-08 NOTE — Telephone Encounter (Signed)
Scheduled appt per 02/03 los.  Called patient and patient is aware of appt time and date.  Sent a staff message to get the MD visit added for 02/10.

## 2018-12-09 ENCOUNTER — Other Ambulatory Visit: Payer: Self-pay | Admitting: Hematology

## 2018-12-09 ENCOUNTER — Ambulatory Visit
Admission: RE | Admit: 2018-12-09 | Discharge: 2018-12-09 | Disposition: A | Discharge status: Home / Self Care | Payer: 59 | Source: Ambulatory Visit | Attending: Radiation Oncology | Admitting: Radiation Oncology

## 2018-12-09 DIAGNOSIS — C210 Malignant neoplasm of anus, unspecified: Secondary | ICD-10-CM

## 2018-12-10 ENCOUNTER — Ambulatory Visit
Admission: RE | Admit: 2018-12-10 | Discharge: 2018-12-10 | Disposition: A | Discharge status: Home / Self Care | Payer: 59 | Source: Ambulatory Visit | Attending: Radiation Oncology | Admitting: Radiation Oncology

## 2018-12-10 DIAGNOSIS — C210 Malignant neoplasm of anus, unspecified: Secondary | ICD-10-CM | POA: Diagnosis not present

## 2018-12-11 ENCOUNTER — Ambulatory Visit
Admission: RE | Admit: 2018-12-11 | Discharge: 2018-12-11 | Disposition: A | Discharge status: Home / Self Care | Payer: 59 | Source: Ambulatory Visit | Attending: Radiation Oncology | Admitting: Radiation Oncology

## 2018-12-11 DIAGNOSIS — C210 Malignant neoplasm of anus, unspecified: Secondary | ICD-10-CM | POA: Diagnosis not present

## 2018-12-12 ENCOUNTER — Ambulatory Visit
Admission: RE | Admit: 2018-12-12 | Discharge: 2018-12-12 | Disposition: A | Discharge status: Home / Self Care | Payer: 59 | Source: Ambulatory Visit | Attending: Radiation Oncology | Admitting: Radiation Oncology

## 2018-12-12 DIAGNOSIS — C210 Malignant neoplasm of anus, unspecified: Secondary | ICD-10-CM | POA: Diagnosis not present

## 2018-12-12 NOTE — Progress Notes (Signed)
[place]   [phone] [phone]   Clinic Follow up Note   Patient Care Team: Shirline Frees, MD as PCP - General (Family Medicine)  Date of Service:  12/15/2018  CHIEF COMPLAINT: F/u of anal cancer  SUMMARY OF ONCOLOGIC HISTORY:   Anal cancer ([place])   03/19/2018 Genetic Testing    Genetic testing performed through [facility]'s Common Hereditary Cancers Panel reported out on 03/10/2018 showed no pathogenic mutations. The Common Hereditary Cancer Panel offered by [facility] includes sequencing and/or deletion duplication testing of the following 47 genes: APC, ATM, AXIN2, BARD1, BMPR1A, BRCA1, BRCA2, BRIP1, CDH1, CDKN2A (p14ARF), CDKN2A (p16INK4a), CKD4, CHEK2, CTNNA1, DICER1, EPCAM (Deletion/duplication testing only), GREM1 (promoter region deletion/duplication testing only), KIT, MEN1, MLH1, MSH2, MSH3, MSH6, MUTYH, NBN, NF1, NHTL1, PALB2, PDGFRA, PMS2, POLD1, POLE, PTEN, RAD50, RAD51C, RAD51D, SDHB, SDHC, SDHD, SMAD4, SMARCA4. STK11, TP53, TSC1, TSC2, and VHL.  The following genes were evaluated for sequence changes only: SDHA and HOXB13 c.251G>A variant only.  A variant of uncertain significance (VUS) in a gene called PMS2 was also noted. c.1399G>A (p.Val467Ile)    08/14/2018 Imaging    MRI PELVIS W WO Contrast 08/14/18 IMPRESSION: 1. Centrally necrotic 1.8 cm enlarged lymph node versus nonspecific soft tissue mass in the left hemipelvis adjacent to the posterior pelvic sidewall. Further evaluation with biopsy and/or PET-CT is recommended.    09/17/2018 Pathology Results    Diagnosis 09/17/18 Lymph node, needle/core biopsy, left pelvic - SQUAMOUS CELL CARCINOMA. - SEE COMMENT. Microscopic Comment    10/09/2018 PET scan    PET 10/09/18  IMPRESSION: 1. 17 mm left pelvic sidewall lesion is markedly hypermetabolic compatible with known malignancy. 2. No additional definite hypermetabolic disease identified in the neck, chest, abdomen, or  pelvis. 3. Relatively diffuse FDG accumulation in the colon without focal increased uptake above background colonic levels in the region of the anus. 4. Focal FDG accumulation identified along the anterior left femoral neck and in the lumbosacral junction. No underlying bone lesions evident at these locations on CT images. Indeterminate finding although metastatic disease cannot be excluded.     10/13/2018 Initial Biopsy    Diagnosis 10/13/18 Anus, biopsy - SQUAMOUS CELL CARCINOMA. - SEE COMMENT.    10/14/2018 Initial Diagnosis    Anal cancer ([place])    10/14/2018 Cancer Staging    Staging form: Anus, AJCC 8th Edition - Clinical stage from 10/14/2018: Stage IIIA (cT2, cN1b, cM0) - Signed by Truitt Merle, MD on 10/14/2018    11/03/2018 - 12/15/2018 Radiation Therapy    Concurrent chemoRt with Dr. Lisbeth Renshaw for 5-6 weeks starting 11/03/18. Plans to complete on 12/15/18    11/03/2018 - 12/15/2018 Chemotherapy    Concurrent chemoRt with Mitomycin and 5-FU on week 1 and week 5 starting 11/03/18. Week 5 chemo on 12/01/18      CURRENT THERAPY:  Chemoradiation for 6 weeks with Mitomycin and 5-FU on week 1 and week 5starting 11/03/18. Plan to complete radiation on 12/15/18  INTERVAL HISTORY:  Angelica Wright is here for a follow up and end of radiation. She presents to the clinic today with her husband. She notes she completed her last radiation treatment. She notes her rectal pain is 8-9/10. This worsened about 5 days ago. She sits to her side or lays flat and this pain can wake her up. She has not tried any OTC pain medication such as Tylenol. She is willing to have prescription meds as well. She also notes her diarrhea worsened. She has been taking  Imodium 4 times a day. She is interested in trying lomotil. She notes a possible open sore near rectal area with bloody discharge. She has been using neosporin for this.  She notes she is drinking more water but still feels dizzy upon standing. She is on  BP meds, atenolol, but BP normal today. She remains off HCTZ. I reviewed her medication list.      REVIEW OF SYSTEMS:   Constitutional: Denies fevers, chills or abnormal weight loss Eyes: Denies blurriness of vision Ears, nose, mouth, throat, and face: Denies mucositis or sore throat Respiratory: Denies cough, dyspnea or wheezes Cardiovascular: Denies palpitation, chest discomfort or lower extremity swelling Gastrointestinal:  Denies nausea, heartburn(+) Diarrhea (+) Buttocks pain from radiation. (+) Open sore near rectum with discharge Skin: Denies abnormal skin rashes Lymphatics: Denies new lymphadenopathy or easy bruising Neurological:Denies numbness, tingling or new weaknesses Behavioral/Psych: Mood is stable, no new changes  All other systems were reviewed with the patient and are negative.  MEDICAL HISTORY:  Past Medical History:  Diagnosis Date  . Hyperparathyroidism ([place])    resolved after surgery  . Hypertension   . Osteoporosis    of the spine  . Rectocele   . Squamous cell cancer, anus ([facility])   . TMJ syndrome   . Type 2 Diabetes ([place]) 11/2013   controlled with diet/exer    SURGICAL HISTORY: Past Surgical History:  Procedure Laterality Date  . LYMPH NODE BIOPSY    . PARATHYROIDECTOMY  2000  . THERAPEUTIC ABORTION     x 2  . VULVA /PERINEUM BIOPSY  2012    I have reviewed the social history and family history with the patient and they are unchanged from previous note.  ALLERGIES:  is allergic to sulfa antibiotics.  MEDICATIONS:  Current Outpatient Medications  Medication Sig Dispense Refill  . aspirin 81 MG tablet Take 81 mg by mouth every evening.     Marland Kitchen atenolol (TENORMIN) 50 MG tablet Take 50 mg by mouth every evening.     . calcium carbonate (TUMS - DOSED IN MG ELEMENTAL CALCIUM) 500 MG chewable tablet Chew 1 tablet by mouth daily as needed for indigestion or heartburn.    . cholecalciferol (VITAMIN D3) 25 MCG (1000 UT) tablet Take 2,000 Units by mouth  every evening.    . clobetasol ointment (TEMOVATE) 0.05 % Apply a pea sized amount topically BID x 1-2 weeks prn flare 30 g 1  . Coenzyme Q10 (CO Q 10) 100 MG CAPS Take 100 mg by mouth daily.     . hydrochlorothiazide (HYDRODIURIL) 12.5 MG tablet Take 12.5 mg by mouth daily.     Marland Kitchen ibuprofen (ADVIL,MOTRIN) 100 MG tablet Take 400-600 mg by mouth 2 (two) times daily as needed for pain.    . magic mouthwash w/lidocaine SOLN Take 5 mLs by mouth 3 (three) times daily. Swish and spit 5 mls TID as needed. 100 mL 1  . Magnesium 250 MG TABS Take 250 mg by mouth every evening.    . Multiple Vitamin (MULTIVITAMIN WITH MINERALS) TABS tablet Take 1 tablet by mouth daily.    . ondansetron (ZOFRAN) 8 MG tablet Take 1 tablet (8 mg total) by mouth 2 (two) times daily as needed (Nausea or vomiting). 30 tablet 1  . potassium chloride SA (K-DUR,KLOR-CON) 20 MEQ tablet Take 1 tablet (20 mEq total) by mouth daily. 30 tablet 0  . prochlorperazine (COMPAZINE) 10 MG tablet TAKE 1 TABLET (10 MG TOTAL) BY MOUTH EVERY 6 (SIX) HOURS AS NEEDED (  NAUSEA OR VOMITING). 30 tablet 1  . SUPER B COMPLEX/C PO Take 1 tablet by mouth daily.    . valACYclovir (VALTREX) 500 MG tablet Take one tab po BID for 3 days with flares. 30 tablet 1  . vitamin C (ASCORBIC ACID) 500 MG tablet Take 500 mg by mouth every evening.    Marland Kitchen acetaminophen-codeine (TYLENOL #3) 300-30 MG tablet Take 1 tablet by mouth every 4 (four) hours as needed for moderate pain. 15 tablet 0  . diphenoxylate-atropine (LOMOTIL) 2.5-0.025 MG tablet Take 1-2 tablets by mouth 4 (four) times daily as needed for diarrhea or loose stools. 30 tablet 0  . silver sulfADIAZINE (SILVADENE) 1 % cream Apply 1 application topically daily. 50 g 0   No current facility-administered medications for this visit.     PHYSICAL EXAMINATION: ECOG PERFORMANCE STATUS: 2 - Symptomatic, <50% confined to bed  Vitals:   12/15/18 0903 12/15/18 0906  BP: (!) 118/93 103/74  Pulse: 100 (!) 111  Resp:  18 20  Temp:    SpO2: 99% 98%   Filed Weights   12/15/18 0838  Weight: 139 lb 12.8 oz (63.4 kg)    GENERAL:alert, no distress and comfortable SKIN: skin color, texture, turgor are normal, no rashes or significant lesions EYES: normal, Conjunctiva are pink and non-injected, sclera clear OROPHARYNX:no exudate, no erythema and lips, buccal mucosa, and tongue normal  NECK: supple, thyroid normal size, non-tender, without nodularity LYMPH:  no palpable lymphadenopathy in the cervical, axillary or inguinal LUNGS: clear to auscultation and percussion with normal breathing effort HEART: regular rate & rhythm and no murmurs and no lower extremity edema ABDOMEN:abdomen soft, non-tender and normal bowel sounds Musculoskeletal:no cyanosis of digits and no clubbing  NEURO: alert & oriented x 3 with fluent speech, no focal motor/sensory deficits RECTAL EXAM: (+) Diffuse skin erythema and hyperpigmentation of buttocks with skin breakdown and open shallow ulcers from buttocks to rectum with mild bloody discharge.   LABORATORY DATA:  I have reviewed the data as listed CBC Latest Ref Rng & Units 12/15/2018 12/08/2018 12/01/2018  WBC 4.0 - 10.5 K/uL 1.9(L) 2.6(L) 4.0  Hemoglobin 12.0 - 15.0 g/dL 12.1 13.0 12.1  Hematocrit 36.0 - 46.0 % 34.4(L) 38.7 34.9(L)  Platelets 150 - 400 K/uL 93(L) 185 296     CMP Latest Ref Rng & Units 12/15/2018 12/08/2018 12/01/2018  Glucose 70 - 99 mg/dL 123(H) 108(H) 116(H)  BUN 8 - 23 mg/dL '10 14 8  ' Creatinine 0.44 - 1.00 mg/dL 0.84 0.84 0.79  Sodium 135 - 145 mmol/L 138 142 144  Potassium 3.5 - 5.1 mmol/L 3.3(L) 4.1 3.3(L)  Chloride 98 - 111 mmol/L 100 105 108  CO2 22 - 32 mmol/L '25 27 25  ' Calcium 8.9 - 10.3 mg/dL 9.6 9.8 9.4  Total Protein 6.5 - 8.1 g/dL 7.1 7.5 6.9  Total Bilirubin 0.3 - 1.2 mg/dL 0.8 0.8 0.6  Alkaline Phos 38 - 126 U/L 57 62 61  AST 15 - 41 U/L '23 20 20  ' ALT 0 - 44 U/L '22 19 24      ' RADIOGRAPHIC STUDIES: I have personally reviewed the  radiological images as listed and agreed with the findings in the report. No results found.   ASSESSMENT & PLAN:  Angelica Wright is a 63 y.o. female with   1.Anal Cancer,withpositiveleft pelvic lymph node, cT2N1bM0, stage IIIA -She was recently diagnosed in 09/2018. -She has locally advanced squamous cell carcinoma of the anus(in anal canal and low rectum)which has spread to  pelviclymph node. -Her 03/2018 genetic testing was negative except for VUS of gene PMS2. -Shecompleted her last treatment of concurrentChemoradiation today. She has developed skin breakdown with open sore and bloody discharge. She has also had worsened diarrhea. I have called in Silvadene cream for this, she will continue Neosporin cream twice daily. Will monitor.  -Labs reviewed, WBC at 1.9, PLT at 93K, ANC at 0.8. K 3.3. I encouraged her to watch for sign of infection or cold given her pancytopenia.   -Plan for PET scan in 3 months to evaluate her response to chemoRT.  -F/u in 2 weeks   2. HTN and DM -She is on atenolol, and I have held her HCTZ,not on diabetic medication -Managed by her PCP -Will monitor closely, during chemotherapy -Pt notes dizziness upon standing.  She is mildly orthostatic, SBP dropped from 1 35 from 103.  I encouraged her to drink more fluids, especially Gatorade and sports drinks, and to decrease atenolol from 50 mg to 25 mg daily for the next few weeks. She will continue to hold HCTZ.   3. Rectal pain secondary to chemoand radiation  -Her buttock pain has worsened to 8-9/10 form radiation, She will start OTC tylenol and I will prescribe her 10 tabs of Tylenol #3 if Tylenol is not enough.  -On exam she has skin breakdown, open sore with bloody discharge. I will call in silvadene and I encouraged her to continue neosporin.   4. Diarrhea, Hemorrhoids -She has hemorrhoids exacerbated by diarrhea.  -ContinueSITS bath and OTC cream  -Pt notes blood on tissue being from  hemorrhoids.   -Her diarrhea has also worsened form radiation. I encouraged her to increase imodium up to 8 times daily. I will call in Lomotil if that is not enough.  -On KLOR 20 mEq daily. Given diarrhea she will continue oral potassium.  5. Pancytopenia -Secondary to chemotherapy -Wepreviouslydiscussed risk of infection, bleeding, and need for blood transfusion -WBC ta 1.9, Hg normal at 12.7, PLT at 93K, ANC at 0.8 today (12/15/18). Neutropenic precaution reviewed again.  6. Hypokalemia -K 3.3 today, likely secondary to diarrhea.  Will increase potassium chloride to 40 mEq daily for the next week, then reduce to 20 mill equal daily  PLAN: -Prescribe Tylenol #3, silvadene and lomotil today  -She will decrease atenolol dose, and continue holding HCTZ -Increase KCL  -Lab and f/u in 2 weeks     No problem-specific Assessment & Plan notes found for this encounter.   Orders Placed This Encounter  Procedures  . Orthostatic vital signs    Standing Status:   Standing    Number of Occurrences:   1   All questions were answered. The patient knows to call the clinic with any problems, questions or concerns. No barriers to learning was detected. I spent 20 minutes counseling the patient face to face. The total time spent in the appointment was 25 minutes and more than 50% was on counseling and review of test results     Truitt Merle, MD 12/15/2018   I, Joslyn Devon, am acting as scribe for Truitt Merle, MD.   I have reviewed the above documentation for accuracy and completeness, and I agree with the above.

## 2018-12-15 ENCOUNTER — Inpatient Hospital Stay: Payer: 59

## 2018-12-15 ENCOUNTER — Inpatient Hospital Stay (HOSPITAL_BASED_OUTPATIENT_CLINIC_OR_DEPARTMENT_OTHER): Payer: 59 | Admitting: Hematology

## 2018-12-15 ENCOUNTER — Encounter: Payer: Self-pay | Admitting: Radiation Oncology

## 2018-12-15 ENCOUNTER — Encounter: Payer: Self-pay | Admitting: Hematology

## 2018-12-15 ENCOUNTER — Telehealth: Payer: Self-pay

## 2018-12-15 ENCOUNTER — Telehealth: Payer: Self-pay | Admitting: Hematology

## 2018-12-15 ENCOUNTER — Ambulatory Visit
Admission: RE | Admit: 2018-12-15 | Discharge: 2018-12-15 | Disposition: A | Discharge status: Home / Self Care | Payer: 59 | Source: Ambulatory Visit | Attending: Radiation Oncology | Admitting: Radiation Oncology

## 2018-12-15 ENCOUNTER — Ambulatory Visit: Payer: 59

## 2018-12-15 VITALS — BP 103/74 | HR 111 | Temp 98.5°F | Resp 20 | Wt 139.8 lb

## 2018-12-15 DIAGNOSIS — C775 Secondary and unspecified malignant neoplasm of intrapelvic lymph nodes: Secondary | ICD-10-CM | POA: Diagnosis not present

## 2018-12-15 DIAGNOSIS — E876 Hypokalemia: Secondary | ICD-10-CM

## 2018-12-15 DIAGNOSIS — D61810 Antineoplastic chemotherapy induced pancytopenia: Secondary | ICD-10-CM

## 2018-12-15 DIAGNOSIS — C210 Malignant neoplasm of anus, unspecified: Secondary | ICD-10-CM

## 2018-12-15 DIAGNOSIS — I10 Essential (primary) hypertension: Secondary | ICD-10-CM

## 2018-12-15 DIAGNOSIS — E119 Type 2 diabetes mellitus without complications: Secondary | ICD-10-CM

## 2018-12-15 DIAGNOSIS — E1169 Type 2 diabetes mellitus with other specified complication: Secondary | ICD-10-CM

## 2018-12-15 DIAGNOSIS — K1231 Oral mucositis (ulcerative) due to antineoplastic therapy: Secondary | ICD-10-CM

## 2018-12-15 DIAGNOSIS — R197 Diarrhea, unspecified: Secondary | ICD-10-CM

## 2018-12-15 LAB — CMP (CANCER CENTER ONLY)
ALT: 22 U/L (ref 0–44)
ANION GAP: 13 (ref 5–15)
AST: 23 U/L (ref 15–41)
Albumin: 3.4 g/dL — ABNORMAL LOW (ref 3.5–5.0)
Alkaline Phosphatase: 57 U/L (ref 38–126)
BUN: 10 mg/dL (ref 8–23)
CO2: 25 mmol/L (ref 22–32)
Calcium: 9.6 mg/dL (ref 8.9–10.3)
Chloride: 100 mmol/L (ref 98–111)
Creatinine: 0.84 mg/dL (ref 0.44–1.00)
GFR, Est AFR Am: >60 mL/min (ref >60)
GFR, Estimated: >60 mL/min (ref >60)
Glucose, Bld: 123 mg/dL — ABNORMAL HIGH (ref 70–99)
Potassium: 3.3 mmol/L — ABNORMAL LOW (ref 3.5–5.1)
Sodium: 138 mmol/L (ref 135–145)
Total Bilirubin: 0.8 mg/dL (ref 0.3–1.2)
Total Protein: 7.1 g/dL (ref 6.5–8.1)

## 2018-12-15 LAB — CBC WITH DIFFERENTIAL (CANCER CENTER ONLY)
Abs Immature Granulocytes: 0 10*3/uL (ref 0.00–0.07)
Basophils Absolute: 0 10*3/uL (ref 0.0–0.1)
Basophils Relative: 1 %
Eosinophils Absolute: 0 10*3/uL (ref 0.0–0.5)
Eosinophils Relative: 1 %
HCT: 34.4 % — ABNORMAL LOW (ref 36.0–46.0)
Hemoglobin: 12.1 g/dL (ref 12.0–15.0)
Immature Granulocytes: 0 %
Lymphocytes Relative: 4 %
Lymphs Abs: 0.1 10*3/uL — ABNORMAL LOW (ref 0.7–4.0)
MCH: 32.7 pg (ref 26.0–34.0)
MCHC: 35.2 g/dL (ref 30.0–36.0)
MCV: 93 fL (ref 80.0–100.0)
Monocytes Absolute: 1 10*3/uL (ref 0.1–1.0)
Monocytes Relative: 51 %
Neutro Abs: 0.8 10*3/uL — ABNORMAL LOW (ref 1.7–7.7)
Neutrophils Relative %: 43 %
PLATELETS: 93 10*3/uL — AB (ref 150–400)
RBC: 3.7 MIL/uL — AB (ref 3.87–5.11)
RDW: 14.5 % (ref 11.5–15.5)
WBC: 1.9 10*3/uL — AB (ref 4.0–10.5)
nRBC: 0 % (ref 0.0–0.2)

## 2018-12-15 MED ORDER — SILVER SULFADIAZINE 1 % EX CREA: 1.0000 "application " | TOPICAL_CREAM | Freq: Every day | CUTANEOUS | 0 refills | Status: DC

## 2018-12-15 MED ORDER — DIPHENOXYLATE-ATROPINE 2.5-0.025 MG PO TABS: 1.0000 | ORAL_TABLET | Freq: Four times a day (QID) | ORAL | 0 refills | Status: DC | PRN

## 2018-12-15 MED ORDER — ACETAMINOPHEN-CODEINE #3 300-30 MG PO TABS: 1.0000 | ORAL_TABLET | ORAL | 0 refills | Status: DC | PRN

## 2018-12-15 NOTE — Telephone Encounter (Signed)
Left voice message for patient regarding lab results, Potassium low today 3.3, per Dr. Burr Medico increase Potassium to 20 meq one twice daily x 3 days, then go back to taking one once a day.

## 2018-12-15 NOTE — Telephone Encounter (Signed)
Per 2/10 los, lab and f/u already scheduled.

## 2018-12-16 ENCOUNTER — Ambulatory Visit: Payer: 59

## 2018-12-17 ENCOUNTER — Ambulatory Visit: Payer: 59

## 2018-12-22 ENCOUNTER — Ambulatory Visit: Payer: 59 | Attending: Hematology | Admitting: Physical Therapy

## 2018-12-24 ENCOUNTER — Telehealth: Payer: Self-pay

## 2018-12-24 ENCOUNTER — Encounter: Payer: Self-pay | Admitting: Hematology

## 2018-12-24 NOTE — Telephone Encounter (Signed)
Patient calls with the following requests.  She will need two letters:  1) Medical Excuse for Work through end of March.  Stating in recovery phase post treatment.  2) Letter for her brother Marcene Brawn because he cancelled his flight due to her white blood count being so low.  He is filing for insurance to pay for flight.  She will pick up these on Monday 2/24 at her appointment with Dr. Burr Medico.  3) Will bring an insurance form for Dr. Burr Medico to fill out and mail to her.

## 2018-12-25 ENCOUNTER — Other Ambulatory Visit: Payer: Self-pay | Admitting: Hematology

## 2018-12-26 NOTE — Progress Notes (Signed)
[place]   [phone] [phone]   Clinic Follow up Note   Patient Care Team: Shirline Frees, MD as PCP - General (Family Medicine)  Date of Service:  12/29/2018  CHIEF COMPLAINT: F/u of anal cancer  SUMMARY OF ONCOLOGIC HISTORY:   Anal cancer ([place])   03/19/2018 Genetic Testing    Genetic testing performed through [facility]'s Common Hereditary Cancers Panel reported out on 03/10/2018 showed no pathogenic mutations. The Common Hereditary Cancer Panel offered by [facility] includes sequencing and/or deletion duplication testing of the following 47 genes: APC, ATM, AXIN2, BARD1, BMPR1A, BRCA1, BRCA2, BRIP1, CDH1, CDKN2A (p14ARF), CDKN2A (p16INK4a), CKD4, CHEK2, CTNNA1, DICER1, EPCAM (Deletion/duplication testing only), GREM1 (promoter region deletion/duplication testing only), KIT, MEN1, MLH1, MSH2, MSH3, MSH6, MUTYH, NBN, NF1, NHTL1, PALB2, PDGFRA, PMS2, POLD1, POLE, PTEN, RAD50, RAD51C, RAD51D, SDHB, SDHC, SDHD, SMAD4, SMARCA4. STK11, TP53, TSC1, TSC2, and VHL.  The following genes were evaluated for sequence changes only: SDHA and HOXB13 c.251G>A variant only.  A variant of uncertain significance (VUS) in a gene called PMS2 was also noted. c.1399G>A (p.Val467Ile)    08/14/2018 Imaging    MRI PELVIS W WO Contrast 08/14/18 IMPRESSION: 1. Centrally necrotic 1.8 cm enlarged lymph node versus nonspecific soft tissue mass in the left hemipelvis adjacent to the posterior pelvic sidewall. Further evaluation with biopsy and/or PET-CT is recommended.    09/17/2018 Pathology Results    Diagnosis 09/17/18 Lymph node, needle/core biopsy, left pelvic - SQUAMOUS CELL CARCINOMA. - SEE COMMENT. Microscopic Comment    10/09/2018 PET scan    PET 10/09/18  IMPRESSION: 1. 17 mm left pelvic sidewall lesion is markedly hypermetabolic compatible with known malignancy. 2. No additional definite hypermetabolic disease identified in the neck, chest, abdomen, or  pelvis. 3. Relatively diffuse FDG accumulation in the colon without focal increased uptake above background colonic levels in the region of the anus. 4. Focal FDG accumulation identified along the anterior left femoral neck and in the lumbosacral junction. No underlying bone lesions evident at these locations on CT images. Indeterminate finding although metastatic disease cannot be excluded.     10/13/2018 Initial Biopsy    Diagnosis 10/13/18 Anus, biopsy - SQUAMOUS CELL CARCINOMA. - SEE COMMENT.    10/14/2018 Initial Diagnosis    Anal cancer ([place])    10/14/2018 Cancer Staging    Staging form: Anus, AJCC 8th Edition - Clinical stage from 10/14/2018: Stage IIIA (cT2, cN1b, cM0) - Signed by Truitt Merle, MD on 10/14/2018    11/03/2018 - 12/15/2018 Radiation Therapy    Concurrent chemoRt with Dr. Lisbeth Renshaw for 5-6 weeks starting 11/03/18. Plans to complete on 12/15/18    11/03/2018 - 12/15/2018 Chemotherapy    Concurrent chemoRt with Mitomycin and 5-FU on week 1 and week 5 starting 11/03/18. Week 5 chemo on 12/01/18      CURRENT THERAPY:  Surveillance   INTERVAL HISTORY:  Angelica Wright is here for a follow up of post chemoRT. She presents to the clinic today by herself. She notes she is slowly getting better since treatment. She still has moderate diarrhea. She is taking Lomotil twice a day along with imodium. She notes her rectal pain is mostly 3-4/10 and skin is improving. She denies fever, bleeding, abdominal pain, nausea and she has adequate appetite. She feels her acidity level is improving as well, weight is stable.  She notes she is only on Atenolol for BP for now.      REVIEW OF SYSTEMS:   Constitutional: Denies fevers, chills or  abnormal weight loss Eyes: Denies blurriness of vision Ears, nose, mouth, throat, and face: Denies mucositis or sore throat Respiratory: Denies cough, dyspnea or wheezes Cardiovascular: Denies palpitation, chest discomfort or lower extremity  swelling Gastrointestinal:  Denies nausea, heartburn (+) Diarrhea (+) Rectal pain 3-4/10 Skin: Denies abnormal skin rashes Lymphatics: Denies new lymphadenopathy or easy bruising Neurological:Denies numbness, tingling or new weaknesses Behavioral/Psych: Mood is stable, no new changes  All other systems were reviewed with the patient and are negative.  MEDICAL HISTORY:  Past Medical History:  Diagnosis Date  . Hyperparathyroidism ([place])    resolved after surgery  . Hypertension   . Osteoporosis    of the spine  . Rectocele   . Squamous cell cancer, anus ([facility])   . TMJ syndrome   . Type 2 Diabetes ([place]) 11/2013   controlled with diet/exer    SURGICAL HISTORY: Past Surgical History:  Procedure Laterality Date  . LYMPH NODE BIOPSY    . PARATHYROIDECTOMY  2000  . THERAPEUTIC ABORTION     x 2  . VULVA /PERINEUM BIOPSY  2012    I have reviewed the social history and family history with the patient and they are unchanged from previous note.  ALLERGIES:  is allergic to sulfa antibiotics.  MEDICATIONS:  Current Outpatient Medications  Medication Sig Dispense Refill  . acetaminophen-codeine (TYLENOL #3) 300-30 MG tablet Take 1 tablet by mouth every 4 (four) hours as needed for moderate pain. 15 tablet 0  . aspirin 81 MG tablet Take 81 mg by mouth every evening.     Marland Kitchen atenolol (TENORMIN) 50 MG tablet Take 50 mg by mouth every evening.     . calcium carbonate (TUMS - DOSED IN MG ELEMENTAL CALCIUM) 500 MG chewable tablet Chew 1 tablet by mouth daily as needed for indigestion or heartburn.    . cholecalciferol (VITAMIN D3) 25 MCG (1000 UT) tablet Take 2,000 Units by mouth every evening.    . clobetasol ointment (TEMOVATE) 0.05 % Apply a pea sized amount topically BID x 1-2 weeks prn flare 30 g 1  . Coenzyme Q10 (CO Q 10) 100 MG CAPS Take 100 mg by mouth daily.     . diphenoxylate-atropine (LOMOTIL) 2.5-0.025 MG tablet Take 1-2 tablets by mouth 4 (four) times daily as needed for diarrhea  or loose stools. 30 tablet 1  . hydrochlorothiazide (HYDRODIURIL) 12.5 MG tablet Take 12.5 mg by mouth daily.     Marland Kitchen ibuprofen (ADVIL,MOTRIN) 100 MG tablet Take 400-600 mg by mouth 2 (two) times daily as needed for pain.    Marland Kitchen KLOR-CON M20 20 MEQ tablet TAKE 1 TABLET BY MOUTH EVERY DAY 30 tablet 0  . magic mouthwash w/lidocaine SOLN Take 5 mLs by mouth 3 (three) times daily. Swish and spit 5 mls TID as needed. 100 mL 1  . Magnesium 250 MG TABS Take 250 mg by mouth every evening.    . Multiple Vitamin (MULTIVITAMIN WITH MINERALS) TABS tablet Take 1 tablet by mouth daily.    . ondansetron (ZOFRAN) 8 MG tablet Take 1 tablet (8 mg total) by mouth 2 (two) times daily as needed (Nausea or vomiting). 30 tablet 1  . prochlorperazine (COMPAZINE) 10 MG tablet TAKE 1 TABLET (10 MG TOTAL) BY MOUTH EVERY 6 (SIX) HOURS AS NEEDED (NAUSEA OR VOMITING). 30 tablet 1  . silver sulfADIAZINE (SILVADENE) 1 % cream Apply 1 application topically daily. 50 g 0  . SUPER B COMPLEX/C PO Take 1 tablet by mouth daily.    Marland Kitchen  valACYclovir (VALTREX) 500 MG tablet Take one tab po BID for 3 days with flares. 30 tablet 1  . vitamin C (ASCORBIC ACID) 500 MG tablet Take 500 mg by mouth every evening.     No current facility-administered medications for this visit.     PHYSICAL EXAMINATION: ECOG PERFORMANCE STATUS: 1 - Symptomatic but completely ambulatory  Vitals:   12/29/18 0814  BP: 122/64  Pulse: 71  Resp: 18  Temp: 98 F (36.7 C)  SpO2: 99%   Filed Weights   12/29/18 0814  Weight: 139 lb 8 oz (63.3 kg)    GENERAL:alert, no distress and comfortable SKIN: skin color, texture, turgor are normal, no rashes or significant lesions EYES: normal, Conjunctiva are pink and non-injected, sclera clear OROPHARYNX:no exudate, no erythema and lips, buccal mucosa, and tongue normal  NECK: supple, thyroid normal size, non-tender, without nodularity LYMPH:  no palpable lymphadenopathy in the cervical, axillary or inguinal LUNGS:  clear to auscultation and percussion with normal breathing effort HEART: regular rate & rhythm and no murmurs and no lower extremity edema ABDOMEN:abdomen soft, non-tender and normal bowel sounds Musculoskeletal:no cyanosis of digits and no clubbing  NEURO: alert & oriented x 3 with fluent speech, no focal motor/sensory deficits Pt declined rectal exam today   LABORATORY DATA:  I have reviewed the data as listed CBC Latest Ref Rng & Units 12/29/2018 12/15/2018 12/08/2018  WBC 4.0 - 10.5 K/uL 2.8(L) 1.9(L) 2.6(L)  Hemoglobin 12.0 - 15.0 g/dL 11.0(L) 12.1 13.0  Hematocrit 36.0 - 46.0 % 33.6(L) 34.4(L) 38.7  Platelets 150 - 400 K/uL 246 93(L) 185     CMP Latest Ref Rng & Units 12/29/2018 12/15/2018 12/08/2018  Glucose 70 - 99 mg/dL 104(H) 123(H) 108(H)  BUN 8 - 23 mg/dL '13 10 14  ' Creatinine 0.44 - 1.00 mg/dL 0.73 0.84 0.84  Sodium 135 - 145 mmol/L 143 138 142  Potassium 3.5 - 5.1 mmol/L 3.6 3.3(L) 4.1  Chloride 98 - 111 mmol/L 108 100 105  CO2 22 - 32 mmol/L '24 25 27  ' Calcium 8.9 - 10.3 mg/dL 8.9 9.6 9.8  Total Protein 6.5 - 8.1 g/dL 6.4(L) 7.1 7.5  Total Bilirubin 0.3 - 1.2 mg/dL 0.3 0.8 0.8  Alkaline Phos 38 - 126 U/L 54 57 62  AST 15 - 41 U/L 32 23 20  ALT 0 - 44 U/L 34 22 19      RADIOGRAPHIC STUDIES: I have personally reviewed the radiological images as listed and agreed with the findings in the report. No results found.   ASSESSMENT & PLAN:  ECHO PROPP is a 63 y.o. female with   1.Anal Cancer,withpositiveleft pelvic lymph node, cT2N1bM0, stage IIIA -She was recently diagnosed in 09/2018. -She has locally advanced squamous cell carcinoma of the anus(in anal canal and low rectum)which has spread to pelviclymph node. -Her 03/2018 genetic testing was negative except for VUS of gene PMS2. -She recently completed her concurrentChemoradiation. She tolerated moderately with skin breakdown with open sore and bloody discharge and worsened diarrhea towards the end of  treatment.  -She is starting to recover well, diarrhea still moderate, her buttocks is healed well, and rectal pain more manageable. I encouraged her to increase lomotil up to 8 times a day.  -Labs reviewed, WBC at 2.8, HG at 11, PLT 246K, CMP WNL. We reviewed infection precautions. Her counts will recover over time.  -We discussed surveillance. Will scan her in 3-4 months.  -F/u in 2 months, she will f/u with rad/onc in 3-4  weeks   2. HTN and DM -She is on atenolol, and I have held her HCTZ,not on diabetic medication -Managed by her PCP -Will monitor closely, during chemotherapy -I encouraged her to continue drinking water adequately.  -She is currently only on Atenolol. Since stopping chemoRT she should be fine to restart her HCTZ in a few weeks.   3. Rectal painsecondary to chemoand radiation -On Silvadene and neosporin for skin, continue while skin heals.  -Her pain has improved to 3-4/10 since chemoRT completed. Continue Tylenol as needed.   4. Diarrhea, Hemorrhoids -She has hemorrhoids exacerbated by diarrhea.  -ContinueSITS bath and OTC cream  -Pt notes blood on tissue occasionally being from hemorrhoids.  -Her diarrhea is still moderate, I encouraged her to increase lomotil up to 8 times a day and continue imodium as needed. I refilled lomotil today (12/29/18)   5. Pancytopenia -Secondary to chemotherapy -Wepreviouslydiscussed risk of infection, bleeding, and need for blood transfusion -Improved, WBC at 2.8, Hg at 11, PLT at 246K today (12/29/18)  6. Hypokalemia -On potassium chloride to 20 mEq daily, continue  -K is 3.6 today (12/29/18)  PLAN: -Lab and f/u in 7-8 weeks  -refilled lomotil     No problem-specific Assessment & Plan notes found for this encounter.   No orders of the defined types were placed in this encounter.  All questions were answered. The patient knows to call the clinic with any problems, questions or concerns. No barriers to  learning was detected. I spent 15 minutes counseling the patient face to face. The total time spent in the appointment was 20 minutes and more than 50% was on counseling and review of test results     Truitt Merle, MD 12/29/2018   I, Joslyn Devon, am acting as scribe for Truitt Merle, MD.   I have reviewed the above documentation for accuracy and completeness, and I agree with the above.

## 2018-12-29 ENCOUNTER — Telehealth: Payer: Self-pay

## 2018-12-29 ENCOUNTER — Inpatient Hospital Stay (HOSPITAL_BASED_OUTPATIENT_CLINIC_OR_DEPARTMENT_OTHER): Payer: 59 | Admitting: Hematology

## 2018-12-29 ENCOUNTER — Inpatient Hospital Stay: Payer: 59

## 2018-12-29 ENCOUNTER — Telehealth: Payer: Self-pay | Admitting: Hematology

## 2018-12-29 ENCOUNTER — Encounter: Payer: Self-pay | Admitting: Hematology

## 2018-12-29 VITALS — BP 122/64 | HR 71 | Temp 98.0°F | Resp 18 | Ht 61.0 in | Wt 139.5 lb

## 2018-12-29 DIAGNOSIS — I10 Essential (primary) hypertension: Secondary | ICD-10-CM

## 2018-12-29 DIAGNOSIS — E1169 Type 2 diabetes mellitus with other specified complication: Secondary | ICD-10-CM

## 2018-12-29 DIAGNOSIS — C210 Malignant neoplasm of anus, unspecified: Secondary | ICD-10-CM

## 2018-12-29 LAB — CBC WITH DIFFERENTIAL (CANCER CENTER ONLY)
Abs Immature Granulocytes: 0.01 10*3/uL (ref 0.00–0.07)
Basophils Absolute: 0 10*3/uL (ref 0.0–0.1)
Basophils Relative: 0 %
Eosinophils Absolute: 0.1 10*3/uL (ref 0.0–0.5)
Eosinophils Relative: 3 %
HCT: 33.6 % — ABNORMAL LOW (ref 36.0–46.0)
Hemoglobin: 11 g/dL — ABNORMAL LOW (ref 12.0–15.0)
Immature Granulocytes: 0 %
Lymphocytes Relative: 7 %
Lymphs Abs: 0.2 10*3/uL — ABNORMAL LOW (ref 0.7–4.0)
MCH: 32.8 pg (ref 26.0–34.0)
MCHC: 32.7 g/dL (ref 30.0–36.0)
MCV: 100.3 fL — ABNORMAL HIGH (ref 80.0–100.0)
MONOS PCT: 12 %
Monocytes Absolute: 0.3 10*3/uL (ref 0.1–1.0)
NEUTROS PCT: 78 %
Neutro Abs: 2.2 10*3/uL (ref 1.7–7.7)
Platelet Count: 246 10*3/uL (ref 150–400)
RBC: 3.35 MIL/uL — ABNORMAL LOW (ref 3.87–5.11)
RDW: 17 % — ABNORMAL HIGH (ref 11.5–15.5)
WBC Count: 2.8 10*3/uL — ABNORMAL LOW (ref 4.0–10.5)
nRBC: 0 % (ref 0.0–0.2)

## 2018-12-29 LAB — CMP (CANCER CENTER ONLY)
ALT: 34 U/L (ref 0–44)
AST: 32 U/L (ref 15–41)
Albumin: 3.1 g/dL — ABNORMAL LOW (ref 3.5–5.0)
Alkaline Phosphatase: 54 U/L (ref 38–126)
Anion gap: 11 (ref 5–15)
BUN: 13 mg/dL (ref 8–23)
CO2: 24 mmol/L (ref 22–32)
Calcium: 8.9 mg/dL (ref 8.9–10.3)
Chloride: 108 mmol/L (ref 98–111)
Creatinine: 0.73 mg/dL (ref 0.44–1.00)
GFR, Est AFR Am: >60 mL/min (ref >60)
GFR, Estimated: >60 mL/min (ref >60)
GLUCOSE: 104 mg/dL — AB (ref 70–99)
Potassium: 3.6 mmol/L (ref 3.5–5.1)
Sodium: 143 mmol/L (ref 135–145)
Total Bilirubin: 0.3 mg/dL (ref 0.3–1.2)
Total Protein: 6.4 g/dL — ABNORMAL LOW (ref 6.5–8.1)

## 2018-12-29 MED ORDER — DIPHENOXYLATE-ATROPINE 2.5-0.025 MG PO TABS: 1.0000 | ORAL_TABLET | Freq: Four times a day (QID) | ORAL | 1 refills | Status: DC | PRN

## 2018-12-29 NOTE — Telephone Encounter (Signed)
Mailed insurance form and last OV note to patient's home address.

## 2018-12-29 NOTE — Telephone Encounter (Signed)
Called and scheduled appt per 2/24 los.  Patient aware of appt date and time.

## 2019-01-05 ENCOUNTER — Ambulatory Visit: Payer: 59 | Admitting: Physical Therapy

## 2019-01-12 ENCOUNTER — Telehealth: Payer: Self-pay | Admitting: Physical Therapy

## 2019-01-12 ENCOUNTER — Ambulatory Visit: Payer: 59 | Attending: Hematology | Admitting: Physical Therapy

## 2019-01-12 NOTE — Telephone Encounter (Signed)
Patient did not show for appointment.  Patient was called and PT left message to please call us back.  Gustavus Bryant, PT 01/12/19 10:05 AM

## 2019-01-19 ENCOUNTER — Telehealth: Payer: Self-pay | Admitting: Radiation Oncology

## 2019-01-19 ENCOUNTER — Ambulatory Visit: Payer: 59 | Admitting: Physical Therapy

## 2019-01-19 MED ORDER — HYDROCORTISONE ACETATE 25 MG RE SUPP: 25.0000 mg | Freq: Two times a day (BID) | RECTAL | 1 refills | Status: DC

## 2019-01-19 NOTE — Telephone Encounter (Signed)
I spoke with the patient this morning to let her know that out of abundance of caution with the coronavirus pandemic that we are canceling nonurgent medical follow-up appointments.  By phone we were able to talk about her symptoms which still include frequency to have a bowel movement, pain with passing stool, and intermittent episodes of bleeding.  She is having anywhere between 0-3 stools per day, she still has prescriptions for Lomotil, but is considering a stool softener just because of the pain.  We discussed the options of over-the-counter hemorrhoid relieving creams and suppositories versus prescription strength.  I discussed with her that I would anticipate needing medication for at least the next week given her symptoms.  She was interested in prescription strength Anusol suppositories which I called into her pharmacy.  I will follow-up with her next week by phone to see that she is doing well.  She is in agreement with this plan and does not feel that she needs to come tomorrow for further assessment.  I will cancel her appointment.  She will continue to follow-up with Dr. Burr Medico.  Next week we will also discuss further long-term plans of being seen by pelvic floor rehabilitation, and can provide vaginal dilators to her as well.

## 2019-01-20 ENCOUNTER — Ambulatory Visit: Payer: 59 | Admitting: Radiation Oncology

## 2019-01-20 NOTE — Progress Notes (Signed)
  Radiation Oncology         [phone] ________________________________  Name: Angelica Wright MRN: [id number]  Date: 12/15/2018  DOB: [birth year]  End of Treatment Note  Diagnosis: 63 y.o. female with Stage IIIA, cT2N1bM0 squamous cell carcinoma of the anus involving left pelvic sidewall node  Indication for treatment:  curative       Radiation treatment dates:   11/03/2018 - 12/15/2018  Site/dose:   The anus was treated with a course of IMRT using a simultaneous integrated boost technique. Daily image guidance was used during the treatment. The high dose region received a total of 54 Gy in 30 fractions.  Beams/energy:   IMRT / 6X Photon  Narrative: The patient tolerated radiation treatment relatively well.   The patient experienced increased fatigue and skin irritation as expected by the end of treatment. She also experienced weight loss resulting from very poor appetite and frequent diarrhea. She was encouraged to stay hydrated. She also noted dysuria and dark vaginal bleeding.  Plan: The patient has completed radiation treatment. The patient will return to radiation oncology clinic for routine followup in one month. I advised the patient to call or return sooner if they have any questions or concerns related to their recovery or treatment. ________________________________  Jodelle Gross, MD, PhD  This document serves as a record of services personally performed by Kyung Rudd, MD. It was created on his behalf by Rae Lips, a trained medical scribe. The creation of this record is based on the scribe's personal observations and the provider's statements to them. This document has been checked and approved by the attending provider.

## 2019-01-26 ENCOUNTER — Telehealth: Payer: Self-pay

## 2019-01-26 NOTE — Telephone Encounter (Signed)
Patient requested copy of pathology report be mailed to her home address.  This was done today.

## 2019-01-28 ENCOUNTER — Telehealth: Payer: Self-pay | Admitting: Radiation Oncology

## 2019-01-28 NOTE — Telephone Encounter (Addendum)
I called the patient to check to see how bowels are moving and if suppository worked since our last discussion. I LM for her to call us back at her convenience.

## 2019-02-02 ENCOUNTER — Ambulatory Visit: Payer: 59 | Admitting: Obstetrics and Gynecology

## 2019-02-03 ENCOUNTER — Telehealth: Payer: Self-pay | Admitting: Radiation Oncology

## 2019-02-03 DIAGNOSIS — C210 Malignant neoplasm of anus, unspecified: Secondary | ICD-10-CM

## 2019-02-03 NOTE — Telephone Encounter (Signed)
I called the patient to check on her. She's doing better with her bowels but is still having difficulty with her skin. We discussed topical barriers such as desitin and aquaphor as well as cool sitz type baths/showers. She will let us know if she has any progressive symptoms. We could try silvadene but she does have a sensitivity to sulfa. I think it would be reasonable to proceed if these other conventional approaches don't help. We also discussed vaginal dilators and will send this up to Dr. Ernestina Penna office for her visit on 4/13. She was offered referral to [facility] as well to discuss pelvic floor rehab. We will see her back as needed moving forward.     Carola Rhine, PAC

## 2019-02-13 NOTE — Progress Notes (Signed)
[place]   [phone] [phone]   Clinic Follow up Note   Patient Care Team: Shirline Frees, MD as PCP - General (Family Medicine)  Date of Service:  02/16/2019  CHIEF COMPLAINT: F/u of anal cancer  SUMMARY OF ONCOLOGIC HISTORY:   Anal cancer ([place])   03/19/2018 Genetic Testing    Genetic testing performed through [facility]'s Common Hereditary Cancers Panel reported out on 03/10/2018 showed no pathogenic mutations. The Common Hereditary Cancer Panel offered by [facility] includes sequencing and/or deletion duplication testing of the following 47 genes: APC, ATM, AXIN2, BARD1, BMPR1A, BRCA1, BRCA2, BRIP1, CDH1, CDKN2A (p14ARF), CDKN2A (p16INK4a), CKD4, CHEK2, CTNNA1, DICER1, EPCAM (Deletion/duplication testing only), GREM1 (promoter region deletion/duplication testing only), KIT, MEN1, MLH1, MSH2, MSH3, MSH6, MUTYH, NBN, NF1, NHTL1, PALB2, PDGFRA, PMS2, POLD1, POLE, PTEN, RAD50, RAD51C, RAD51D, SDHB, SDHC, SDHD, SMAD4, SMARCA4. STK11, TP53, TSC1, TSC2, and VHL.  The following genes were evaluated for sequence changes only: SDHA and HOXB13 c.251G>A variant only.  A variant of uncertain significance (VUS) in a gene called PMS2 was also noted. c.1399G>A (p.Val467Ile)    08/14/2018 Imaging    MRI PELVIS W WO Contrast 08/14/18 IMPRESSION: 1. Centrally necrotic 1.8 cm enlarged lymph node versus nonspecific soft tissue mass in the left hemipelvis adjacent to the posterior pelvic sidewall. Further evaluation with biopsy and/or PET-CT is recommended.    09/17/2018 Pathology Results    Diagnosis 09/17/18 Lymph node, needle/core biopsy, left pelvic - SQUAMOUS CELL CARCINOMA. - SEE COMMENT. Microscopic Comment    10/09/2018 PET scan    PET 10/09/18  IMPRESSION: 1. 17 mm left pelvic sidewall lesion is markedly hypermetabolic compatible with known malignancy. 2. No additional definite hypermetabolic disease identified in the neck, chest, abdomen, or pelvis.  3. Relatively diffuse FDG accumulation in the colon without focal increased uptake above background colonic levels in the region of the anus. 4. Focal FDG accumulation identified along the anterior left femoral neck and in the lumbosacral junction. No underlying bone lesions evident at these locations on CT images. Indeterminate finding although metastatic disease cannot be excluded.     10/13/2018 Initial Biopsy    Diagnosis 10/13/18 Anus, biopsy - SQUAMOUS CELL CARCINOMA. - SEE COMMENT.    10/14/2018 Initial Diagnosis    Anal cancer ([place])    10/14/2018 Cancer Staging    Staging form: Anus, AJCC 8th Edition - Clinical stage from 10/14/2018: Stage IIIA (cT2, cN1b, cM0) - Signed by Truitt Merle, MD on 10/14/2018    11/03/2018 - 12/15/2018 Radiation Therapy    Concurrent chemoRt with Dr. Lisbeth Renshaw for 5-6 weeks starting 11/03/18. Plans to complete on 12/15/18    11/03/2018 - 12/15/2018 Chemotherapy    Concurrent chemoRt with Mitomycin and 5-FU on week 1 and week 5 starting 11/03/18. Week 5 chemo on 12/01/18     Chemotherapy    The patient had mitoMYcin (MUTAMYCIN) chemo injection 18 mg, 10.2 mg/m2 = 17.5 mg, Intravenous,  Once, 1 of 1 cycle  Dose modification: 7.5 mg/m2 (original dose 7.5 mg/m2, Cycle 1)  Administration: 18 mg (11/03/2018), 13 mg (12/01/2018)    fluorouracil (ADRUCIL) 7,000 mg in sodium chloride 0.9 % 110 mL chemo infusion, 1,000 mg/m2/day = 7,000 mg, Intravenous, 4D (96 hours ), 1 of 1 cycle  Administration: 7,000 mg (11/03/2018), 7,000 mg (12/01/2018)  for chemotherapy treatment.        CURRENT THERAPY:  Surveillance   INTERVAL HISTORY:  Angelica Wright is here for a follow up of anal cancer. She presents to  the clinic today by herself.  She is recovering well from chemo and radiation.  Her appetite and energy level has improved.  She still has burning sensation at the open of vagina when she urinates, and occasional diarrhea, for which she takes Imodium.  She  noticed raw fruits, including vegetable and fruits cause diarrhea.  He does take a multivitamin.  Denies any significant pain, or other discomfort.   REVIEW OF SYSTEMS:   Constitutional: Denies fevers, chills or abnormal weight loss Eyes: Denies blurriness of vision Ears, nose, mouth, throat, and face: Denies mucositis or sore throat Respiratory: Denies cough, dyspnea or wheezes Cardiovascular: Denies palpitation, chest discomfort or lower extremity swelling Gastrointestinal:  Denies nausea, heartburn or change in bowel habits, (+) occasional diarrhea Skin: Denies abnormal skin rashes Lymphatics: Denies new lymphadenopathy or easy bruising Neurological:Denies numbness, tingling or new weaknesses Behavioral/Psych: Mood is stable, no new changes  All other systems were reviewed with the patient and are negative.  MEDICAL HISTORY:  Past Medical History:  Diagnosis Date  . Hyperparathyroidism ([place])    resolved after surgery  . Hypertension   . Osteoporosis    of the spine  . Rectocele   . Squamous cell cancer, anus ([facility])   . TMJ syndrome   . Type 2 Diabetes ([place]) 11/2013   controlled with diet/exer    SURGICAL HISTORY: Past Surgical History:  Procedure Laterality Date  . LYMPH NODE BIOPSY    . PARATHYROIDECTOMY  2000  . THERAPEUTIC ABORTION     x 2  . VULVA /PERINEUM BIOPSY  2012    I have reviewed the social history and family history with the patient and they are unchanged from previous note.  ALLERGIES:  is allergic to sulfa antibiotics.  MEDICATIONS:  Current Outpatient Medications  Medication Sig Dispense Refill  . acetaminophen-codeine (TYLENOL #3) 300-30 MG tablet Take 1 tablet by mouth every 4 (four) hours as needed for moderate pain. 15 tablet 0  . aspirin 81 MG tablet Take 81 mg by mouth every evening.     Marland Kitchen atenolol (TENORMIN) 50 MG tablet Take 50 mg by mouth every evening.     . calcium carbonate (TUMS - DOSED IN MG ELEMENTAL CALCIUM) 500 MG chewable tablet  Chew 1 tablet by mouth daily as needed for indigestion or heartburn.    . cholecalciferol (VITAMIN D3) 25 MCG (1000 UT) tablet Take 2,000 Units by mouth every evening.    . clobetasol ointment (TEMOVATE) 0.05 % Apply a pea sized amount topically BID x 1-2 weeks prn flare 30 g 1  . Coenzyme Q10 (CO Q 10) 100 MG CAPS Take 100 mg by mouth daily.     . diphenoxylate-atropine (LOMOTIL) 2.5-0.025 MG tablet Take 1-2 tablets by mouth 4 (four) times daily as needed for diarrhea or loose stools. 30 tablet 1  . hydrochlorothiazide (HYDRODIURIL) 12.5 MG tablet Take 12.5 mg by mouth daily.     . hydrocortisone (ANUSOL-HC) 25 MG suppository Place 1 suppository (25 mg total) rectally 2 (two) times daily. 12 suppository 1  . ibuprofen (ADVIL,MOTRIN) 100 MG tablet Take 400-600 mg by mouth 2 (two) times daily as needed for pain.    Marland Kitchen KLOR-CON M20 20 MEQ tablet TAKE 1 TABLET BY MOUTH EVERY DAY 30 tablet 0  . magic mouthwash w/lidocaine SOLN Take 5 mLs by mouth 3 (three) times daily. Swish and spit 5 mls TID as needed. 100 mL 1  . Magnesium 250 MG TABS Take 250 mg by mouth every evening.    Marland Kitchen  Multiple Vitamin (MULTIVITAMIN WITH MINERALS) TABS tablet Take 1 tablet by mouth daily.    . silver sulfADIAZINE (SILVADENE) 1 % cream Apply 1 application topically daily. 50 g 0  . SUPER B COMPLEX/C PO Take 1 tablet by mouth daily.    . valACYclovir (VALTREX) 500 MG tablet Take one tab po BID for 3 days with flares. 30 tablet 1  . vitamin C (ASCORBIC ACID) 500 MG tablet Take 500 mg by mouth every evening.    . ondansetron (ZOFRAN) 8 MG tablet Take 1 tablet (8 mg total) by mouth 2 (two) times daily as needed (Nausea or vomiting). (Patient not taking: Reported on 02/16/2019) 30 tablet 1  . prochlorperazine (COMPAZINE) 10 MG tablet TAKE 1 TABLET (10 MG TOTAL) BY MOUTH EVERY 6 (SIX) HOURS AS NEEDED (NAUSEA OR VOMITING). (Patient not taking: Reported on 02/16/2019) 30 tablet 1   No current facility-administered medications for this  visit.     PHYSICAL EXAMINATION: ECOG PERFORMANCE STATUS: 1 - Symptomatic but completely ambulatory  Vitals:   02/16/19 0849  BP: 129/62  Pulse: 77  Resp: 18  Temp: 98.2 F (36.8 C)  SpO2: 100%   Filed Weights   02/16/19 0849  Weight: 141 lb 14.4 oz (64.4 kg)    GENERAL:alert, no distress and comfortable SKIN: skin color, texture, turgor are normal, no rashes or significant lesions EYES: normal, Conjunctiva are pink and non-injected, sclera clear OROPHARYNX:no exudate, no erythema and lips, buccal mucosa, and tongue normal  NECK: supple, thyroid normal size, non-tender, without nodularity LYMPH:  no palpable lymphadenopathy in the cervical, axillary or inguinal LUNGS: clear to auscultation and percussion with normal breathing effort HEART: regular rate & rhythm and no murmurs and no lower extremity edema ABDOMEN:abdomen soft, non-tender and normal bowel sounds Musculoskeletal:no cyanosis of digits and no clubbing  NEURO: alert & oriented x 3 with fluent speech, no focal motor/sensory deficits Rectal exam deferred today   LABORATORY DATA:  I have reviewed the data as listed CBC Latest Ref Rng & Units 02/16/2019 12/29/2018 12/15/2018  WBC 4.0 - 10.5 K/uL 5.4 2.8(L) 1.9(L)  Hemoglobin 12.0 - 15.0 g/dL 11.3(L) 11.0(L) 12.1  Hematocrit 36.0 - 46.0 % 34.8(L) 33.6(L) 34.4(L)  Platelets 150 - 400 K/uL 239 246 93(L)     CMP Latest Ref Rng & Units 02/16/2019 12/29/2018 12/15/2018  Glucose 70 - 99 mg/dL 108(H) 104(H) 123(H)  BUN 8 - 23 mg/dL _0 Creatinine 0.44 - 1.00 mg/dL 0.79 0.73 0.84  Sodium 135 - 145 mmol/L 142 143 138  Potassium 3.5 - 5.1 mmol/L 4.4 3.6 3.3(L)  Chloride 98 - 111 mmol/L 108 108 100  CO2 22 - 32 mmol/L _1 Calcium 8.9 - 10.3 mg/dL 9.2 8.9 9.6  Total Protein 6.5 - 8.1 g/dL 7.0 6.4(L) 7.1  Total Bilirubin 0.3 - 1.2 mg/dL 0.5 0.3 0.8  Alkaline Phos 38 - 126 U/L 81 54 57  AST 15 - 41 U/L 22 32 23  ALT 0 - 44 U/L 19 34 22      RADIOGRAPHIC  STUDIES: I have personally reviewed the radiological images as listed and agreed with the findings in the report. No results found.   ASSESSMENT & PLAN:  Angelica Wright is a 63 y.o. female with   1.Anal Cancer,withpositiveleft pelvic lymph node, cT2N1bM0, stage IIIA -She was recently diagnosed in 09/2018. -She has locally advanced squamous cell carcinoma of the anus(in anal canal and low rectum)which has spread to pelviclymph node. -Her 03/2018  genetic testing was negative except for VUS of gene PMS2. -she completed concurrent chemoRT on 12/25/2018, and is recovering well from treatment -Labs reviewed, mild anemia, the rest of CBC AND CMP WNL. We discussed her infection, current COVID-19 pandemic. She has been very cautious, and he is already action precautions. -We discussed surveillance. Will scan her in 2-3 months to evaluate her response to tretment, due to the current COVID-19 pandemic will postpone her PET scan for 1-2 months  -F/u in 2 months, she will f/u with rad/onc in 3-4 weeks   2. HTN and DM -She is on atenolol,and I have held herHCTZ during chemo,not on diabetic medication -Managed by her PCP -Will monitor closely, during chemotherapy -I encouraged her to continue drinking water adequately.     3.Rectal painsecondary to chemoand radiation -resolved  She still has vaginal burning sensation when she urinates, likely secondary to radiation. -She has had pelvic exercise still doing vaginal dilatation.  4.  Mild anemia -Secondary chemotherapy, continue monitoring -I encourage her to take prenatal vitamins  PLAN: -Follow-up in 2 to 3 months with a PET scan a few days before   No problem-specific Assessment & Plan notes found for this encounter.   Orders Placed This Encounter  Procedures  . NM PET Image Restag (PS) Skull Base To Thigh    Standing Status:   Future    Standing Expiration Date:   02/16/2020    Order Specific Question:   If indicated  for the ordered procedure, I authorize the administration of a radiopharmaceutical per Radiology protocol    Answer:   Yes    Order Specific Question:   Preferred imaging location?    Answer:   [facility]    Order Specific Question:   Radiology Contrast Protocol - do NOT remove file path    Answer:   [email or web address]   All questions were answered. The patient knows to call the clinic with any problems, questions or concerns. No barriers to learning was detected. I spent 20 minutes counseling the patient face to face. The total time spent in the appointment was 25 minutes and more than 50% was on counseling and review of test results     Truitt Merle, MD 02/16/2019   I, Joslyn Devon, am acting as scribe for Truitt Merle, MD.   I have reviewed the above documentation for accuracy and completeness, and I agree with the above.

## 2019-02-16 ENCOUNTER — Inpatient Hospital Stay (HOSPITAL_BASED_OUTPATIENT_CLINIC_OR_DEPARTMENT_OTHER): Payer: 59 | Admitting: Hematology

## 2019-02-16 ENCOUNTER — Other Ambulatory Visit: Payer: Self-pay

## 2019-02-16 ENCOUNTER — Telehealth: Payer: Self-pay | Admitting: Hematology

## 2019-02-16 ENCOUNTER — Inpatient Hospital Stay: Payer: 59 | Attending: Radiation Oncology

## 2019-02-16 ENCOUNTER — Encounter: Payer: Self-pay | Admitting: Hematology

## 2019-02-16 VITALS — BP 129/62 | HR 77 | Temp 98.2°F | Resp 18 | Ht 61.0 in | Wt 141.9 lb

## 2019-02-16 DIAGNOSIS — I10 Essential (primary) hypertension: Secondary | ICD-10-CM | POA: Diagnosis not present

## 2019-02-16 DIAGNOSIS — D6481 Anemia due to antineoplastic chemotherapy: Secondary | ICD-10-CM

## 2019-02-16 DIAGNOSIS — E119 Type 2 diabetes mellitus without complications: Secondary | ICD-10-CM

## 2019-02-16 DIAGNOSIS — C210 Malignant neoplasm of anus, unspecified: Secondary | ICD-10-CM

## 2019-02-16 DIAGNOSIS — Z85048 Personal history of other malignant neoplasm of rectum, rectosigmoid junction, and anus: Secondary | ICD-10-CM | POA: Diagnosis not present

## 2019-02-16 DIAGNOSIS — R197 Diarrhea, unspecified: Secondary | ICD-10-CM

## 2019-02-16 LAB — CBC WITH DIFFERENTIAL (CANCER CENTER ONLY)
Abs Immature Granulocytes: 0.03 10*3/uL (ref 0.00–0.07)
Basophils Absolute: 0 10*3/uL (ref 0.0–0.1)
Basophils Relative: 0 %
Eosinophils Absolute: 0.1 10*3/uL (ref 0.0–0.5)
Eosinophils Relative: 1 %
HCT: 34.8 % — ABNORMAL LOW (ref 36.0–46.0)
Hemoglobin: 11.3 g/dL — ABNORMAL LOW (ref 12.0–15.0)
Immature Granulocytes: 1 %
Lymphocytes Relative: 9 %
Lymphs Abs: 0.5 10*3/uL — ABNORMAL LOW (ref 0.7–4.0)
MCH: 35.3 pg — ABNORMAL HIGH (ref 26.0–34.0)
MCHC: 32.5 g/dL (ref 30.0–36.0)
MCV: 108.8 fL — ABNORMAL HIGH (ref 80.0–100.0)
Monocytes Absolute: 0.6 10*3/uL (ref 0.1–1.0)
Monocytes Relative: 11 %
Neutro Abs: 4.2 10*3/uL (ref 1.7–7.7)
Neutrophils Relative %: 78 %
Platelet Count: 239 10*3/uL (ref 150–400)
RBC: 3.2 MIL/uL — ABNORMAL LOW (ref 3.87–5.11)
RDW: 14.5 % (ref 11.5–15.5)
WBC Count: 5.4 10*3/uL (ref 4.0–10.5)
nRBC: 0 % (ref 0.0–0.2)

## 2019-02-16 LAB — CMP (CANCER CENTER ONLY)
ALT: 19 U/L (ref 0–44)
AST: 22 U/L (ref 15–41)
Albumin: 3.6 g/dL (ref 3.5–5.0)
Alkaline Phosphatase: 81 U/L (ref 38–126)
Anion gap: 10 (ref 5–15)
BUN: 18 mg/dL (ref 8–23)
CO2: 24 mmol/L (ref 22–32)
Calcium: 9.2 mg/dL (ref 8.9–10.3)
Chloride: 108 mmol/L (ref 98–111)
Creatinine: 0.79 mg/dL (ref 0.44–1.00)
GFR, Est AFR Am: >60 mL/min (ref >60)
GFR, Estimated: >60 mL/min (ref >60)
Glucose, Bld: 108 mg/dL — ABNORMAL HIGH (ref 70–99)
Potassium: 4.4 mmol/L (ref 3.5–5.1)
Sodium: 142 mmol/L (ref 135–145)
Total Bilirubin: 0.5 mg/dL (ref 0.3–1.2)
Total Protein: 7 g/dL (ref 6.5–8.1)

## 2019-02-16 NOTE — Telephone Encounter (Signed)
Scheduled appt per 4/13 los. °

## 2019-02-23 ENCOUNTER — Telehealth: Payer: Self-pay | Admitting: Hematology

## 2019-02-23 ENCOUNTER — Telehealth: Payer: Self-pay

## 2019-02-23 NOTE — Telephone Encounter (Signed)
I called pt back and answered her questions.  She completed chemo and radiation in February WE discussed that her risk of bacterial infection is much lower now, however her immune system to prevent viral infection is still weak, I advised her to be cautious about contracting viral infections when she returns to work, due to the current COVID-19 pandemic.  She voiced good understanding and appreciated the call.   Truitt Merle  02/23/2019

## 2019-02-23 NOTE — Telephone Encounter (Signed)
Patient calls wanting Dr. Ernestina Penna advice/opinion on two things.    1)  She received a jury summons for 6/3, should she do that, if Dr. Burr Medico advises against she will need a letter from her to submit.  2)  Going back to work.  She is thinking about going back in May 3 days a week. 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    I advised her I would forward this to Dr. Burr Medico for her advice and call her back.  [phone]

## 2019-02-24 ENCOUNTER — Ambulatory Visit: Payer: 59 | Attending: Hematology | Admitting: Physical Therapy

## 2019-02-24 ENCOUNTER — Encounter: Payer: Self-pay | Admitting: Physical Therapy

## 2019-02-24 ENCOUNTER — Other Ambulatory Visit: Payer: Self-pay

## 2019-02-24 DIAGNOSIS — C210 Malignant neoplasm of anus, unspecified: Secondary | ICD-10-CM

## 2019-02-24 DIAGNOSIS — R279 Unspecified lack of coordination: Secondary | ICD-10-CM

## 2019-02-24 DIAGNOSIS — M6281 Muscle weakness (generalized): Secondary | ICD-10-CM

## 2019-02-24 DIAGNOSIS — R252 Cramp and spasm: Secondary | ICD-10-CM | POA: Diagnosis not present

## 2019-02-24 NOTE — Patient Instructions (Addendum)
aquaphor Moisturizers . They are used in the vagina to hydrate the mucous membrane that make up the vaginal canal. . Designed to keep a more normal acid balance (ph) . Once placed in the vagina, it will last between two to three days.  . Use 2-3 times per week at bedtime and last longer than 60 min. . Ingredients to avoid is glycerin and fragrance, can increase chance of infection . Should not be used just before sex due to causing irritation . Most are gels administered either in a tampon-shaped applicator or as a vaginal suppository. They are non-hormonal.   Types of Moisturizers into the vaginal canal . Luvena- drug store . Vitamin E vaginal suppositories- Whole foods, Amazon . Moist Again . Coconut oil- can break down condoms . Julva- (Do no use if on Tamoxifen) amazon . Yes moisturizer- amazon . NeuEve Silk , NeuEve Silver for menopausal or over 65 (if have severe vaginal atrophy or cancer treatments use NeuEve Silk for  1 month than move to [employer])- [employer], MapleFlower.dk . Olive and Bee intimate cream- www.oliveandbee.com.au . Mae vaginal moisturizer- Amazon  Creams to use externally on the Vulva area  Albertson's (good for for cancer patients that had radiation to the area)- [place] or [employer].FlyingBasics.com.br  V-magic cream - amazon  Julva-amazon  Vital "V [place] salve ( help moisturize and help with thinning vulvar area, does have [place] by Irwin Brakeman labial moisturizer ([place],   Aquaphor   Things to avoid in the vaginal area . Do not use things to irritate the vulvar area . No lotions just specialized creams for the vulva area- Neogyn, V-magic, No soaps; can use Aveeno or Calendula cleanser if needed. Must be gentle . No deodorants . No douches . Good to sleep without underwear to let the vaginal area to air out . No scrubbing: spread the lips to let warm water rinse over  labias and pat dry   Guided Meditation for Pelvic Floor Relaxation  [employer]em[employer]itness   Pelvic Floor Release Stretches  [employer]  use dilatator to massage around the vulva and peroneal body daily, separate the clitoris; massage around the anus  Use creams on vulva several time per day and prior to use the dilator Use the internal cream 2 times per week.   [facility] [address] [place], [place] [address] Phone # [phone] Fax [phone]

## 2019-02-24 NOTE — Therapy (Addendum)
[facility] [address] [place], [place], [address] Phone: [phone]   Fax:  [phone]  Physical Therapy Evaluation  Patient Details  Name: Angelica Wright MRN: [id number] Date of Birth: 1956/06/03 Referring Provider (PT): Worthy Flank [facility]   Encounter Date: 02/24/2019  PT End of Session - 02/24/19 1825    Visit Number  1    Date for PT Re-Evaluation  06/26/19    Authorization Type  [employer]     Authorization - Visit Number  1    Authorization - Number of Visits  22    PT Start Time  1400    PT Stop Time  1455    PT Time Calculation (min)  55 min    Activity Tolerance  Patient tolerated treatment well    Behavior During Therapy  [facility] for tasks assessed/performed       Past Medical History:  Diagnosis Date  . Hyperparathyroidism ([place])    resolved after surgery  . Hypertension   . Osteoporosis    of the spine  . Rectocele   . Squamous cell cancer, anus (HCC)   . TMJ syndrome   . Type 2 Diabetes ([place]) 11/2013   controlled with diet/exer    Past Surgical History:  Procedure Laterality Date  . LYMPH NODE BIOPSY    . PARATHYROIDECTOMY  2000  . THERAPEUTIC ABORTION     x 2  . VULVA /PERINEUM BIOPSY  2012    There were no vitals filed for this visit.   Subjective Assessment - 02/24/19 1301    Subjective  Vaginal stenosis. Urination is burning. Patient can empty her bladder and last few weeks there is more urinary leakage. Patient drinks water, coffee and tea while taking a diuretic. Patient urinates every 3 hours and rarely wakes up to go to the bathroom. Some days the bowel movements are loose or hard. Some days go more than others. Sometimes will leak stool if she is not able to get to the bathroom in time.  pain with bowels are coming through the anus.  Stool is pinky width.     Patient Stated Goals  strenthen the pelvic floor; reduce leakage    Currently in Pain?  Yes    Pain Score  8     Pain  Location  Rectum   vagina   Pain Orientation  Mid    Pain Descriptors / Indicators  Sharp   razor   Pain Type  Acute pain    Pain Onset  More than a month ago    Pain Frequency  Intermittent    Aggravating Factors   during bowel movement; pain with urination    Pain Relieving Factors  no bowel movement or urination         [facility] PT Assessment - 02/24/19 0001      Assessment   Medical Diagnosis  C21.0 (ICD-10-CM) - Anal cancer ([place])    Referring Provider (PT)  Worthy Flank [facility]    Prior Therapy  yes      Precautions   Precautions  Other (comment)    Precaution Comments  cancer with radiation and chemo; osteoporosis      Restrictions   Weight Bearing Restrictions  No      Balance Screen   Has the patient fallen in the past 6 months  No    Has the patient had a decrease in activity level because of a fear of falling?   No  Is the patient reluctant to leave their home because of a fear of falling?   No      Home Film/video editor residence    Living Arrangements  Spouse/significant other    Type of [place]  Two level      Prior Function   Level of Independence  Independent    Vocation  Part time employment    Engineer, materials    Leisure  taking care of cats, computer games and reading; unable to sit on stationary bike due to pain on anus      Cognition   Overall Cognitive Status  Within Functional Limits for tasks assessed      Observation/Other Assessments   Focus on Therapeutic Outcomes (FOTO)   therapist discrection is 50% limitation due to fecal and urinary leakage, pain with bowel movements, pain with sitting on bike      Sensation   Light Touch  Appears Intact      Posture/Postural Control   Posture/Postural Control  Postural limitations    Postural Limitations  Rounded Shoulders;Anterior pelvic tilt      ROM / Strength   AROM / PROM / Strength  AROM;PROM;Strength      AROM   Overall  AROM Comments  full lumbar ROM      Strength   Right Hip Flexion  4/5    Right Hip Extension  4+/5    Right Hip External Rotation   4/5    Right Hip Internal Rotation  4/5    Right Hip ABduction  3+/5    Right Hip ADduction  3+/5    Left Hip Flexion  4/5    Left Hip Extension  4+/5    Left Hip External Rotation  4/5    Left Hip Internal Rotation  4/5    Left Hip ABduction  3+/5    Left Hip ADduction  3+/5                Objective measurements completed on examination: See above findings.    Pelvic Floor Special Questions - 02/24/19 0001    Are you Pregnant or attempting pregnancy?  No    Prior Pregnancies  Yes    Number of Pregnancies  2    Number of Vaginal Deliveries  2    Episiotomy Performed  Yes    Currently Sexually Active  No   prior to treatment due to vaginal dryness   Urinary Leakage  Yes    Pad use  2    Activities that cause leaking  With strong urge    Fecal incontinence  Yes    External Perineal Exam  redness in the vulva area, clitorus is adhered to the clitoral hood, vaginal canal is stenosis    Perineal Body/Introitus   Elevated    External Palpation  tenderness located ont he left side of the perineal body    Pelvic Floor Internal Exam  Patient confirms identification and approves PT to assess pelvic floor and treatment    Exam Type  Vaginal;Rectal    Palpation  tenderness located on the vulva area, able to tolerate the q-tip being placed into the vaginal canal    Strength  --   unable to determine due to the vaginal and anal canal is sm              PT Education - 02/24/19 1401    Education  Details  educated patient on how to massage the outside of the vagina and anus, how to stretch the tissue around the clitorus to detach from the clitoral hood; ways to stretch her hips. pelvic floor meditation; information on vulva and vaginal canal moisturizers    Person(s) Educated  Patient    Methods  Explanation;Demonstration;Verbal  cues;Handout    Comprehension  Returned demonstration;Verbalized understanding       PT Short Term Goals - 02/24/19 1838      PT SHORT TERM GOAL #1   Title  independent with initial HEP    Time  4    Status  New    Target Date  03/24/19      PT SHORT TERM GOAL #2   Title  abiltiy to use the small dilator due to the relaxation of the vaginal tissue    Time  4    Period  Weeks    Status  New    Target Date  03/24/19      PT SHORT TERM GOAL #3   Title  burning with urination decreased >/= 25% due to using creams on the vaginal area to protect the skin    Time  4    Period  Weeks    Status  New    Target Date  03/24/19      PT SHORT TERM GOAL #4   Title  abilty to relax the anal canal to increase the diameter of the anal canal    Time  4    Period  Weeks    Status  New    Target Date  03/24/19        PT Long Term Goals - 02/24/19 1840      PT LONG TERM GOAL #1   Title  Pt will be ind with advanced HEP    Time  4    Period  Months    Status  New    Target Date  06/26/19      PT LONG TERM GOAL #2   Title  ability to use the larges dilator due to the expansion of the vaginal canal    Time  4    Period  Months    Status  New    Target Date  06/26/19      PT LONG TERM GOAL #3   Title  urinary leakage daily decreased >/= 60% due to improved pelvic floor strength and coordination    Time  4    Period  Months    Status  New    Target Date  06/26/19      PT LONG TERM GOAL #4   Title  fecal leakage decreased >/= 60% due to improve quality of anal contraction and strength    Time  4    Period  Months    Status  New    Target Date  06/26/19      PT LONG TERM GOAL #5   Title  bowel movement diameter is bigger than a nickle size    Time  4    Period  Months    Status  New    Target Date  06/26/19             Plan - 02/24/19 1826    Clinical Impression Statement  Patient is a 63 year old female with vaginal stenosis and possible anal stenosis. Patient  has urinary and fecal leakage. Patient has burning with urination. Patient has pain with  bowel movements that are the thickness of therapist pinky finger. Patient vulva area is red and tender. Patient has increased pain when a q-tip has been placed into the the vaginal canal. Patient is not ready to use the small dilator at this time. Patient has learned how to massage the perineum to prepare for the dilator. Patient is not able to ride her recumbent bike due to pain with sitting on the seat. Patient bilateral hip abduction and adduction is 3+/5 and rest is 4/5. Patient is s/p anal cancer and radiation and chemotherapy treatment ended on 12/15/18.  Patient will benefit from skilled therapy to improve pelvic floor health, guiding the patient to expand her vaginal canal to improve urine and fecal leakage     Personal Factors and Comorbidities  Age;Comorbidity 1;Comorbidity 2;Comorbidity 3+    Comorbidities  rectocele; osteopoross; s/p anal cancer with radiation and chemotherapy treatment    Examination-Activity Limitations  Continence;Sit;Toileting    Stability/Clinical Decision Making  Evolving/Moderate complexity    Clinical Decision Making  Moderate    Rehab Potential  Excellent    Clinical Impairments Affecting Rehab Potential  anal cancer    PT Frequency  1x / week    PT Duration  Other (comment)   4 months   PT Treatment/Interventions  Biofeedback;Neuromuscular re-education;Therapeutic exercise;Therapeutic activities;Patient/family education;Manual techniques;Passive range of motion;Energy conservation    PT Next Visit Plan  bathrooming technique; abdominal massage; fascial work to the vagina, hip stretches; bulging of the pelvic floor    Consulted and Agree with Plan of Care  Patient       Patient will benefit from skilled therapeutic intervention in order to improve the following deficits and impairments:  Decreased skin integrity, Increased fascial restricitons, Pain, Decreased coordination,  Decreased mobility, Decreased scar mobility, Increased muscle spasms, Decreased activity tolerance, Decreased endurance, Decreased strength  Visit Diagnosis: Cramp and spasm - Plan: PT plan of care cert/re-cert  Muscle weakness (generalized) - Plan: PT plan of care cert/re-cert  Unspecified lack of coordination - Plan: PT plan of care cert/re-cert  Anal cancer ([place]) - Plan: PT plan of care cert/re-cert     Problem List Patient Active Problem List   Diagnosis Date Noted  . Anal cancer ([place]) 10/14/2018  . Diabetes ([place]) 09/29/2018  . Genetic testing 03/19/2018  . Family history of breast cancer   . Family history of uterine cancer   . Osteopenia 07/06/2015  . Essential hypertension 07/06/2015  . Atrophic vaginitis 07/06/2015  . Lichen sclerosus et atrophicus of the vulva 07/06/2015    Earlie Counts, PT 02/24/19 6:48 PM Sent patient a text on her phone due to her not taking messages. I let her know she is to contact [facility] office to get an extra small dilator.  Earlie Counts, PT 02/26/19 3:05 PM    [facility] [address] [place], [place], [address] Phone: [phone]   Fax:  [phone]  Name: Angelica Wright MRN: [id number] Date of Birth: 02-03-1956

## 2019-02-25 ENCOUNTER — Encounter: Payer: Self-pay | Admitting: General Practice

## 2019-02-25 NOTE — Progress Notes (Signed)
[place] CSW Progress Notes  Pt requests information on support groups and possible Sales executive.  Provided information about various options, matched w patient who has had similar diagnosis/treatment plan.  Edwyna Shell, LCSW Clinical Social Worker Phone:  [phone]

## 2019-03-02 DIAGNOSIS — R7303 Prediabetes: Secondary | ICD-10-CM | POA: Diagnosis not present

## 2019-03-02 DIAGNOSIS — I10 Essential (primary) hypertension: Secondary | ICD-10-CM | POA: Diagnosis not present

## 2019-03-02 DIAGNOSIS — E559 Vitamin D deficiency, unspecified: Secondary | ICD-10-CM | POA: Diagnosis not present

## 2019-03-06 ENCOUNTER — Encounter: Payer: Self-pay | Admitting: Hematology

## 2019-03-06 ENCOUNTER — Telehealth: Payer: Self-pay

## 2019-03-06 NOTE — Telephone Encounter (Signed)
Mailed letter regarding jury duty to patient's home today.

## 2019-03-10 ENCOUNTER — Encounter: Payer: 59 | Admitting: Physical Therapy

## 2019-03-17 ENCOUNTER — Encounter: Payer: Self-pay | Admitting: Hematology

## 2019-03-19 ENCOUNTER — Ambulatory Visit: Payer: 59 | Admitting: Physical Therapy

## 2019-03-20 ENCOUNTER — Telehealth: Payer: Self-pay | Admitting: Obstetrics and Gynecology

## 2019-03-20 NOTE — Telephone Encounter (Signed)
VOID

## 2019-03-27 ENCOUNTER — Telehealth: Payer: Self-pay | Admitting: Obstetrics and Gynecology

## 2019-03-27 NOTE — Telephone Encounter (Signed)
This patient is questing if she needs to return for an aex at this time due to her recent cancer treatments? She would like Dr.Jertson's opinion on this issue.

## 2019-03-27 NOTE — Telephone Encounter (Signed)
Returned patient's call. Left message to call office.

## 2019-03-31 NOTE — Telephone Encounter (Signed)
I would definitely hold off on her annual until she is feeling better.  She should get checked at some point because of her h/o lichen sclerosis. She can defer any part of the exam if needed. Why don't we schedule her 6 months out and she can cancel it if she isn't feeling up for it.

## 2019-03-31 NOTE — Telephone Encounter (Signed)
Spoke with patient and she states her last round of chemo for anal cancer was 1/end/2020. She's concerned about coming in for AEX for several reasons.   #1 COVID  #2 The radiation for the anal cancer has left scar tissue rectally and vaginally. She is working with a physical therapy, but she is extremely concerned about a vaginal exam and/or pap smear at this time.  Patient is really wanting to know if she could postpone exam for now? Her pap 01-16-18 Neg:Neg HR HPV.Has PET scan and f/u with Onc.04/06/2019. Will discuss with Dr.Jertson and call her back with her recommendations. Per patient if she does not answer it is okay to leave detailed message on her voicemail.

## 2019-04-01 NOTE — Telephone Encounter (Signed)
Spoke with patient, advised as seen below per Dr. Talbert Nan. Patient agreeable to plan, AEX scheduled for 11/11 at 3:30pm with Dr. Talbert Nan.   Routing to provider for final review. Patient is agreeable to disposition. Will close encounter.

## 2019-04-02 ENCOUNTER — Encounter: Payer: Self-pay | Admitting: Physical Therapy

## 2019-04-02 ENCOUNTER — Ambulatory Visit: Payer: 59 | Attending: Hematology | Admitting: Physical Therapy

## 2019-04-02 ENCOUNTER — Other Ambulatory Visit: Payer: Self-pay

## 2019-04-02 DIAGNOSIS — M6281 Muscle weakness (generalized): Secondary | ICD-10-CM | POA: Diagnosis present

## 2019-04-02 DIAGNOSIS — C210 Malignant neoplasm of anus, unspecified: Secondary | ICD-10-CM | POA: Insufficient documentation

## 2019-04-02 DIAGNOSIS — R279 Unspecified lack of coordination: Secondary | ICD-10-CM | POA: Diagnosis present

## 2019-04-02 DIAGNOSIS — R252 Cramp and spasm: Secondary | ICD-10-CM | POA: Diagnosis present

## 2019-04-02 NOTE — Therapy (Signed)
[facility] [address] [place], [place], [address] Phone: [phone]   Fax:  [phone]  Physical Therapy Treatment  Patient Details  Name: Angelica Wright MRN: [id number] Date of Birth: Jun 30, 1956 Referring Provider (PT): Worthy Flank [facility]   Encounter Date: 04/02/2019  PT End of Session - 04/02/19 1441    Visit Number  2    Date for PT Re-Evaluation  06/26/19    Authorization Type  UHC     Authorization - Visit Number  2    Authorization - Number of Visits  22    PT Start Time  9233    PT Stop Time  1435    PT Time Calculation (min)  50 min    Activity Tolerance  Patient tolerated treatment well    Behavior During Therapy  [facility] for tasks assessed/performed       Past Medical History:  Diagnosis Date  . Hyperparathyroidism ([place])    resolved after surgery  . Hypertension   . Osteoporosis    of the spine  . Rectocele   . Squamous cell cancer, anus (HCC)   . TMJ syndrome   . Type 2 Diabetes ([place]) 11/2013   controlled with diet/exer    Past Surgical History:  Procedure Laterality Date  . LYMPH NODE BIOPSY    . PARATHYROIDECTOMY  2000  . THERAPEUTIC ABORTION     x 2  . VULVA /PERINEUM BIOPSY  2012    There were no vitals filed for this visit.  Subjective Assessment - 04/02/19 1344    Subjective  I am using the q-tip and unable to get in very far. This friday I get my PET scan. I am working 3 days a week now. Today is a rough day and my bowel movements are not the same. I did not get the message about the dilator. I do better with my home number to be reached. Burning with urination is 100% better.     Patient Stated Goals  strenthen the pelvic floor; reduce leakage    Currently in Pain?  Yes    Pain Score  7     Pain Location  Rectum    Pain Orientation  Mid    Pain Descriptors / Indicators  Sharp    Pain Type  Acute pain    Pain Onset  More than a month ago    Aggravating Factors   during  bowel movement;     Pain Relieving Factors  using the desert harvest with relevum for the pain    Multiple Pain Sites  No                       [facility] Adult PT Treatment/Exercise - 04/02/19 0001      Self-Care   Self-Care  Other Self-Care Comments    Other Self-Care Comments   bowel health instruction; instruction on how to fill out the bowel diary to see what increases the bowel movements      Therapeutic Activites    Therapeutic Activities  Other Therapeutic Activities    Other Therapeutic Activities  instruction on correct toilteting technique to relax the pelvic floor and push out the stool      Neuro Re-ed    Neuro Re-ed Details   education on bulging the pelvic floor to stretch out the muscles and elongate using a mirror, diaphragmatic breathing to bulge the pelvic floor and calm her nervous system down so she  does not feel like she has to have a bowel movement      Lumbar Exercises: Stretches   Active Hamstring Stretch  Right;Left;1 rep;30 seconds   sitting   Press Ups  3 reps;10 seconds    Piriformis Stretch  Right;Left;1 rep;30 seconds   sitting   Other Lumbar Stretch Exercise  supine butterfly stretch      Manual Therapy   Manual Therapy  Soft tissue mobilization;Myofascial release    Soft tissue mobilization  abdominal massage to improve peristalic movement of the intestines to reduce bowel movements    Myofascial Release  lower abdominal release             PT Education - 04/02/19 1432    Education Details  Access Code: [email or web address]    Person(s) Educated  Patient    Methods  Explanation;Demonstration;Verbal cues;Handout    Comprehension  Verbalized understanding;Returned demonstration       PT Short Term Goals - 02/24/19 1838      PT SHORT TERM GOAL #1   Title  independent with initial HEP    Time  4    Status  New    Target Date  03/24/19      PT SHORT TERM GOAL #2   Title  abiltiy to use the small dilator due to the relaxation of the  vaginal tissue    Time  4    Period  Weeks    Status  New    Target Date  03/24/19      PT SHORT TERM GOAL #3   Title  burning with urination decreased >/= 25% due to using creams on the vaginal area to protect the skin    Time  4    Period  Weeks    Status  New    Target Date  03/24/19      PT SHORT TERM GOAL #4   Title  abilty to relax the anal canal to increase the diameter of the anal canal    Time  4    Period  Weeks    Status  New    Target Date  03/24/19        PT Long Term Goals - 02/24/19 1840      PT LONG TERM GOAL #1   Title  Pt will be ind with advanced HEP    Time  4    Period  Months    Status  New    Target Date  06/26/19      PT LONG TERM GOAL #2   Title  ability to use the larges dilator due to the expansion of the vaginal canal    Time  4    Period  Months    Status  New    Target Date  06/26/19      PT LONG TERM GOAL #3   Title  urinary leakage daily decreased >/= 60% due to improved pelvic floor strength and coordination    Time  4    Period  Months    Status  New    Target Date  06/26/19      PT LONG TERM GOAL #4   Title  fecal leakage decreased >/= 60% due to improve quality of anal contraction and strength    Time  4    Period  Months    Status  New    Target Date  06/26/19      PT LONG TERM GOAL #5   Title  bowel movement diameter is bigger than a nickle size    Time  4    Period  Months    Status  New    Target Date  06/26/19            Plan - 04/02/19 1443    Clinical Impression Statement  Patient has not gotten the x-small dilator yet but will pick it up on Monday. Patient is able to place the Q-tip into the vaginal 2 inches but then will have pain. Patient will use the [facility] to help with the internal pain. Patient did not want the internal work due to her bowls feeling active. Patient will benefit from skilled therapy to improve pelvic floor health, guiding the patient to expand her vaginal canal to  improve urine and fecal leakage.     Personal Factors and Comorbidities  Age;Comorbidity 1;Comorbidity 2;Comorbidity 3+    Comorbidities  rectocele; osteopoross; s/p anal cancer with radiation and chemotherapy treatment    Examination-Activity Limitations  Continence;Sit;Toileting    Stability/Clinical Decision Making  Evolving/Moderate complexity    Rehab Potential  Excellent    Clinical Impairments Affecting Rehab Potential  anal cancer    PT Frequency  1x / week    PT Duration  Other (comment)   4 monhts   PT Treatment/Interventions  Biofeedback;Neuromuscular re-education;Therapeutic exercise;Therapeutic activities;Patient/family education;Manual techniques;Passive range of motion;Energy conservation    PT Next Visit Plan  abdominal massage; fascial work to the vagina, hip stretches    PT Home Exercise Plan  Access Code: [email or web address]    Consulted and Agree with Plan of Care  Patient       Patient will benefit from skilled therapeutic intervention in order to improve the following deficits and impairments:  Decreased skin integrity, Increased fascial restricitons, Pain, Decreased coordination, Decreased mobility, Decreased scar mobility, Increased muscle spasms, Decreased activity tolerance, Decreased endurance, Decreased strength  Visit Diagnosis: Cramp and spasm  Muscle weakness (generalized)  Unspecified lack of coordination  Anal cancer [facility])     Problem List Patient Active Problem List   Diagnosis Date Noted  . Anal cancer ([place]) 10/14/2018  . Diabetes ([place]) 09/29/2018  . Genetic testing 03/19/2018  . Family history of breast cancer   . Family history of uterine cancer   . Osteopenia 07/06/2015  . Essential hypertension 07/06/2015  . Atrophic vaginitis 07/06/2015  . Lichen sclerosus et atrophicus of the vulva 07/06/2015    Earlie Counts, PT 04/02/19 2:47 PM   [facility] [address] [place], [place],  [address] Phone: [phone]   Fax:  [phone]  Name: Angelica Wright MRN: [id number] Date of Birth: 09-Mar-1956

## 2019-04-02 NOTE — Patient Instructions (Addendum)
Call Carla at the cancer center to get the x-small dilator.   Introduction to Bowel Health Diet and daily habits can help you predict when your bowels will move on a regular basis.  The consistency and quantity of the stool is usually more important than the frequency.  The goal is to have a regular bowel movement that is soft but formed.   Tips on Emptying Regularly . Eat breakfast.  Usually the best time of day for a bowel movement will be a half hour to an hour after eating.  These times are best because the body uses the gastrocolic reflex, a stimulation of bowel motion that occurs with eating, to help produce a bowel movement.  For some people even a simple hot drink in the morning can help the reflex action begin. . Eat all your meals at a predictable time each day.  The bowel functions best when food is introduced at the same regular intervals. . The amount of food eaten at a given time of day should be about the same size from day to day.  The bowel functions best when food is introduced in similar quantities from day to day. It is fine to have a small breakfast and a large lunch, or vice versa, just be consistent. . Eat two servings of fruit or vegetables and at least one serving of a complex carbohydrates (whole grains such as brown rice, bran, whole wheat bread, or oatmeal) at each meal. . Drink plenty of water-ideally eight glasses a day.  Be sure to increase your water intake if you are increasing fiber into your diet.  Maintain Healthy Habits . Exercise daily.  You may exercise at any time of day, but you may find that bowel function is helped most if the exercise is at a consistent time each day. . Make sure that you are not rushed and have convenient access to a bathroom at your selected time to empty your bowels.  1 time per day go on the exercise bike  Fill out bowel diary for 1 day at work and 1 day home.   Toileting Techniques for Bowel Movements (Defecation) Using your belly  (abdomen) and pelvic floor muscles to have a bowel movement is usually instinctive.  Sometimes people can have problems with these muscles and have to relearn proper defecation (emptying) techniques.  If you have weakness in your muscles, organs that are falling out, decreased sensation in your pelvis, or ignore your urge to go, you may find yourself straining to have a bowel movement.  You are straining if you are: . holding your breath or taking in a huge gulp of air and holding it  . keeping your lips and jaw tensed and closed tightly . turning red in the face because of excessive pushing or forcing . developing or worsening your  hemorrhoids . getting faint while pushing . not emptying completely and have to defecate many times a day  If you are straining, you are actually making it harder for yourself to have a bowel movement.  Many people find they are pulling up with the pelvic floor muscles and closing off instead of opening the anus. Due to lack pelvic floor relaxation and coordination the abdominal muscles, one has to work harder to push the feces out.  Many people have never been taught how to defecate efficiently and effectively.  Notice what happens to your body when you are having a bowel movement.  While you are sitting on the toilet  pay attention to the following areas: . Jaw and mouth position . Angle of your hips   . Whether your feet touch the ground or not . Arm placement  . Spine position . Waist . Belly tension . Anus (opening of the anal canal)  An Evacuation/Defecation Plan   Here are the 4 basic points:  1. Lean forward enough for your elbows to rest on your knees 2. Support your feet on the floor or use a low stool if your feet don't touch the floor  3. Push out your belly as if you have swallowed a beach ball-you should feel a widening of your waist 4. Open and relax your pelvic floor muscles, rather than tightening around the anus       The following  conditions my require modifications to your toileting posture:  . If you have had surgery in the past that limits your back, hip, pelvic, knee or ankle flexibility . Constipation   Your healthcare practitioner may make the following additional suggestions and adjustments:  1) Sit on the toilet  a) Make sure your feet are supported. b) Notice your hip angle and spine position-most people find it effective to lean forward or raise their knees, which can help the muscles around the anus to relax  c) When you lean forward, place your forearms on your thighs for support  2) Relax suggestions a) Breath deeply in through your nose and out slowly through your mouth as if you are smelling the flowers and blowing out the candles. b) To become aware of how to relax your muscles, contracting and releasing muscles can be helpful.  Pull your pelvic floor muscles in tightly by using the image of holding back gas, or closing around the anus (visualize making a circle smaller) and lifting the anus up and in.  Then release the muscles and your anus should drop down and feel open. Repeat 5 times ending with the feeling of relaxation. c) Keep your pelvic floor muscles relaxed; let your belly bulge out. d) The digestive tract starts at the mouth and ends at the anal opening, so be sure to relax both ends of the tube.  Place your tongue on the roof of your mouth with your teeth separated.  This helps relax your mouth and will help to relax the anus at the same time.  3) Empty (defecation) a) Keep your pelvic floor and sphincter relaxed, then bulge your anal muscles.  Make the anal opening wide.  b) Stick your belly out as if you have swallowed a beach ball. c) Make your belly wall hard using your belly muscles while continuing to breathe. Doing this makes it easier to open your anus. d) Breath out and give a grunt (or try using other sounds such as ahhhh, shhhhh, ohhhh or grrrrrrr).  4) Finish a) As you finish  your bowel movement, pull the pelvic floor muscles up and in.  This will leave your anus in the proper place rather than remaining pushed out and down. If you leave your anus pushed out and down, it will start to feel as though that is normal and give you incorrect signals about needing to have a bowel movement.  When using the [facility] Reveleum  Put up into the vagina 1 time daily.  Daily use your finger to make circular massage to the vulva   Sitting    Sit comfortably. Allow body's muscles to relax. Place hands on belly. Inhale slowly and deeply for _3__  seconds, so hands move out. Then take _3__ seconds to exhale. Repeat _5__ times. Do _1__ times a day.Do while bulging the pelvic floor.  Sit in a reclined position.  Copyright  VHI. All rights reserved.   About Abdominal Massage  Abdominal massage, also called external colon massage, is a self-treatment circular massage technique that can reduce and eliminate gas and ease constipation. The colon naturally contracts in waves in a clockwise direction starting from inside the right hip, moving up toward the ribs, across the belly, and down inside the left hip.  When you perform circular abdominal massage, you help stimulate your colon's normal wave pattern of movement called peristalsis.  It is most beneficial when done after eating.  Positioning You can practice abdominal massage with oil while lying down, or in the shower with soap.  Some people find that it is just as effective to do the massage through clothing while sitting or standing.  How to Massage Start by placing your finger tips or knuckles on your right side, just inside your hip bone.  . Make small circular movements while you move upward toward your rib cage.   . Once you reach the bottom right side of your rib cage, take your circular movements across to the left side of the bottom of your rib cage.  . Next, move downward until you reach the inside of your left hip  bone.  This is the path your feces travel in your colon. . Continue to perform your abdominal massage in this pattern for 10 minutes each day.     You can apply as much pressure as is comfortable in your massage.  Start gently and build pressure as you continue to practice.  Notice any areas of pain as you massage; areas of slight pain may be relieved as you massage, but if you have areas of significant or intense pain, consult with your healthcare provider.  Other Considerations . General physical activity including bending and stretching can have a beneficial massage-like effect on the colon.  Deep breathing can also stimulate the colon because breathing deeply activates the same nervous system that supplies the colon.   . Abdominal massage should always be used in combination with a bowel-conscious diet that is high in the proper type of fiber for you, fluids (primarily water), and a regular exercise program. [facility] [address] [place], [place] [address] Phone # [phone] Fax [phone] Access Code: [email or web address]  URL: [email or web address]  Date: 04/02/2019  Prepared by: Earlie Counts   Exercises Supine Butterfly Groin Stretch - 1 reps - 1 sets - 1 min hold - 1x daily - 7x weekly Prone Press Up on Elbows - 5 reps - 1 sets - 15 sec 1 min hold - 1x daily - 7x weekly Seated Piriformis Stretch with Trunk Bend - 1 reps - 1 sets - 30 sec hold - 1x daily - 7x weekly Seated Hamstring Stretch - 1 reps - 1 sets - 30 sec hold - 1x daily - 7x weekly

## 2019-04-03 ENCOUNTER — Encounter: Payer: Self-pay | Admitting: Hematology

## 2019-04-06 ENCOUNTER — Encounter (HOSPITAL_COMMUNITY): Payer: Self-pay

## 2019-04-06 ENCOUNTER — Encounter (HOSPITAL_COMMUNITY): Payer: 59

## 2019-04-06 NOTE — Telephone Encounter (Addendum)
10:58 am: Voicemail message left for Managed Care team lead Gaspar Bidding to advised on this imaging authorization. Noted Brandi Harvell to cover in-basket.

## 2019-04-09 ENCOUNTER — Telehealth: Payer: Self-pay

## 2019-04-09 NOTE — Telephone Encounter (Signed)
Spoke with patient regarding PET scan approval, scheduled for Tuesday 6/9 at 1:00 pm to arrive at [facility] by 12:30, NPO 6 hours before, no carbs for 12 hours before.  She is not on any diabetic medication.

## 2019-04-10 ENCOUNTER — Ambulatory Visit: Payer: 59 | Admitting: Hematology

## 2019-04-10 ENCOUNTER — Other Ambulatory Visit: Payer: 59

## 2019-04-14 ENCOUNTER — Ambulatory Visit (HOSPITAL_COMMUNITY)
Admission: RE | Admit: 2019-04-14 | Discharge: 2019-04-14 | Disposition: A | Discharge status: Home / Self Care | Payer: 59 | Source: Ambulatory Visit | Attending: Hematology | Admitting: Hematology

## 2019-04-14 ENCOUNTER — Other Ambulatory Visit: Payer: Self-pay

## 2019-04-14 DIAGNOSIS — C210 Malignant neoplasm of anus, unspecified: Secondary | ICD-10-CM | POA: Insufficient documentation

## 2019-04-14 LAB — GLUCOSE, CAPILLARY: Glucose-Capillary: 90 mg/dL (ref 70–99)

## 2019-04-14 MED ORDER — FLUDEOXYGLUCOSE F - 18 (FDG) INJECTION
7.9400 | Freq: Once | INTRAVENOUS | Status: AC | PRN
  Administered 2019-04-14: 7.94 via INTRAVENOUS

## 2019-04-15 NOTE — Progress Notes (Signed)
[place]   [phone] [phone]   Clinic Follow up Note   Patient Care Team: Shirline Frees, MD as PCP - General (Family Medicine)  Date of Service:  04/17/2019  CHIEF COMPLAINT:  F/u of anal cancer  SUMMARY OF ONCOLOGIC HISTORY: Oncology History  Anal cancer ([place])  03/19/2018 Genetic Testing   Genetic testing performed through [facility]'s Common Hereditary Cancers Panel reported out on 03/10/2018 showed no pathogenic mutations. The Common Hereditary Cancer Panel offered by [facility] includes sequencing and/or deletion duplication testing of the following 47 genes: APC, ATM, AXIN2, BARD1, BMPR1A, BRCA1, BRCA2, BRIP1, CDH1, CDKN2A (p14ARF), CDKN2A (p16INK4a), CKD4, CHEK2, CTNNA1, DICER1, EPCAM (Deletion/duplication testing only), GREM1 (promoter region deletion/duplication testing only), KIT, MEN1, MLH1, MSH2, MSH3, MSH6, MUTYH, NBN, NF1, NHTL1, PALB2, PDGFRA, PMS2, POLD1, POLE, PTEN, RAD50, RAD51C, RAD51D, SDHB, SDHC, SDHD, SMAD4, SMARCA4. STK11, TP53, TSC1, TSC2, and VHL.  The following genes were evaluated for sequence changes only: SDHA and HOXB13 c.251G>A variant only.  A variant of uncertain significance (VUS) in a gene called PMS2 was also noted. c.1399G>A (p.Val467Ile)   08/14/2018 Imaging   MRI PELVIS W WO Contrast 08/14/18 IMPRESSION: 1. Centrally necrotic 1.8 cm enlarged lymph node versus nonspecific soft tissue mass in the left hemipelvis adjacent to the posterior pelvic sidewall. Further evaluation with biopsy and/or PET-CT is recommended.   09/17/2018 Pathology Results   Diagnosis 09/17/18 Lymph node, needle/core biopsy, left pelvic - SQUAMOUS CELL CARCINOMA. - SEE COMMENT. Microscopic Comment   10/09/2018 PET scan   PET 10/09/18  IMPRESSION: 1. 17 mm left pelvic sidewall lesion is markedly hypermetabolic compatible with known malignancy. 2. No additional definite hypermetabolic disease identified in the neck, chest, abdomen, or  pelvis. 3. Relatively diffuse FDG accumulation in the colon without focal increased uptake above background colonic levels in the region of the anus. 4. Focal FDG accumulation identified along the anterior left femoral neck and in the lumbosacral junction. No underlying bone lesions evident at these locations on CT images. Indeterminate finding although metastatic disease cannot be excluded.    10/13/2018 Initial Biopsy   Diagnosis 10/13/18 Anus, biopsy - SQUAMOUS CELL CARCINOMA. - SEE COMMENT.   10/14/2018 Initial Diagnosis   Anal cancer ([place])   10/14/2018 Cancer Staging   Staging form: Anus, AJCC 8th Edition - Clinical stage from 10/14/2018: Stage IIIA (cT2, cN1b, cM0) - Signed by Truitt Merle, MD on 10/14/2018   11/03/2018 - 12/15/2018 Radiation Therapy   Concurrent chemoRt with Dr. Lisbeth Renshaw for 5-6 weeks starting 11/03/18. Plans to complete on 12/15/18   11/03/2018 - 12/15/2018 Chemotherapy   Concurrent chemoRt with Mitomycin and 5-FU on week 1 and week 5 starting 11/03/18. Week 5 chemo on 12/01/18   04/14/2019 PET scan   PET 04/14/19  IMPRESSION: 1. The hypermetabolic 17 mm left pelvic sidewall lesion seen on the previous study has resolved in the interval. No new or progressive hypermetabolic disease in the neck, chest, abdomen, or pelvis on today's study. 2. Hepatic steatosis. 3. Colonic diverticulosis without diverticulitis.        CURRENT THERAPY:  Surveillance  INTERVAL HISTORY:  Angelica Wright is here for a follow up of anal cancer. She presents to the clinic alone. She called her husband to be included in the visit. She notes she is still having diarrhea 3-5 times a day. When she gets closer to 5 she uses imodium so she can avoid constipation. She at times has stool incontinence 1-2 times a week. She is currently doing PT  for pelvic exercises. She notes her appetite and eating is adequate. Her energy is adequate and she feels she is 80-90% back at baseline. She notes  rectal pain with BMs and for 1 hour afterward.     REVIEW OF SYSTEMS:   Constitutional: Denies fevers, chills or abnormal weight loss Eyes: Denies blurriness of vision Ears, nose, mouth, throat, and face: Denies mucositis or sore throat Respiratory: Denies cough, dyspnea or wheezes Cardiovascular: Denies palpitation, chest discomfort or lower extremity swelling Gastrointestinal:  Denies nausea, heartburn (+) diarrhea with external buttock irritations (+) stool incontinence (+) hemorrhoids (+) mild rectal pain  Skin: Denies abnormal skin rashes Lymphatics: Denies new lymphadenopathy or easy bruising Neurological:Denies numbness, tingling or new weaknesses Behavioral/Psych: Mood is stable, no new changes  All other systems were reviewed with the patient and are negative.  MEDICAL HISTORY:  Past Medical History:  Diagnosis Date  . Hyperparathyroidism ([place])    resolved after surgery  . Hypertension   . Osteoporosis    of the spine  . Rectocele   . Squamous cell cancer, anus ([facility])   . TMJ syndrome   . Type 2 Diabetes ([place]) 11/2013   controlled with diet/exer    SURGICAL HISTORY: Past Surgical History:  Procedure Laterality Date  . LYMPH NODE BIOPSY    . PARATHYROIDECTOMY  2000  . THERAPEUTIC ABORTION     x 2  . VULVA /PERINEUM BIOPSY  2012    I have reviewed the social history and family history with the patient and they are unchanged from previous note.  ALLERGIES:  is allergic to sulfa antibiotics.  MEDICATIONS:  Current Outpatient Medications  Medication Sig Dispense Refill  . aspirin 81 MG tablet Take 81 mg by mouth every evening.     Marland Kitchen atenolol (TENORMIN) 50 MG tablet Take 50 mg by mouth every evening.     . calcium carbonate (TUMS - DOSED IN MG ELEMENTAL CALCIUM) 500 MG chewable tablet Chew 1 tablet by mouth daily as needed for indigestion or heartburn.    . cholecalciferol (VITAMIN D3) 25 MCG (1000 UT) tablet Take 2,000 Units by mouth every evening.    .  clobetasol ointment (TEMOVATE) 0.05 % Apply a pea sized amount topically BID x 1-2 weeks prn flare 30 g 1  . Coenzyme Q10 (CO Q 10) 100 MG CAPS Take 100 mg by mouth daily.     . diphenoxylate-atropine (LOMOTIL) 2.5-0.025 MG tablet Take 1-2 tablets by mouth 4 (four) times daily as needed for diarrhea or loose stools. 30 tablet 1  . hydrochlorothiazide (HYDRODIURIL) 12.5 MG tablet Take 12.5 mg by mouth daily.     . hydrocortisone (ANUSOL-HC) 25 MG suppository Place 1 suppository (25 mg total) rectally 2 (two) times daily. 12 suppository 1  . ibuprofen (ADVIL,MOTRIN) 100 MG tablet Take 400-600 mg by mouth 2 (two) times daily as needed for pain.    . Magnesium 250 MG TABS Take 250 mg by mouth every evening.    . Multiple Vitamin (MULTIVITAMIN WITH MINERALS) TABS tablet Take 1 tablet by mouth daily.    . silver sulfADIAZINE (SILVADENE) 1 % cream Apply 1 application topically daily. 50 g 0  . SUPER B COMPLEX/C PO Take 1 tablet by mouth daily.    . valACYclovir (VALTREX) 500 MG tablet Take one tab po BID for 3 days with flares. 30 tablet 1  . vitamin C (ASCORBIC ACID) 500 MG tablet Take 500 mg by mouth every evening.     No current facility-administered medications  for this visit.     PHYSICAL EXAMINATION: ECOG PERFORMANCE STATUS: 0 - Asymptomatic  Vitals:   04/17/19 1303  BP: 140/66  Pulse: 74  Resp: 17  Temp: 98.9 F (37.2 C)  SpO2: 100%   Filed Weights   04/17/19 1303  Weight: 143 lb 1.6 oz (64.9 kg)    GENERAL:alert, no distress and comfortable SKIN: skin color, texture, turgor are normal, no rashes or significant lesions EYES: normal, Conjunctiva are pink and non-injected, sclera clear  NECK: supple, thyroid normal size, non-tender, without nodularity LYMPH:  no palpable lymphadenopathy in the cervical, axillary, inguinal  LUNGS: clear to auscultation and percussion with normal breathing effort HEART: regular rate & rhythm and no murmurs and no lower extremity edema  ABDOMEN:abdomen soft, non-tender and normal bowel sounds Musculoskeletal:no cyanosis of digits and no clubbing  NEURO: alert & oriented x 3 with fluent speech, no focal motor/sensory deficits RECTAL EXAM: (+) external hemorrhoids, healed (+) No palpable mass, no blood on glove   LABORATORY DATA:  I have reviewed the data as listed CBC Latest Ref Rng & Units 04/17/2019 02/16/2019 12/29/2018  WBC 4.0 - 10.5 K/uL 5.2 5.4 2.8(L)  Hemoglobin 12.0 - 15.0 g/dL 12.9 11.3(L) 11.0(L)  Hematocrit 36.0 - 46.0 % 38.4 34.8(L) 33.6(L)  Platelets 150 - 400 K/uL 261 239 246     CMP Latest Ref Rng & Units 04/17/2019 02/16/2019 12/29/2018  Glucose 70 - 99 mg/dL 91 108(H) 104(H)  BUN 8 - 23 mg/dL _0 Creatinine 0.44 - 1.00 mg/dL 0.73 0.79 0.73  Sodium 135 - 145 mmol/L 141 142 143  Potassium 3.5 - 5.1 mmol/L 4.5 4.4 3.6  Chloride 98 - 111 mmol/L 108 108 108  CO2 22 - 32 mmol/L _1 Calcium 8.9 - 10.3 mg/dL 9.7 9.2 8.9  Total Protein 6.5 - 8.1 g/dL 7.5 7.0 6.4(L)  Total Bilirubin 0.3 - 1.2 mg/dL 0.4 0.5 0.3  Alkaline Phos 38 - 126 U/L 87 81 54  AST 15 - 41 U/L 25 22 32  ALT 0 - 44 U/L 29 19 34      RADIOGRAPHIC STUDIES: I have personally reviewed the radiological images as listed and agreed with the findings in the report. No results found.   ASSESSMENT & PLAN:  Angelica Wright is a 63 y.o. female with   1.Anal Cancer,withpositiveleft pelvic lymph node, cT2N1bM0, stage IIIA -She was diagnosed in 09/2018, and completed concurrent chemoRT on 12/25/2018. She has recovered well.  -Her 03/2018 genetic testing was negative except for VUS of gene PMS2. -I personally reviewed the PET scan from 04/14/19 images with pt together, which showed the left pelvic sidewalls node has resolved, no new or progressive hypermetabolic disease anywhere is the body. There is incidental findings of colon diverticulosis and hepatic stenosis. Overall she has had complete radiographic response. Her rectal exam was  also negative today.  -I discussed even with complete response achieved, there is still risk of cancer recurrence in the future. I discussed the surveillance plan, which is a physical including rectal exam and lab test (including CBC, CMP) every 3 months for the first 2 years, then every 4-6 months and surveillance CT scan every 6-12 month for up to 5 year.  -She is clinically doing well. Still has mild diarrhea, mostly controlled. Labs reviewed, CBC and CMP WNL except MCV 102.4. Her physical exam was unremarkable.  -She is fine to use probiotics and supplements.  -F/u in 6 months, she will see Dr.  Thomas in 3 months   2. HTN and DM -Continue medication, not on diabetic medication -Managed by her PCP -Well controlled    3.Rectal Pain and Diarrhea secondary to chemoradiation -improved overtime  -she still has mild rectal pain with BM and for 1 hour afterward. Has skin irritation from diarrhea  -She is undergoing PT for pelvic floor exercise  -She will continue to use low dose imodium as needed to prevent diarrhea and constipation. She has lomotil for severe diarrhea.  -She will continue oral magnetism  4. Mild anemia  -Secondary chemotherapy, continue monitoring -I encourage her to take prenatal vitamins -Currently resolved. MCV at 102.4 today   PLAN: -PET scan reviewed, she has had complete response  -Lab and f.u in 6 months, she will see Dr. Marcello Moores in 3 months     No problem-specific Assessment & Plan notes found for this encounter.   No orders of the defined types were placed in this encounter.  All questions were answered. The patient knows to call the clinic with any problems, questions or concerns. No barriers to learning was detected. I spent 20 minutes counseling the patient face to face. The total time spent in the appointment was 25 minutes and more than 50% was on counseling and review of test results     Truitt Merle, MD 04/17/2019   I, Joslyn Devon, am  acting as scribe for Truitt Merle, MD.   I have reviewed the above documentation for accuracy and completeness, and I agree with the above.

## 2019-04-16 ENCOUNTER — Encounter: Payer: 59 | Admitting: Physical Therapy

## 2019-04-17 ENCOUNTER — Other Ambulatory Visit: Payer: Self-pay

## 2019-04-17 ENCOUNTER — Inpatient Hospital Stay: Payer: 59 | Attending: Radiation Oncology | Admitting: Hematology

## 2019-04-17 ENCOUNTER — Encounter: Payer: Self-pay | Admitting: Hematology

## 2019-04-17 ENCOUNTER — Inpatient Hospital Stay: Payer: 59

## 2019-04-17 VITALS — BP 140/66 | HR 74 | Temp 98.9°F | Resp 17 | Ht 61.0 in | Wt 143.1 lb

## 2019-04-17 DIAGNOSIS — E119 Type 2 diabetes mellitus without complications: Secondary | ICD-10-CM | POA: Insufficient documentation

## 2019-04-17 DIAGNOSIS — E1169 Type 2 diabetes mellitus with other specified complication: Secondary | ICD-10-CM

## 2019-04-17 DIAGNOSIS — C210 Malignant neoplasm of anus, unspecified: Secondary | ICD-10-CM

## 2019-04-17 DIAGNOSIS — I10 Essential (primary) hypertension: Secondary | ICD-10-CM | POA: Insufficient documentation

## 2019-04-17 DIAGNOSIS — D649 Anemia, unspecified: Secondary | ICD-10-CM | POA: Insufficient documentation

## 2019-04-17 DIAGNOSIS — Z85048 Personal history of other malignant neoplasm of rectum, rectosigmoid junction, and anus: Secondary | ICD-10-CM | POA: Diagnosis not present

## 2019-04-17 LAB — CMP (CANCER CENTER ONLY)
ALT: 29 U/L (ref 0–44)
AST: 25 U/L (ref 15–41)
Albumin: 4.2 g/dL (ref 3.5–5.0)
Alkaline Phosphatase: 87 U/L (ref 38–126)
Anion gap: 9 (ref 5–15)
BUN: 14 mg/dL (ref 8–23)
CO2: 24 mmol/L (ref 22–32)
Calcium: 9.7 mg/dL (ref 8.9–10.3)
Chloride: 108 mmol/L (ref 98–111)
Creatinine: 0.73 mg/dL (ref 0.44–1.00)
GFR, Est AFR Am: >60 mL/min (ref >60)
GFR, Estimated: >60 mL/min (ref >60)
Glucose, Bld: 91 mg/dL (ref 70–99)
Potassium: 4.5 mmol/L (ref 3.5–5.1)
Sodium: 141 mmol/L (ref 135–145)
Total Bilirubin: 0.4 mg/dL (ref 0.3–1.2)
Total Protein: 7.5 g/dL (ref 6.5–8.1)

## 2019-04-17 LAB — CBC WITH DIFFERENTIAL (CANCER CENTER ONLY)
Abs Immature Granulocytes: 0.01 10*3/uL (ref 0.00–0.07)
Basophils Absolute: 0 10*3/uL (ref 0.0–0.1)
Basophils Relative: 0 %
Eosinophils Absolute: 0 10*3/uL (ref 0.0–0.5)
Eosinophils Relative: 1 %
HCT: 38.4 % (ref 36.0–46.0)
Hemoglobin: 12.9 g/dL (ref 12.0–15.0)
Immature Granulocytes: 0 %
Lymphocytes Relative: 13 %
Lymphs Abs: 0.7 10*3/uL (ref 0.7–4.0)
MCH: 34.4 pg — ABNORMAL HIGH (ref 26.0–34.0)
MCHC: 33.6 g/dL (ref 30.0–36.0)
MCV: 102.4 fL — ABNORMAL HIGH (ref 80.0–100.0)
Monocytes Absolute: 0.6 10*3/uL (ref 0.1–1.0)
Monocytes Relative: 11 %
Neutro Abs: 3.9 10*3/uL (ref 1.7–7.7)
Neutrophils Relative %: 75 %
Platelet Count: 261 10*3/uL (ref 150–400)
RBC: 3.75 MIL/uL — ABNORMAL LOW (ref 3.87–5.11)
RDW: 12.2 % (ref 11.5–15.5)
WBC Count: 5.2 10*3/uL (ref 4.0–10.5)
nRBC: 0 % (ref 0.0–0.2)

## 2019-04-20 ENCOUNTER — Telehealth: Payer: Self-pay | Admitting: Hematology

## 2019-04-20 ENCOUNTER — Encounter: Payer: Self-pay | Admitting: Hematology

## 2019-04-20 ENCOUNTER — Telehealth: Payer: Self-pay

## 2019-04-20 NOTE — Telephone Encounter (Signed)
Faxed recent PET scan results to Dr. Samara Snide at [place] fax [phone] per patient's request.

## 2019-04-20 NOTE — Telephone Encounter (Signed)
Scheduled appt per 6/12 los. Mailed calendar.

## 2019-04-24 ENCOUNTER — Other Ambulatory Visit: Payer: 59

## 2019-04-27 ENCOUNTER — Ambulatory Visit: Payer: 59 | Admitting: Hematology

## 2019-04-30 ENCOUNTER — Encounter: Payer: 59 | Admitting: Physical Therapy

## 2019-05-04 ENCOUNTER — Other Ambulatory Visit: Payer: Self-pay | Admitting: Hematology

## 2019-05-04 ENCOUNTER — Encounter: Payer: Self-pay | Admitting: Hematology

## 2019-05-04 DIAGNOSIS — C210 Malignant neoplasm of anus, unspecified: Secondary | ICD-10-CM

## 2019-05-06 ENCOUNTER — Encounter: Payer: 59 | Attending: Family Medicine | Admitting: *Deleted

## 2019-05-06 ENCOUNTER — Other Ambulatory Visit: Payer: Self-pay

## 2019-05-06 DIAGNOSIS — E119 Type 2 diabetes mellitus without complications: Secondary | ICD-10-CM | POA: Diagnosis not present

## 2019-05-07 NOTE — Patient Instructions (Signed)
Plan: continue: Aim for 2 Carb Choices per meal (30 grams) +/- 1 either way  Aim for 0-1 Carbs per snack if hungry  Include protein in moderation with your meals and snacks Consider reading food labels for Total Carbohydrate of foods Continue with your activity level by arm chair exercises or stationary bike (if you can find a padding that fits your seat) for 5-15 minutes daily as tolerated Consider checking BG at alternate times per day especially after exercise and occasionally after a meal  Today we reviewed the Low Residue Diet which included low fiber foods from each food group. You are already following many of these guidelines, but this provided you with a list in print to refer to.   You are doing a wonderful job with your diabetes control, keep up the good work!

## 2019-05-07 NOTE — Progress Notes (Signed)
Diabetes Self-Management Education  Visit Type: Follow-up  Appt. Start Time: 0800 Appt. End Time: 0830  05/07/2019  [name], identified by name and date of birth, is a 63 y.o. female with a diagnosis of Diabetes:  . She is here for follow up nutrition guidelines as since her diagnosis and previous visit last year, she was diagnosed and treated for anal cancer. She now needs very low fiber diet and wants guidance as to how to implement that along with her diabetes nutrition. She continues to check her BG daily in the AM and reports a range of 90 to 110 with occasional increase to 127 mg/dl. She has gone back to work and attempts to walk some for activity. She has a stationary bike but the seat is too narrow and it cannot be removed or replaced.   ASSESSMENT  Last menstrual period 11/05/2002. There is no height or weight on file to calculate BMI.  Diabetes Self-Management Education - 05/07/19 0832      Visit Information   Visit Type  Follow-up       Individualized Plan for Diabetes Self-Management Training:   Learning Objective:  Patient will have a greater understanding of diabetes self-management. Patient education plan is to attend individual and/or group sessions per assessed needs and concerns.   Plan:   Patient Instructions  Continue: Aim for 2 Carb Choices per meal (30 grams) +/- 1 either way  Aim for 0-1 Carbs per snack if hungry  Include protein in moderation with your meals and snacks Consider reading food labels for Total Carbohydrate of foods Continue with your activity level by arm chair exercises or stationary bike (if you can find a padding that fits your seat) for 5-15 minutes daily as tolerated Consider checking BG at alternate times per day especially after exercise and occasionally after a meal  Today we reviewed the Low Residue Diet which included low fiber foods from each food group. You are already following many of these guidelines, but this provided  you with a list in print to refer to.   You are doing a wonderful job with your diabetes control, keep up the good work!  Expected Outcomes:     Education material provided: Low Residue Diet  If problems or questions, patient to contact team via:  Phone  Future DSME appointment:   PRN

## 2019-05-14 ENCOUNTER — Ambulatory Visit: Payer: 59 | Admitting: Physical Therapy

## 2019-05-14 ENCOUNTER — Encounter: Payer: 59 | Admitting: Physical Therapy

## 2019-05-27 ENCOUNTER — Encounter: Payer: Self-pay | Admitting: Gastroenterology

## 2019-05-28 ENCOUNTER — Ambulatory Visit: Payer: 59 | Admitting: Physical Therapy

## 2019-06-09 ENCOUNTER — Encounter: Payer: 59 | Admitting: Physical Therapy

## 2019-06-10 ENCOUNTER — Encounter: Payer: 59 | Admitting: Physical Therapy

## 2019-06-25 ENCOUNTER — Other Ambulatory Visit: Payer: Self-pay

## 2019-06-25 ENCOUNTER — Encounter: Payer: Self-pay | Admitting: Physical Therapy

## 2019-06-25 ENCOUNTER — Ambulatory Visit: Payer: 59 | Attending: Hematology | Admitting: Physical Therapy

## 2019-06-25 DIAGNOSIS — M6281 Muscle weakness (generalized): Secondary | ICD-10-CM | POA: Diagnosis present

## 2019-06-25 DIAGNOSIS — R279 Unspecified lack of coordination: Secondary | ICD-10-CM | POA: Diagnosis present

## 2019-06-25 DIAGNOSIS — R252 Cramp and spasm: Secondary | ICD-10-CM | POA: Diagnosis not present

## 2019-06-25 DIAGNOSIS — C210 Malignant neoplasm of anus, unspecified: Secondary | ICD-10-CM | POA: Insufficient documentation

## 2019-06-25 NOTE — Patient Instructions (Addendum)
Lubrication . Used for intercourse to reduce friction . Avoid ones that have glycerin, warming gels, tingling gels, icing or cooling gel, scented . Avoid parabens due to a preservative similar to female sex hormone . May need to be reapplied once or several times during sexual activity . Can be applied to both partners genitals prior to vaginal penetration to minimize friction or irritation . Prevent irritation and mucosal tears that cause post coital pain and increased the risk of vaginal and urinary tract infections . Oil-based lubricants cannot be used with condoms due to breaking them down.  Least likely to irritate vaginal tissue.  . Plant based-lubes are safe . Silicone-based lubrication are thicker and last long and used for post-menopausal women  Vaginal Lubricators Here is a list of some suggested lubricators you can use for intercourse. Use the most hypoallergenic product.  You can place on you or your partner.   Slippery Stuff  Sylk or Sliquid Natural H2O ( good  if frequent UTI's)  Blossom Organics (www.blossom-organics.com)  Luvena   Coconut oil  PJur Woman Nude- water based lubricant, amazon  Uberlube- Amazon  Aloe Vera  Yes lubricant- [employer] Platinum-Silicone, Target, [facility]  Olive and Bee intimate cream-  www.oliveandbee.com.au  Pink - Amazon Things to avoid in lubricants are glycerin, warming gels, tingling gels, icing or cooling  gels, and scented gels.  Also avoid Vaseline. KY jelly, Replens, and Astroglide kills good bacteria(lactobacilli)  Things to avoid in the vaginal area . Do not use things to irritate the vulvar area . No lotions- see below . No soaps; can use Aveeno or Calendula cleanser if needed. Must be gentle . No deodorants . No douches . Good to sleep without underwear to let the vaginal area to air out . No scrubbing: spread the lips to let warm water rinse over labias and pat dry  Creams that can be used on the [place] Releveum or Desert [employer] . They are used in the vagina to hydrate the mucous membrane that make up the vaginal canal. . Designed to keep a more normal acid balance (ph) . Once placed in the vagina, it will last between two to three days.  . Use 2-3 times per week at bedtime and last longer than 60 min. . Ingredients to avoid is glycerin and fragrance, can increase chance of infection . Should not be used just before sex due to causing irritation . Most are gels administered either in a tampon-shaped applicator or as a vaginal suppository. They are non-hormonal.   Types of Moisturizers . Samul Dada- drug store . Vitamin E vaginal suppositories- Whole foods, Amazon . Moist Again . Coconut oil- can break down condoms . Julva- (Do no use if on Tamoxifen) amazon . Yes moisturizer- amazon . NeuEve Silk , NeuEve Silver for menopausal or over 65 (if have severe vaginal atrophy or cancer treatments use NeuEve Silk for  1 month than move to [employer] Pepsi)- [employer], MapleFlower.dk . Olive and Bee intimate cream- www.oliveandbee.com.au . Mae vaginal [place] for internal and cleo for outside  Creams to use externally on the Vulva area  Albertson's (good for for cancer patients that had radiation to the area)- [place] or [employer].FlyingBasics.com.br  V-magic cream - amazon  Julva-amazon  Vital "V Wild Yam salve ( help moisturize and help with thinning vulvar area,  does have [place] by Damiva labial moisturizer ([place],    Things to avoid in the vaginal area . Do not use things to irritate the vulvar area . No lotions just specialized creams for the vulva area- Neogyn, V-magic, No soaps; can use Aveeno or Calendula cleanser if needed. Must be gentle . No  deodorants . No douches . Good to sleep without underwear to let the vaginal area to air out . No scrubbing: spread the lips to let warm water rinse over labias and pat dry Guided Meditation for Pelvic Floor Relaxation  [employer] [phone] views.Feb 11, 2016  You tube; use while using the dilator    Access Code: [email or web address]  URL: [email or web address]  Date: 04/02/2019  Prepared by: Earlie Counts   Exercises Supine Butterfly Groin Stretch - 1 reps - 1 sets - 1 min hold - 1x daily - 7x weekly Prone Press Up on Elbows - 5 reps - 1 sets - 15 sec 1 min hold - 1x daily - 7x weekly Seated Piriformis Stretch with Trunk Bend - 1 reps - 1 sets - 30 sec hold - 1x daily - 7x weekly Seated Hamstring Stretch - 1 reps - 1 sets - 30 sec hold - 1x daily - 7x weekly  [facility] [address], [place], [place] [address] Phone # [phone] Fax [phone]

## 2019-06-25 NOTE — Therapy (Signed)
[facility] [address] [place], [place], [address] Phone: [phone]   Fax:  [phone]  Physical Therapy Treatment  Patient Details  Name: Angelica Wright MRN: [id number] Date of Birth: 1956-05-25 Referring Provider (PT): Worthy Flank [facility]   Encounter Date: 06/25/2019  PT End of Session - 06/25/19 1615    Visit Number  3    Date for PT Re-Evaluation  10/25/19    Authorization Type  U[employer]     Authorization - Visit Number  3    Authorization - Number of Visits  22    PT Start Time  1749    PT Stop Time  4496    PT Time Calculation (min)  38 min    Activity Tolerance  Patient tolerated treatment well    Behavior During Therapy  [facility] for tasks assessed/performed       Past Medical History:  Diagnosis Date  . Hyperparathyroidism ([place])    resolved after surgery  . Hypertension   . Osteoporosis    of the spine  . Rectocele   . Squamous cell cancer, anus (HCC)   . TMJ syndrome   . Type 2 Diabetes ([place]) 11/2013   controlled with diet/exer    Past Surgical History:  Procedure Laterality Date  . LYMPH NODE BIOPSY    . PARATHYROIDECTOMY  2000  . THERAPEUTIC ABORTION     x 2  . VULVA /PERINEUM BIOPSY  2012    There were no vitals filed for this visit.  Subjective Assessment - 06/25/19 1617    Subjective  Work and home life has been crazy so I have not been able to do the HEP. I have been trying  to use the dilator and unable to get the smaller one on. Trouble with knowing she has stool or gas when leaks stool.    Patient Stated Goals  strenthen the pelvic floor; reduce leakage    Currently in Pain?  Yes    Pain Score  4     Pain Location  Rectum    Pain Orientation  Mid    Pain Descriptors / Indicators  Sharp    Pain Type  Acute pain    Pain Onset  More than a month ago    Pain Frequency  Intermittent    Aggravating Factors   during bowl movement    Pain Relieving Factors  using the desert harvest  with relevum for the pain    Multiple Pain Sites  No         [facility] PT Assessment - 06/25/19 0001      Assessment   Medical Diagnosis  C21.0 (ICD-10-CM) - Anal cancer (HCC)    Referring Provider (PT)  Worthy Flank [facility]    Prior Therapy  yes      Precautions   Precautions  Other (comment)    Precaution Comments  cancer with radiation and chemo; osteoporosis      Restrictions   Weight Bearing Restrictions  No      [place] residence    Living Arrangements  Spouse/significant other    Type of [place]  Two level      Prior Function   Level of [place]  Part time employment    Engineer, materials    Leisure  taking care of cats, computer games and reading; unable to  sit on stationary bike due to pain on anus      Cognition   Overall Cognitive Status  Within Functional Limits for tasks assessed      Observation/Other Assessments   Focus on Therapeutic Outcomes (FOTO)   therapist discrection is 50% limitation due to fecal and urinary leakage, pain with bowel movements, pain with sitting on bike      Sensation   Light Touch  Appears Intact      Posture/Postural Control   Posture/Postural Control  Postural limitations    Postural Limitations  Rounded Shoulders;Anterior pelvic tilt      AROM   Overall AROM Comments  full lumbar ROM      Strength   Right Hip Flexion  5/5    Right Hip Extension  4+/5    Right Hip External Rotation   4+/5    Right Hip Internal Rotation  4+/5    Right Hip ABduction  4/5    Right Hip ADduction  5/5    Left Hip Flexion  5/5    Left Hip Extension  5/5    Left Hip External Rotation  4+/5    Left Hip Internal Rotation  4+/5    Left Hip ABduction  4+/5    Left Hip ADduction  5/5      Palpation   SI assessment   pelvis in correct alignment                Pelvic Floor Special Questions - 06/25/19 0001    Are you Pregnant or  attempting pregnancy?  No    Number of Pregnancies  2    Number of Vaginal Deliveries  2    Episiotomy Performed  Yes    Urinary Leakage  Yes    Pad use  1    Activities that cause leaking  With strong urge    Urinary frequency  has 4 bowel movement per day    Fecal incontinence  Yes   pass gas and not sure which one   Exam Type  Deferred        [facility] Adult PT Treatment/Exercise - 06/25/19 0001      Self-Care   Self-Care  Other Self-Care Comments    Other Self-Care Comments   education on using lubrincants that may not affect the vaginal area, using meditation with the dilator, correct poisition with using the dilator, using moisturizers in the vaginal area for vaginal health      Lumbar Exercises: Stretches   Active Hamstring Stretch  Right;Left;1 rep;30 seconds   sitting   Press Ups  3 reps;10 seconds    Piriformis Stretch  Right;Left;1 rep;30 seconds   sitting   Other Lumbar Stretch Exercise  supine butterfly stretch             PT Education - 06/25/19 1656    Education Details  Access Code: [email or web address] on vaginal moisturizers and lubricants. reviewed current HEP and how to incorporate into her life    Person(s) Educated  Patient    Methods  Explanation;Demonstration;Handout    Comprehension  Verbalized understanding;Returned demonstration       PT Short Term Goals - 06/25/19 1702      PT SHORT TERM GOAL #1   Title  independent with initial HEP    Period  Days    Status  On-going    [place] Date  03/24/19      PT SHORT TERM GOAL #2   Title  abiltiy to use the  small dilator due to the relaxation of the vaginal tissue    Time  4    Period  Weeks    Status  On-going    Target Date  03/24/19      PT SHORT TERM GOAL #3   Title  burning with urination decreased >/= 25% due to using creams on the vaginal area to protect the skin    Time  4    Period  Weeks    Status  On-going    Target Date  03/24/19      PT SHORT TERM GOAL #4   Title  abilty to  relax the anal canal to increase the diameter of the anal canal    Time  4    Period  Weeks    Status  On-going        PT Long Term Goals - 02/24/19 1840      PT LONG TERM GOAL #1   Title  Pt will be ind with advanced HEP    Time  4    Period  Months    Status  New    Target Date  06/26/19      PT LONG TERM GOAL #2   Title  ability to use the larges dilator due to the expansion of the vaginal canal    Time  4    Period  Months    Status  New    Target Date  06/26/19      PT LONG TERM GOAL #3   Title  urinary leakage daily decreased >/= 60% due to improved pelvic floor strength and coordination    Time  4    Period  Months    Status  New    Target Date  06/26/19      PT LONG TERM GOAL #4   Title  fecal leakage decreased >/= 60% due to improve quality of anal contraction and strength    Time  4    Period  Months    Status  New    Target Date  06/26/19      PT LONG TERM GOAL #5   Title  bowel movement diameter is bigger than a nickle size    Time  4    Period  Months    Status  New    Target Date  06/26/19            Plan - 06/25/19 1632    Clinical Impression Statement  Urinary leakage is decreased by 30%. Patient is using 1 pad instead of 3 pads. Patient reports improved fecal incontinence. Pateint has not come to therapy since 04/02/2019 due to work and life. Patient has been walking daily. Patient is on the smallest dilator and able to place it in for 1 inch. Patient reports irritation to the vaginal area and will talk to her gynecologist. Patient has trouble determining if she has passed gas or fecal. Patient will benefit from skilled therapy to improve pelvic floor health, guiding the patient to expand her vaginal canal to improve urine and fecal leakage.    Personal Factors and Comorbidities  Age;Comorbidity 1;Comorbidity 2;Comorbidity 3+    Comorbidities  rectocele; osteopoross; s/p anal cancer with radiation and chemotherapy treatment     Examination-Activity Limitations  Continence;Sit;Toileting    Rehab Potential  Excellent    Clinical Impairments Affecting Rehab Potential  anal cancer    PT Frequency  1x / week    PT Duration  Other (comment)  4 months   PT Treatment/Interventions  Biofeedback;Neuromuscular re-education;Therapeutic exercise;Therapeutic activities;Patient/family education;Manual techniques;Passive range of motion;Energy conservation    PT Next Visit Plan  abdominal massage; fascial work to the vagina, hip stretches    PT Home Exercise Plan  Access Code: [email or web address]    Recommended Other Services  renewal sent on 06/25/19    Consulted and Agree with Plan of Care  Patient       Patient will benefit from skilled therapeutic intervention in order to improve the following deficits and impairments:  Decreased skin integrity, Increased fascial restricitons, Pain, Decreased coordination, Decreased mobility, Decreased scar mobility, Increased muscle spasms, Decreased activity tolerance, Decreased endurance, Decreased strength  Visit Diagnosis: Cramp and spasm - Plan: PT plan of care cert/re-cert  Muscle weakness (generalized) - Plan: PT plan of care cert/re-cert  Unspecified lack of coordination - Plan: PT plan of care cert/re-cert  Anal cancer ([place]) - Plan: PT plan of care cert/re-cert     Problem List Patient Active Problem List   Diagnosis Date Noted  . Anal cancer ([place]) 10/14/2018  . Diabetes ([place]) 09/29/2018  . Genetic testing 03/19/2018  . Family history of breast cancer   . Family history of uterine cancer   . Osteopenia 07/06/2015  . Essential hypertension 07/06/2015  . Atrophic vaginitis 07/06/2015  . Lichen sclerosus et atrophicus of the vulva 07/06/2015    Earlie Counts, PT 06/25/19 5:05 PM   [facility] [address], [place], [place], [address] Phone: [phone]   Fax:  [phone]  Name: Angelica Wright MRN:  [id number] Date of Birth: [birth year]

## 2019-06-29 ENCOUNTER — Encounter: Payer: Self-pay | Admitting: Gastroenterology

## 2019-06-29 ENCOUNTER — Ambulatory Visit (INDEPENDENT_AMBULATORY_CARE_PROVIDER_SITE_OTHER): Payer: 59 | Admitting: Gastroenterology

## 2019-06-29 ENCOUNTER — Other Ambulatory Visit (INDEPENDENT_AMBULATORY_CARE_PROVIDER_SITE_OTHER): Payer: 59

## 2019-06-29 VITALS — BP 132/70 | HR 76 | Temp 97.6°F | Ht 61.0 in | Wt 147.6 lb

## 2019-06-29 DIAGNOSIS — R197 Diarrhea, unspecified: Secondary | ICD-10-CM | POA: Diagnosis not present

## 2019-06-29 DIAGNOSIS — R140 Abdominal distension (gaseous): Secondary | ICD-10-CM | POA: Diagnosis not present

## 2019-06-29 LAB — SEDIMENTATION RATE: Sed Rate: 19 mm/hr (ref 0–30)

## 2019-06-29 LAB — C-REACTIVE PROTEIN: CRP: <1 mg/dL (ref 0.5–20.0)

## 2019-06-29 MED ORDER — DICYCLOMINE HCL 20 MG PO TABS: 20.0000 mg | ORAL_TABLET | Freq: Three times a day (TID) | ORAL | 3 refills | Status: DC

## 2019-06-29 NOTE — Progress Notes (Signed)
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 Differential is broad with associated bloating and fecal incontinence. The differential diagnosis of chronic diarrhea without alarm features includes: irritable bowel syndrome, IBD, SIBO, celiac disease, missed infection (such as giardia), food intolerance, microscopic colitis, thyroid disorder, other functional GI disease.   We reviewed her recent PET scan. Reviewed the asymptomatic umbilical hernia and diagnosis of diverticulosis. I asked her to discuss the findings of abdominal aortic atherosclerosis with Dr. Kenton Kingfisher.     PLAN: ESR, CRP, fecal calprotectin GI stool pathogen panel TSH [facility] lactulose breath test Colonoscopy with random  Add psyllium bulking agent yesterday Bentyl 20 mg QID for postprandial defecation Testing for celiac would be appropriate if evaluation above is nondiagnostic   Please see the "Patient Instructions" section for addition details about the plan.  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 Burr Medico ffor further evaluation for diarrhea.  The patient reports an abnormal PET scan and difficulty digesting.  The history is obtained through the patient and review of her electronic health record.  She has a history of stage IIIa  (cT2, cN1b, cM0)  squamous cell anal cancer diagnosed 09/2018 and completed concurrent chemoradiation 12/25/2018. She has severe vaginal atrophy following radiation treatment. She also has hyperparathyroidism, hypertension, osteoporosis, TMJ, and type 2 diabetes controlled with diet and exercise.    Had a PET scan 04/14/19. She has several questions specific to the PET scan. She specifically asks about abdominal findings that are normal,  abdominal aortic atherosclerosis, diverticulosis, and a small umbilical hernia containing only fat.   She is having 3-5 bowel movements a day with associated urgency. Starts with small formed stools that become diarrhea as the bowel movements progress during the day. Even worse during treatment. Exacerbated by eating raw foods.  Has incontinence 1-2 times a week. Associated bloating.  Rectal pain with defecation.  Occasionally sees black tarry stools. She is having difficulty with hemorrhoids with the diarrhea. She is currently participating in PT for pelvic floor exercises.  She will use Imodium but only in moderation because of the resulting severe constipation.  Normal appetite.  Normal energy.   She feels gas moving through the large intestines prior to defecation.  No other associated symptoms. No identified exacerbating or relieving features.   Saw a dietician in March who recommended raw vegetables. She has been on a strict low residue diet.    She wonders if she is lactose intolerant.   She is interested in gut probiotics.   Evaluated by Dr. Penelope Coop 11/17/2009 for left lower quadrant abdominal cramping pain associated with diarrhea.  The symptoms occurred the day after she eats certain foods such as lettuce, tomatoes, corn, peppers, and certain fruits.  Father had problems with diverticulosis. Mother with endometrial cancer. No known family history of colon cancer or polyps. No  family history of pancreatic cancer or gastric/stomach cancer.  Prior abdominal  imaging: Abdominal ultrasound 01/19/02: echogenic liver, small calculus in the left kidney   Past Medical History:  Diagnosis Date  . Hyperparathyroidism ([place])    resolved after surgery  . Hypertension   . Osteoporosis    of the spine  . Rectocele   . Squamous cell cancer, anus (HCC)   . TMJ syndrome   . Type 2 Diabetes ([place]) 11/2013   controlled with diet/exer    Past Surgical History:  Procedure Laterality Date  . LYMPH NODE BIOPSY    . PARATHYROIDECTOMY  2000  . THERAPEUTIC ABORTION     x 2  . VULVA /PERINEUM BIOPSY  2012    Current Outpatient Medications  Medication Sig Dispense Refill  . aspirin 81 MG tablet Take 81 mg by mouth every evening.     Marland Kitchen atenolol (TENORMIN) 50 MG tablet Take 50 mg by mouth every evening.     . calcium carbonate (TUMS - DOSED IN MG ELEMENTAL CALCIUM) 500 MG chewable tablet Chew 1 tablet by mouth daily as needed for indigestion or heartburn.    . cholecalciferol (VITAMIN D3) 25 MCG (1000 UT) tablet Take 2,000 Units by mouth every evening.    . clobetasol ointment (TEMOVATE) 0.05 % Apply a pea sized amount topically BID x 1-2 weeks prn flare 30 g 1  . Coenzyme Q10 (CO Q 10) 100 MG CAPS Take 100 mg by mouth daily.     . diphenoxylate-atropine (LOMOTIL) 2.5-0.025 MG tablet Take 1-2 tablets by mouth 4 (four) times daily as needed for diarrhea or loose stools. 30 tablet 1  . hydrochlorothiazide (HYDRODIURIL) 12.5 MG tablet Take 12.5 mg by mouth daily.     . hydrocortisone (ANUSOL-HC) 25 MG suppository Place 1 suppository (25 mg total) rectally 2 (two) times daily. 12 suppository 1  . ibuprofen (ADVIL,MOTRIN) 100 MG tablet Take 400-600 mg by mouth 2 (two) times daily as needed for pain.    . Magnesium 250 MG TABS Take 250 mg by mouth every evening.    . Multiple Vitamin (MULTIVITAMIN WITH MINERALS) TABS tablet Take 1 tablet by mouth daily.    . silver sulfADIAZINE (SILVADENE) 1 % cream Apply 1 application topically daily. 50 g 0  . SUPER B COMPLEX/C  PO Take 1 tablet by mouth daily.    . valACYclovir (VALTREX) 500 MG tablet Take one tab po BID for 3 days with flares. 30 tablet 1  . vitamin C (ASCORBIC ACID) 500 MG tablet Take 500 mg by mouth every evening.     No current facility-administered medications for this visit.     Allergies as of 06/29/2019 - Review Complete 06/25/2019  Allergen Reaction Noted  . Sulfa antibiotics Nausea And Vomiting 01/16/2018    Family History  Problem Relation Age of Onset  . Breast cancer Mother 56       lumpectomy, x2 in other breast in late 10's  . Endometrial cancer Mother 39  . Breast cancer Maternal Aunt        dx in early 49's, x2 in late 80's  . Breast cancer Paternal Aunt 56  . Breast cancer Paternal Aunt 85  . Cirrhosis Father        ETOH related  . Liver cancer Father   . Lung cancer Father        tobacco user  . Stroke Maternal Grandmother   . Stroke Maternal Grandfather   . Emphysema Paternal Grandmother   .  Diabetes Paternal Grandfather   . Breast cancer Cousin 58    Social History   Socioeconomic History  . Marital status: Married    Spouse name: Not on file  . Number of children: Not on file  . Years of education: Not on file  . Highest education level: Not on file  Occupational History  . Not on file  Social Needs  . Financial resource strain: Not on file  . Food insecurity    Worry: Not on file    Inability: Not on file  . Transportation needs    Medical: No    Non-medical: No  Tobacco Use  . Smoking status: Never Smoker  . Smokeless tobacco: Never Used  Substance and Sexual Activity  . Alcohol use: Yes    Alcohol/week: 2.0 standard drinks    Types: 2 Standard drinks or equivalent per week    Comment: 1-2 per week  . Drug use: No  . Sexual activity: Yes    Partners: Male    Birth control/protection: Other-see comments    Comment: vasectomy  Lifestyle  . Physical activity    Days per week: Not on file    Minutes per session: Not on file  . Stress:  Not on file  Relationships  . Social Herbalist on phone: Not on file    Gets together: Not on file    Attends religious service: Not on file    Active member of club or organization: Not on file    Attends meetings of clubs or organizations: Not on file    Relationship status: Not on file  . Intimate partner violence    Fear of current or ex partner: Not on file    Emotionally abused: Not on file    Physically abused: Not on file    Forced sexual activity: Not on file  Other Topics Concern  . Not on file  Social History Narrative  . Not on file    Review of Systems: 12 system ROS is negative except as noted above urine leakage, anxiety, arthritis, depression, and fatigue.   Physical Exam: General:   Alert,  well-nourished, pleasant and cooperative in NAD Head:  Normocephalic and atraumatic. Eyes:  Sclera clear, no icterus.   Conjunctiva pink. Ears:  Normal auditory acuity. Nose:  No deformity, discharge,  or lesions. Mouth:  No deformity or lesions.   Neck:  Supple; no masses or thyromegaly. Lungs:  Clear throughout to auscultation.   No wheezes. Heart:  Regular rate and rhythm; no murmurs. Abdomen:  Soft,nontender, nondistended, normal bowel sounds, no rebound or guarding. No hepatosplenomegaly.   Rectal:  Deferred  Msk:  Symmetrical. No boney deformities LAD: No inguinal or umbilical LAD Extremities:  No clubbing or edema. Neurologic:  Alert and  oriented x4;  grossly nonfocal Skin:  Intact without significant lesions or rashes. Psych:  Alert and cooperative. Normal mood and affect.     Nazli Penn L. Tarri Glenn, MD, MPH 06/29/2019, 9:45 AM

## 2019-06-29 NOTE — Patient Instructions (Addendum)
[facility] lactulose breath test recommended to diagnose lactulose breath test.   Please add a daily stool bulking agent such as Metamucil.   I have recommended a trial of Bentyl 20 mg four times a day to try to help improve your bowel movements that happen soon after eating.  If you are interested in trying a probiotic, I recommend a trial of Align.   It has been recommended to you by your physician that you have a(n) colonoscopy completed. Per your request, we did not schedule the procedure today. Please contact our office at [phone] should you decide to have the procedure completed.

## 2019-06-30 LAB — TSH: TSH: 1.67 u[IU]/mL (ref 0.35–4.50)

## 2019-07-02 ENCOUNTER — Encounter: Payer: Self-pay | Admitting: Physical Therapy

## 2019-07-02 ENCOUNTER — Telehealth: Payer: Self-pay | Admitting: Obstetrics and Gynecology

## 2019-07-02 NOTE — Telephone Encounter (Signed)
Spoke with patient. Hx of anal cancer. Started using vaginal dilators approximately 2 wks ago. Is using the smallest dilator about q other day. Reports bleeding morning after use and irritation from the lubrication. Denies vaginal odor. Requesting OV and is concerned Dr. Talbert Nan may not be able to complete an exam since she is only able to use small dilator. Recommended OV for further discussion and evaluation. OV scheduled for 8/31 at 9:30am. Advised patient to stop using dilator until exam with Dr. Talbert Nan. Dr. Talbert Nan will review, will return call if any additional recommendations. Patient agreeable.   Dr. Talbert Nan -please review.

## 2019-07-02 NOTE — Telephone Encounter (Signed)
Appointment Request From: Angelica Wright  With Provider: Salvadore Dom, MD [facility]]  Preferred Date Range: 07/03/2019 - 07/10/2019  Preferred Times: Any Time  Reason for visit: Office Visit  Comments: vaginal bleeding (and irritation) after use of smallest vaginal dialator, recommended to me for use after radiation treatments for anal cancer.

## 2019-07-06 ENCOUNTER — Encounter: Payer: Self-pay | Admitting: Nurse Practitioner

## 2019-07-06 ENCOUNTER — Other Ambulatory Visit: Payer: Self-pay

## 2019-07-06 ENCOUNTER — Ambulatory Visit (INDEPENDENT_AMBULATORY_CARE_PROVIDER_SITE_OTHER): Payer: 59 | Admitting: Obstetrics and Gynecology

## 2019-07-06 ENCOUNTER — Encounter: Payer: Self-pay | Admitting: Obstetrics and Gynecology

## 2019-07-06 VITALS — BP 130/64 | HR 78 | Temp 97.9°F | Ht 61.0 in | Wt 148.6 lb

## 2019-07-06 DIAGNOSIS — Z85048 Personal history of other malignant neoplasm of rectum, rectosigmoid junction, and anus: Secondary | ICD-10-CM | POA: Diagnosis not present

## 2019-07-06 DIAGNOSIS — L900 Lichen sclerosus et atrophicus: Secondary | ICD-10-CM

## 2019-07-06 DIAGNOSIS — Z923 Personal history of irradiation: Secondary | ICD-10-CM | POA: Diagnosis not present

## 2019-07-06 DIAGNOSIS — N952 Postmenopausal atrophic vaginitis: Secondary | ICD-10-CM

## 2019-07-06 MED ORDER — ESTRADIOL 10 MCG VA TABS: ORAL_TABLET | VAGINAL | 1 refills | Status: DC

## 2019-07-06 NOTE — Progress Notes (Signed)
GYNECOLOGY  VISIT   HPI: 63 y.o.   Married Other or two or more races Not Hispanic or Latino  female   [phone] with Patient's last menstrual period was 11/05/2002 (approximate).   She finished treatment for anal cancer in 2/20 (chemotherapy and radiation). She has been trying to use vaginal dilators, started with a q tip. The dilators from the [place] office were too big. She found a smaller dilator online, it's painful to insert. She has used the smallest dilator on Tuesday she says that she used it again on Thursday and she noticed blood when she wiped and on her pad that day. She has not used the dilator since. She is using surgical lubrication. She is also seeing PT.  She isn't using a vaginal estrogen.  She is having ~5 normal to watery BM's a day. She has some urgency for BM, can have some leakage of gas or loose stool.  There is some pressure from her husband to be sexually active again.   GYNECOLOGIC HISTORY: Patient's last menstrual period was 11/05/2002 (approximate). Contraception:PMP Menopausal hormone therapy: none        OB History    Gravida  4   Para  2   Term  2   Preterm  0   AB  2   Living  2     SAB  0   TAB  2   Ectopic  0   Multiple  0   Live Births  2              Patient Active Problem List   Diagnosis Date Noted  . Anal cancer ([place]) 10/14/2018  . Diabetes ([place]) 09/29/2018  . Genetic testing 03/19/2018  . Family history of breast cancer   . Family history of uterine cancer   . Osteopenia 07/06/2015  . Essential hypertension 07/06/2015  . Atrophic vaginitis 07/06/2015  . Lichen sclerosus et atrophicus of the vulva 07/06/2015    Past Medical History:  Diagnosis Date  . Hyperparathyroidism ([place])    resolved after surgery  . Hypertension   . Osteoporosis    of the spine  . Rectocele   . Squamous cell cancer, anus (HCC)   . TMJ syndrome   . Type 2 Diabetes ([place]) 11/2013   controlled with diet/exer    Past Surgical History:   Procedure Laterality Date  . LYMPH NODE BIOPSY    . PARATHYROIDECTOMY  2000  . THERAPEUTIC ABORTION     x 2  . VULVA /PERINEUM BIOPSY  2012    Current Outpatient Medications  Medication Sig Dispense Refill  . aspirin 81 MG tablet Take 81 mg by mouth every evening.     Marland Kitchen atenolol (TENORMIN) 50 MG tablet Take 50 mg by mouth every evening.     . calcium carbonate (TUMS - DOSED IN MG ELEMENTAL CALCIUM) 500 MG chewable tablet Chew 1 tablet by mouth daily as needed for indigestion or heartburn.    . cholecalciferol (VITAMIN D3) 25 MCG (1000 UT) tablet Take 2,000 Units by mouth every evening.    . clobetasol ointment (TEMOVATE) 0.05 % Apply a pea sized amount topically BID x 1-2 weeks prn flare 30 g 1  . Coenzyme Q10 (CO Q 10) 100 MG CAPS Take 100 mg by mouth daily.     . diphenoxylate-atropine (LOMOTIL) 2.5-0.025 MG tablet Take 1-2 tablets by mouth 4 (four) times daily as needed for diarrhea or loose stools. 30 tablet 1  . hydrochlorothiazide (HYDRODIURIL) 12.5 MG  tablet Take 12.5 mg by mouth daily.     . hydrocortisone (ANUSOL-HC) 25 MG suppository Place 1 suppository (25 mg total) rectally 2 (two) times daily. 12 suppository 1  . ibuprofen (ADVIL,MOTRIN) 100 MG tablet Take 400-600 mg by mouth 2 (two) times daily as needed for pain.    . Magnesium 250 MG TABS Take 250 mg by mouth every evening.    . Multiple Vitamin (MULTIVITAMIN WITH MINERALS) TABS tablet Take 1 tablet by mouth daily.    . silver sulfADIAZINE (SILVADENE) 1 % cream Apply 1 application topically daily. 50 g 0  . SUPER B COMPLEX/C PO Take 1 tablet by mouth daily.    . valACYclovir (VALTREX) 500 MG tablet Take one tab po BID for 3 days with flares. 30 tablet 1  . vitamin C (ASCORBIC ACID) 500 MG tablet Take 500 mg by mouth every evening.    . dicyclomine (BENTYL) 20 MG tablet Take 1 tablet (20 mg total) by mouth 4 (four) times daily -  before meals and at bedtime. (Patient not taking: Reported on 07/06/2019) 120 tablet 3   No  current facility-administered medications for this visit.      ALLERGIES: Sulfa antibiotics  Family History  Problem Relation Age of Onset  . Breast cancer Mother 23       lumpectomy, x2 in other breast in late 97's  . Endometrial cancer Mother 1  . Breast cancer Maternal Aunt        dx in early 43's, x2 in late 80's  . Breast cancer Paternal Aunt 54  . Breast cancer Paternal Aunt 85  . Cirrhosis Father        ETOH related  . Liver cancer Father   . Lung cancer Father        tobacco user  . Liver disease Father   . Stroke Maternal Grandmother   . Stroke Maternal Grandfather   . Emphysema Paternal Grandmother   . Diabetes Paternal Grandfather   . Breast cancer Cousin 67    Social History   Socioeconomic History  . Marital status: Married    Spouse name: Not on file  . Number of children: Not on file  . Years of education: Not on file  . Highest education level: Not on file  Occupational History  . Not on file  Social Needs  . Financial resource strain: Not on file  . Food insecurity    Worry: Not on file    Inability: Not on file  . Transportation needs    Medical: No    Non-medical: No  Tobacco Use  . Smoking status: Never Smoker  . Smokeless tobacco: Never Used  Substance and Sexual Activity  . Alcohol use: Yes    Alcohol/week: 2.0 standard drinks    Types: 2 Standard drinks or equivalent per week    Comment: 1-2 per week  . Drug use: No  . Sexual activity: Yes    Partners: Male    Birth control/protection: Other-see comments    Comment: vasectomy-husband  Lifestyle  . Physical activity    Days per week: Not on file    Minutes per session: Not on file  . Stress: Not on file  Relationships  . Social Herbalist on phone: Not on file    Gets together: Not on file    Attends religious service: Not on file    Active member of club or organization: Not on file    Attends meetings of clubs  or organizations: Not on file    Relationship status:  Not on file  . Intimate partner violence    Fear of current or ex partner: Not on file    Emotionally abused: Not on file    Physically abused: Not on file    Forced sexual activity: Not on file  Other Topics Concern  . Not on file  Social History Narrative  . Not on file    Review of Systems  All other systems reviewed and are negative.   PHYSICAL EXAMINATION:    BP 130/64   Pulse 78   Temp 97.9 F (36.6 C)   Ht 5\' 1"  (1.549 m)   Wt 148 lb 9.6 oz (67.4 kg)   LMP 11/05/2002 (Approximate)   SpO2 98%   BMI 28.08 kg/m     General appearance: alert, cooperative and appears stated age  Pelvic: External genitalia:  no lesions, mild agglutination of the right labia minora to majora, mild whitening              Urethra:  normal appearing urethra with no masses, tenderness or lesions              Bartholins and Skenes: normal                 Vagina: very narrow, able to partially insert a pediatric speculum, only able to open it a few mm. Limited exam, no abnormalities seen. Only able to insert pinky finger into the vagina. Unable to reach her cervix.                Cervix: not seen              Bimanual Exam:  Uterus:  very limited exam, no masses noted.               Adnexa: no mass, fullness, tenderness              Rectovaginal: deferred  Chaperone was present for exam.  ASSESSMENT Severe vaginal atrophy s/p radiation for anal cancer Some bleeding after using a vaginal dilator, it was painful. She hasn't had any other bleeding. Exam is very limited Lichen sclerosis, not currently bothersome.    PLAN Start vaginal estradiol tablets (no contraindications), use qhs x 2 weeks, then 2 x a week Don't attempt to use the dilator for a week F/U in 4 weeks If she has bleeding unrelated to the dilator, will further evaluate.    An After Visit Summary was printed and given to the patient.  ~15 minutes face to face time of which over 50% was spent in counseling.

## 2019-07-06 NOTE — Patient Instructions (Signed)
Check goodrx to see if you can find a discount for the vaginal estradiol tablets (10 mcg).    Atrophic Vaginitis Atrophic vaginitis is a condition in which the tissues that line the vagina become dry and thin. This condition occurs in women who have stopped having their period. It is caused by a drop in a female hormone (estrogen). This hormone helps:  To keep the vagina moist.  To make a clear fluid. This clear fluid helps: ? To make the vagina ready for sex. ? To protect the vagina from infection. If the lining of the vagina is dry and thin, it may cause irritation, burning, or itchiness. It may also:  Make sex painful.  Make an exam of your vagina painful.  Cause bleeding.  Make you lose interest in sex.  Cause a burning feeling when you pee (urinate).  Cause a brown or yellow fluid to come from your vagina. Some women do not have symptoms. Follow these instructions at home: Medicines  Take over-the-counter and prescription medicines only as told by your doctor.  Do not use herbs or other medicines unless your doctor says it is okay.  Use medicines for for dryness. These include: ? Oils to make the vagina soft. ? Creams. ? Moisturizers. General instructions  Do not douche.  Do not use products that can make your vagina dry. These include: ? Scented sprays. ? Scented tampons. ? Scented soaps.  Sex can help increase blood flow and soften the tissue in the vagina. If it hurts to have sex: ? Tell your partner. ? Use products to make sex more comfortable. Use these only as told by your doctor. Contact a doctor if you:  Have discharge from the vagina that is different than usual.  Have a bad smell coming from your vagina.  Have new symptoms.  Do not get better.  Get worse. Summary  Atrophic vaginitis is a condition in which the lining of the vagina becomes dry and thin.  This condition affects women who have stopped having their periods.  Treatment may  include using products that help make the vagina soft.  Call a doctor if do not get better with treatment. This information is not intended to replace advice given to you by your health care provider. Make sure you discuss any questions you have with your health care provider. Document Released: 04/09/2008 Document Revised: 11/04/2017 Document Reviewed: 11/04/2017 Elsevier Patient Education  2020 [employer].

## 2019-07-10 ENCOUNTER — Encounter: Payer: Self-pay | Admitting: Physical Therapy

## 2019-07-10 ENCOUNTER — Other Ambulatory Visit: Payer: Self-pay

## 2019-07-10 ENCOUNTER — Ambulatory Visit: Payer: 59 | Attending: Hematology | Admitting: Physical Therapy

## 2019-07-10 DIAGNOSIS — R279 Unspecified lack of coordination: Secondary | ICD-10-CM | POA: Diagnosis present

## 2019-07-10 DIAGNOSIS — R252 Cramp and spasm: Secondary | ICD-10-CM | POA: Diagnosis present

## 2019-07-10 DIAGNOSIS — C210 Malignant neoplasm of anus, unspecified: Secondary | ICD-10-CM | POA: Insufficient documentation

## 2019-07-10 DIAGNOSIS — M6281 Muscle weakness (generalized): Secondary | ICD-10-CM | POA: Diagnosis present

## 2019-07-10 NOTE — Therapy (Signed)
[facility] [address] [place], [place], [address] Phone: [phone]   Fax:  [phone]  Physical Therapy Treatment  Patient Details  Name: Angelica Wright MRN: [id number] Date of Birth: 23-Apr-1956 Referring Provider (PT): Worthy Flank [facility]   Encounter Date: 07/10/2019  PT End of Session - 07/10/19 0927    Visit Number  4    Date for PT Re-Evaluation  10/25/19    Authorization Type  UHC     Authorization - Visit Number  4    Authorization - Number of Visits  22    PT Start Time  0845    PT Stop Time  0925    PT Time Calculation (min)  40 min    Activity Tolerance  Patient tolerated treatment well;No increased pain    Behavior During Therapy  WFL for tasks assessed/performed       Past Medical History:  Diagnosis Date  . Hyperparathyroidism ([place])    resolved after surgery  . Hypertension   . Osteoporosis    of the spine  . Rectocele   . Squamous cell cancer, anus (HCC)   . TMJ syndrome   . Type 2 Diabetes ([place]) 11/2013   controlled with diet/exer    Past Surgical History:  Procedure Laterality Date  . LYMPH NODE BIOPSY    . PARATHYROIDECTOMY  2000  . THERAPEUTIC ABORTION     x 2  . VULVA /PERINEUM BIOPSY  2012    There were no vitals filed for this visit.  Subjective Assessment - 07/10/19 0849    Subjective  I had to go the gyn and saw her on Monday. I wsa able to get it in the first time. I had some bleeding on the outside but the next day I had blood coming out. I am using Estradiol capsule 2 times per week. Today I struggled to have a bowel movement. I have one intial bowel movement that is long then have 3-5 times.    Patient Stated Goals  strenthen the pelvic floor; reduce leakage    Currently in Pain?  Yes    Pain Score  8     Pain Location  Rectum    Pain Orientation  Mid    Pain Descriptors / Indicators  Sharp    Pain Type  Acute pain    Pain Onset  More than a month ago    Pain  Frequency  Intermittent    Aggravating Factors   during a bowel movement    Pain Relieving Factors  using the desert harvest with relevum for the pain    Multiple Pain Sites  No                       [facility] Adult PT Treatment/Exercise - 07/10/19 0001      Manual Therapy   Manual Therapy  Myofascial release;Soft tissue mobilization    Manual therapy comments  education on massaging around the rectum prior and after a bowel movment, instruction on massaging the bulbocavernosus and ischiocavernsus daily to relax the tissue    Soft tissue mobilization  external anal sphincter, perineal body, bulbocavernosus, ischiocavernosus, levator ani externally    Myofascial Release  release around the vuvlar area in sidely             PT Education - 07/10/19 0927    Education Details  education on perineal massage and anal massage    Person(s) Educated  Patient  Methods  Explanation;Demonstration    Comprehension  Verbalized understanding       PT Short Term Goals - 07/10/19 0931      PT SHORT TERM GOAL #1   Title  independent with initial HEP    Time  4    Period  Days    Status  Achieved      PT SHORT TERM GOAL #2   Title  abiltiy to use the small dilator due to the relaxation of the vaginal tissue    Time  4    Period  Weeks    Status  On-going    Target Date  03/24/19        PT Long Term Goals - 02/24/19 1840      PT LONG TERM GOAL #1   Title  Pt will be ind with advanced HEP    Time  4    Period  Months    Status  New    Target Date  06/26/19      PT LONG TERM GOAL #2   Title  ability to use the larges dilator due to the expansion of the vaginal canal    Time  4    Period  Months    Status  New    Target Date  06/26/19      PT LONG TERM GOAL #3   Title  urinary leakage daily decreased >/= 60% due to improved pelvic floor strength and coordination    Time  4    Period  Months    Status  New    Target Date  06/26/19      PT LONG TERM GOAL #4    Title  fecal leakage decreased >/= 60% due to improve quality of anal contraction and strength    Time  4    Period  Months    Status  New    Target Date  06/26/19      PT LONG TERM GOAL #5   Title  bowel movement diameter is bigger than a nickle size    Time  4    Period  Months    Status  New    Target Date  06/26/19            Plan - 07/10/19 0858    Clinical Impression Statement  Patient reports decrease in pain to 3/10 after manual work. Patient was bleeding after using the vaginal dilator so she saw her gynecologist. She was prescribed Estradiol to use internally due ot vaginal atrophy. Patient was educated on massaging externally around the anus prior and after bowel movements to work on pain. Patient will benefit from therapy to improve pelvic floor health, guiding patient to expand her vaginal canal to improve urine and fecal leakage.    Personal Factors and Comorbidities  Age;Comorbidity 1;Comorbidity 2;Comorbidity 3+    Comorbidities  rectocele; osteopoross; s/p anal cancer with radiation and chemotherapy treatment    Examination-Activity Limitations  Continence;Sit;Toileting    Stability/Clinical Decision Making  Evolving/Moderate complexity    Rehab Potential  Excellent    Clinical Impairments Affecting Rehab Potential  anal cancer    PT Frequency  1x / week    PT Duration  Other (comment)   4 months   PT Treatment/Interventions  Biofeedback;Neuromuscular re-education;Therapeutic exercise;Therapeutic activities;Patient/family education;Manual techniques;Passive range of motion;Energy conservation    PT Next Visit Plan  abdominal massage; fascial work to the vagina, hip stretches    PT Home Exercise Plan  Access  [email or web address]    Recommended Other Services  MD signed the renewal note    Consulted and Agree with Plan of Care  Patient       Patient will benefit from skilled therapeutic intervention in order to improve the following deficits and impairments:   Decreased skin integrity, Increased fascial restricitons, Pain, Decreased coordination, Decreased mobility, Decreased scar mobility, Increased muscle spasms, Decreased activity tolerance, Decreased endurance, Decreased strength  Visit Diagnosis: Cramp and spasm  Muscle weakness (generalized)  Unspecified lack of coordination  Anal cancer [facility])     Problem List Patient Active Problem List   Diagnosis Date Noted  . Anal cancer ([place]) 10/14/2018  . Diabetes ([place]) 09/29/2018  . Genetic testing 03/19/2018  . Family history of breast cancer   . Family history of uterine cancer   . Osteopenia 07/06/2015  . Essential hypertension 07/06/2015  . Atrophic vaginitis 07/06/2015  . Lichen sclerosus et atrophicus of the vulva 07/06/2015    Earlie Counts, PT 07/10/19 9:32 AM    [facility] [address] [place], [place], [address] Phone: [phone]   Fax:  [phone]  Name: Angelica Wright MRN: [id number] Date of Birth: [birth year]

## 2019-07-24 ENCOUNTER — Other Ambulatory Visit: Payer: Self-pay

## 2019-07-24 ENCOUNTER — Ambulatory Visit: Payer: 59 | Admitting: Physical Therapy

## 2019-07-24 ENCOUNTER — Encounter: Payer: Self-pay | Admitting: Physical Therapy

## 2019-07-24 DIAGNOSIS — R252 Cramp and spasm: Secondary | ICD-10-CM | POA: Diagnosis not present

## 2019-07-24 DIAGNOSIS — M6281 Muscle weakness (generalized): Secondary | ICD-10-CM

## 2019-07-24 DIAGNOSIS — C210 Malignant neoplasm of anus, unspecified: Secondary | ICD-10-CM

## 2019-07-24 DIAGNOSIS — R279 Unspecified lack of coordination: Secondary | ICD-10-CM

## 2019-07-24 NOTE — Therapy (Signed)
[facility] [address] [place], [place], [address] Phone: [phone]   Fax:  [phone]  Physical Therapy Treatment  Patient Details  Name: Angelica Wright MRN: [id number] Date of Birth: [birth year] Referring Provider (PT): W[facility]   Encounter Date: 07/24/2019  PT End of Session - 07/24/19 1041    Visit Number  5    Date for PT Re-Evaluation  10/25/19    Authorization Type  UHC     Authorization - Visit Number  5    Authorization - Number of Visits  22    PT Start Time  0845    PT Stop Time  0925    PT Time Calculation (min)  40 min    Activity Tolerance  Patient tolerated treatment well;No increased pain    Behavior During Therapy  WFL for tasks assessed/performed       Past Medical History:  Diagnosis Date  . Hyperparathyroidism ([place])    resolved after surgery  . Hypertension   . Osteoporosis    of the spine  . Rectocele   . Squamous cell cancer, anus (HCC)   . TMJ syndrome   . Type 2 Diabetes ([place]) 11/2013   controlled with diet/exer    Past Surgical History:  Procedure Laterality Date  . LYMPH NODE BIOPSY    . PARATHYROIDECTOMY  2000  . THERAPEUTIC ABORTION     x 2  . VULVA /PERINEUM BIOPSY  2012    There were no vitals filed for this visit.  Subjective Assessment - 07/24/19 0850    Subjective  Last couple of days I have been bleeding and is red and itching. I have not used the dilator due to the irritation. I ate tomatoes yesterday but that trigger for irritation.    Patient Stated Goals  strenthen the pelvic floor; reduce leakage    Currently in Pain?  Yes    Pain Score  5     Pain Location  Rectum    Pain Orientation  Mid    Pain Descriptors / Indicators  Sharp    Pain Type  Acute pain    Pain Onset  More than a month ago    Pain Frequency  Intermittent    Aggravating Factors   during a bowel movement    Pain Relieving Factors  using the desert harvest with relevum for  the pain    Multiple Pain Sites  No                    Pelvic Floor Special Questions - 07/24/19 0001    Pelvic Floor Internal Exam  Patient confirms identification and approves PT to assess pelvic floor and treatment    Exam Type  Vaginal    Palpation  was able to place right index finger 2/3 into the vaginal canal for first time        [facility] Adult PT Treatment/Exercise - 07/24/19 0001      Self-Care   Self-Care  Other Self-Care Comments    Other Self-Care Comments   education on ways to care for the vaginal and anal skin area and reduce the pain, suggested several creams that may be more cost effective      Manual Therapy   Manual Therapy  Myofascial release;Internal Pelvic Floor    Myofascial Release  release of the fascia around the vulva area and labia minor with monitoring pain to elongate the tissue to improve blood flow and  prepare for the vaignal dilator    Internal Pelvic Floor  gentle soft tissue work to posterior forchette and to the left obturator internist             PT Education - 07/24/19 1039    Education Details  instruction on vitamin E suppositories and recta care to assist in rectal pain    Person(s) Educated  Patient    Methods  Explanation;Handout    Comprehension  Verbalized understanding       PT Short Term Goals - 07/24/19 1045      PT SHORT TERM GOAL #2   Title  abiltiy to use the small dilator due to the relaxation of the vaginal tissue    Baseline  not able to due to irritation of the tissue    Time  4    Period  Weeks    Status  On-going    Target Date  03/24/19      PT SHORT TERM GOAL #3   Title  burning with urination decreased >/= 25% due to using creams on the vaginal area to protect the skin    Time  4    Period  Weeks    Status  On-going    Target Date  03/24/19      PT SHORT TERM GOAL #4   Title  abilty to relax the anal canal to increase the diameter of the anal canal    Time  4    Period  Weeks    Status   On-going    Target Date  03/24/19        PT Long Term Goals - 02/24/19 1840      PT LONG TERM GOAL #1   Title  Pt will be ind with advanced HEP    Time  4    Period  Months    Status  New    Target Date  06/26/19      PT LONG TERM GOAL #2   Title  ability to use the larges dilator due to the expansion of the vaginal canal    Time  4    Period  Months    Status  New    Target Date  06/26/19      PT LONG TERM GOAL #3   Title  urinary leakage daily decreased >/= 60% due to improved pelvic floor strength and coordination    Time  4    Period  Months    Status  New    Target Date  06/26/19      PT LONG TERM GOAL #4   Title  fecal leakage decreased >/= 60% due to improve quality of anal contraction and strength    Time  4    Period  Months    Status  New    Target Date  06/26/19      PT LONG TERM GOAL #5   Title  bowel movement diameter is bigger than a nickle size    Time  4    Period  Months    Status  New    Target Date  06/26/19            Plan - 07/24/19 0901    Clinical Impression Statement  Patient reports she has irritation at the vulvar area and she is having some bleeding that may be from the estrogen suppository. Patient has not been using the vaginal dilator due to the pain and irritation. After the manual  work, therapist was able to place her index finger 2/3 into the vaginal canal for first time without pain. Patient was instructed to start with the dilator today and go very slowly. Patient understands ways to manage her skin irritation and pain in the rectum and vaginal using certain creams. Patient will benefit from skilled therapy to improve pelvic floor health, guiding patient to expand her vaginal canal to improve urine and fecal leakage.    Personal Factors and Comorbidities  Age;Comorbidity 1;Comorbidity 2;Comorbidity 3+    Comorbidities  rectocele; osteopoross; s/p anal cancer with radiation and chemotherapy treatment    Examination-Activity  Limitations  Continence;Sit;Toileting    Stability/Clinical Decision Making  Evolving/Moderate complexity    Rehab Potential  Excellent    Clinical Impairments Affecting Rehab Potential  anal cancer    PT Frequency  1x / week    PT Duration  --   4 months   PT Treatment/Interventions  Biofeedback;Neuromuscular re-education;Therapeutic exercise;Therapeutic activities;Patient/family education;Manual techniques;Passive range of motion;Energy conservation    PT Next Visit Plan  abdominal massage; fascial work to the vagina, hip stretches; see how the dilator is doing; check on burning with urination, check on anal opening    PT Home Exercise Plan  Access Code: [email or web address]    Consulted and Agree with Plan of Care  Patient       Patient will benefit from skilled therapeutic intervention in order to improve the following deficits and impairments:  Decreased skin integrity, Increased fascial restricitons, Pain, Decreased coordination, Decreased mobility, Decreased scar mobility, Increased muscle spasms, Decreased activity tolerance, Decreased endurance, Decreased strength  Visit Diagnosis: Cramp and spasm  Muscle weakness (generalized)  Unspecified lack of coordination  Anal cancer [facility])     Problem List Patient Active Problem List   Diagnosis Date Noted  . Anal cancer ([place]) 10/14/2018  . Diabetes ([place]) 09/29/2018  . Genetic testing 03/19/2018  . Family history of breast cancer   . Family history of uterine cancer   . Osteopenia 07/06/2015  . Essential hypertension 07/06/2015  . Atrophic vaginitis 07/06/2015  . Lichen sclerosus et atrophicus of the vulva 07/06/2015    Earlie Counts, PT 07/24/19 10:47 AM   [facility] [address], [place], [place], [address] Phone: [phone]   Fax:  [phone]  Name: BRIANTE MULDREW MRN: [id number] Date of Birth: 05-04-1956

## 2019-07-24 NOTE — Patient Instructions (Addendum)
   [facility] [address], [address] [place], [place] [address] Phone # [phone] Fax [phone]  

## 2019-08-04 ENCOUNTER — Other Ambulatory Visit: Payer: Self-pay

## 2019-08-05 ENCOUNTER — Encounter: Payer: Self-pay | Admitting: Obstetrics and Gynecology

## 2019-08-05 ENCOUNTER — Ambulatory Visit (INDEPENDENT_AMBULATORY_CARE_PROVIDER_SITE_OTHER): Payer: 59 | Admitting: Obstetrics and Gynecology

## 2019-08-05 VITALS — BP 128/62 | HR 80 | Temp 97.1°F | Wt 152.8 lb

## 2019-08-05 DIAGNOSIS — N952 Postmenopausal atrophic vaginitis: Secondary | ICD-10-CM | POA: Diagnosis not present

## 2019-08-05 DIAGNOSIS — L900 Lichen sclerosus et atrophicus: Secondary | ICD-10-CM | POA: Diagnosis not present

## 2019-08-05 DIAGNOSIS — Z85048 Personal history of other malignant neoplasm of rectum, rectosigmoid junction, and anus: Secondary | ICD-10-CM

## 2019-08-05 DIAGNOSIS — Z923 Personal history of irradiation: Secondary | ICD-10-CM | POA: Diagnosis not present

## 2019-08-05 MED ORDER — ESTRADIOL 10 MCG VA TABS: ORAL_TABLET | VAGINAL | 1 refills | Status: DC

## 2019-08-05 NOTE — Progress Notes (Signed)
GYNECOLOGY  VISIT   HPI: 63 y.o.   Married Other or two or more races Not Hispanic or Latino  female   [phone] with Patient's last menstrual period was 11/05/2002 (approximate).   here for f/u of vaginal atrophy. The patient was treated earlier this year for anal cancer and developed severe vaginal atrophy. At the time of her visit last month only a few mm of the pediatric speculum could be placed vaginally. She was started on vaginal estrogen and has vaginal dilators. The vaginal estrogen is helping. She is using vaginal dilators every other night. She feels like she is tearing with the dilators and has some bleeding the next day.  She does bleed rectally from her hemorrhoids. She has a lot of irritation anally.  Lichen sclerosis isn't bothering her very much. She is using steroids perianally intermittent, not for a while. She is using another cream with lidocaine.     GYNECOLOGIC HISTORY: Patient's last menstrual period was 11/05/2002 (approximate). Contraception:Postmenopausal Menopausal hormone therapy: Estradiol vaginal tablets        OB History    Gravida  4   Para  2   Term  2   Preterm  0   AB  2   Living  2     SAB  0   TAB  2   Ectopic  0   Multiple  0   Live Births  2              Patient Active Problem List   Diagnosis Date Noted  . Anal cancer ([place]) 10/14/2018  . Diabetes ([place]) 09/29/2018  . Genetic testing 03/19/2018  . Family history of breast cancer   . Family history of uterine cancer   . Osteopenia 07/06/2015  . Essential hypertension 07/06/2015  . Atrophic vaginitis 07/06/2015  . Lichen sclerosus et atrophicus of the vulva 07/06/2015    Past Medical History:  Diagnosis Date  . Hyperparathyroidism ([place])    resolved after surgery  . Hypertension   . Osteoporosis    of the spine  . Rectocele   . Squamous cell cancer, anus (HCC)   . TMJ syndrome   . Type 2 Diabetes ([place]) 11/2013   controlled with diet/exer    Past Surgical History:   Procedure Laterality Date  . LYMPH NODE BIOPSY    . PARATHYROIDECTOMY  2000  . THERAPEUTIC ABORTION     x 2  . VULVA /PERINEUM BIOPSY  2012    Current Outpatient Medications  Medication Sig Dispense Refill  . aspirin 81 MG tablet Take 81 mg by mouth every evening.     Marland Kitchen atenolol (TENORMIN) 50 MG tablet Take 50 mg by mouth every evening.     . calcium carbonate (TUMS - DOSED IN MG ELEMENTAL CALCIUM) 500 MG chewable tablet Chew 1 tablet by mouth daily as needed for indigestion or heartburn.    . cholecalciferol (VITAMIN D3) 25 MCG (1000 UT) tablet Take 2,000 Units by mouth every evening.    . clobetasol ointment (TEMOVATE) 0.05 % Apply a pea sized amount topically BID x 1-2 weeks prn flare 30 g 1  . Coenzyme Q10 (CO Q 10) 100 MG CAPS Take 100 mg by mouth daily.     Marland Kitchen dicyclomine (BENTYL) 20 MG tablet Take 1 tablet (20 mg total) by mouth 4 (four) times daily -  before meals and at bedtime. 120 tablet 3  . diphenoxylate-atropine (LOMOTIL) 2.5-0.025 MG tablet Take 1-2 tablets by mouth 4 (four) times  daily as needed for diarrhea or loose stools. 30 tablet 1  . Estradiol 10 MCG TABS vaginal tablet One tablet vaginally qhs x 2 weeks, then 2 x a week. 24 tablet 1  . hydrochlorothiazide (HYDRODIURIL) 12.5 MG tablet Take 12.5 mg by mouth daily.     . hydrocortisone (ANUSOL-HC) 25 MG suppository Place 1 suppository (25 mg total) rectally 2 (two) times daily. 12 suppository 1  . ibuprofen (ADVIL,MOTRIN) 100 MG tablet Take 400-600 mg by mouth 2 (two) times daily as needed for pain.    . Magnesium 250 MG TABS Take 250 mg by mouth every evening.    . Multiple Vitamin (MULTIVITAMIN WITH MINERALS) TABS tablet Take 1 tablet by mouth daily.    . silver sulfADIAZINE (SILVADENE) 1 % cream Apply 1 application topically daily. 50 g 0  . SUPER B COMPLEX/C PO Take 1 tablet by mouth daily.    . valACYclovir (VALTREX) 500 MG tablet Take one tab po BID for 3 days with flares. 30 tablet 1  . vitamin C (ASCORBIC ACID)  500 MG tablet Take 500 mg by mouth every evening.     No current facility-administered medications for this visit.      ALLERGIES: Sulfa antibiotics  Family History  Problem Relation Age of Onset  . Breast cancer Mother 19       lumpectomy, x2 in other breast in late 39's  . Endometrial cancer Mother 16  . Breast cancer Maternal Aunt        dx in early 25's, x2 in late 80's  . Breast cancer Paternal Aunt 33  . Breast cancer Paternal Aunt 85  . Cirrhosis Father        ETOH related  . Liver cancer Father   . Lung cancer Father        tobacco user  . Liver disease Father   . Stroke Maternal Grandmother   . Stroke Maternal Grandfather   . Emphysema Paternal Grandmother   . Diabetes Paternal Grandfather   . Breast cancer Cousin 88    Social History   Socioeconomic History  . Marital status: Married    Spouse name: Not on file  . Number of children: Not on file  . Years of education: Not on file  . Highest education level: Not on file  Occupational History  . Not on file  Social Needs  . Financial resource strain: Not on file  . Food insecurity    Worry: Not on file    Inability: Not on file  . Transportation needs    Medical: Not on file    Non-medical: Not on file  Tobacco Use  . Smoking status: Never Smoker  . Smokeless tobacco: Never Used  Substance and Sexual Activity  . Alcohol use: Yes    Alcohol/week: 2.0 standard drinks    Types: 2 Standard drinks or equivalent per week    Comment: 1-2 per week  . Drug use: No  . Sexual activity: Yes    Partners: Male    Birth control/protection: Other-see comments    Comment: vasectomy-husband  Lifestyle  . Physical activity    Days per week: Not on file    Minutes per session: Not on file  . Stress: Not on file  Relationships  . Social Herbalist on phone: Not on file    Gets together: Not on file    Attends religious service: Not on file    Active member of club or organization: Not  on file     Attends meetings of clubs or organizations: Not on file    Relationship status: Not on file  . Intimate partner violence    Fear of current or ex partner: Not on file    Emotionally abused: Not on file    Physically abused: Not on file    Forced sexual activity: Not on file  Other Topics Concern  . Not on file  Social History Narrative  . Not on file    Review of Systems  Constitutional: Negative.   HENT: Negative.   Eyes: Negative.   Respiratory: Negative.   Cardiovascular: Negative.   Gastrointestinal: Negative.   Genitourinary:       Vaginal spotting  Musculoskeletal: Negative.   Skin: Negative.   Neurological: Negative.   Endo/Heme/Allergies: Negative.   Psychiatric/Behavioral: Negative.     PHYSICAL EXAMINATION:    BP 128/62 (BP Location: Right Arm, Patient Position: Sitting, Cuff Size: Normal)   Pulse 80   Temp (!) 97.1 F (36.2 C) (Skin)   Wt 152 lb 12.8 oz (69.3 kg)   LMP 11/05/2002 (Approximate)   BMI 28.87 kg/m     General appearance: alert, cooperative and appears stated age   Pelvic: External genitalia:  no lesions, mild whitening and agglutination, no lesion, no plaques, no fissures              Urethra:  normal appearing urethra with no masses, tenderness or lesions              Bartholins and Skenes: normal                 Vagina: atrophic appearing vagina with focal irritation, friable. Able to insert the pediatric speculum almost all the way, able to insert finger into vagina easily (big improvement)              Cervix: not seen              Bimanual Exam:  Uterus:  normal size, contour, position, consistency, mobility, non-tender              Adnexa: no mass, fullness, tenderness              Perianal region: some diffuse whitening, mild erythema  Chaperone was present for exam.  ASSESSMENT Severe vaginal atrophy s/p radiation for anal cancer. Improvement with vaginal estrogen and vaginal dilators Some vaginal wall bleeding from the  dilators H/O lichen sclerosis, has steroids for prn use, overall doing well    PLAN Increase vaginal estrogen to 3 x a week over the next 6 weeks Continue to work with the vaginal dilators We discussed the optional use of vaginal moisturizers when she isn't using the estrogen      An After Visit Summary was printed and given to the patient.  ~15 minutes face to face time of which over 50% was spent in counseling.

## 2019-08-07 ENCOUNTER — Ambulatory Visit: Payer: 59 | Admitting: Physical Therapy

## 2019-08-21 ENCOUNTER — Encounter: Payer: 59 | Admitting: Physical Therapy

## 2019-09-04 ENCOUNTER — Encounter: Payer: 59 | Admitting: Physical Therapy

## 2019-09-14 NOTE — Progress Notes (Signed)
63 y.o. [id number] Married Other or two or more races Not Hispanic or Latino female here for annual exam.    H/O anal cancer, treated with radiation earlier this year. She has resultant severe vaginal atrophy, was treated with vaginal estrogen and vaginal dilators. She is on one of the bigger dilator, using the vaginal estrogen. She is using the estrogen 3 x a week, will back down to 2 x a week when she is better. Once in a while she will spot with using the dilator, associated with discomfort.   H/O lichen sclerosis, only occasionally bothers her.     FH of breast and uterine cancer, genetic testing negative, she did have a VUS in PMS2 TC risk under 20%  Patient's last menstrual period was 11/05/2002 (approximate).          Sexually active: Yes.    The current method of family planning is post menopausal status.    Exercising: No.  The patient does not participate in regular exercise at present. Smoker:  no  Health Maintenance: Pap: 01/16/2018 WNL NEG HPV,  04-28-14 WNL  History of abnormal Pap:  no MMG:  2019 WNL per patient, scheduled for 10/08/2018 with [facility], scheduled.  Colonoscopy:  Never BMD:   08-02-16 Osteoporosis, declines treatment, dexa next month with primary TDaP:  2012 Gardasil: N/A   reports that she has never smoked. She has never used smokeless tobacco. She reports current alcohol use of about 7.0 standard drinks of alcohol per week. She reports that she does not use drugs. Working as an Glass blower/designer. 2 grown kids, 5 grand children, all local  Past Medical History:  Diagnosis Date  . Hyperparathyroidism ([place])    resolved after surgery  . Hypertension   . Osteoporosis    of the spine  . Rectocele   . Squamous cell cancer, anus (HCC)   . TMJ syndrome   . Type 2 Diabetes ([place]) 11/2013   controlled with diet/exer    Past Surgical History:  Procedure Laterality Date  . LYMPH NODE BIOPSY    . PARATHYROIDECTOMY  2000  . THERAPEUTIC ABORTION     x 2  . VULVA  /PERINEUM BIOPSY  2012    Current Outpatient Medications  Medication Sig Dispense Refill  . aspirin 81 MG tablet Take 81 mg by mouth every evening.     . calcium carbonate (TUMS - DOSED IN MG ELEMENTAL CALCIUM) 500 MG chewable tablet Chew 1 tablet by mouth daily as needed for indigestion or heartburn.    . cholecalciferol (VITAMIN D3) 25 MCG (1000 UT) tablet Take 2,000 Units by mouth every evening.    . clobetasol ointment (TEMOVATE) 0.05 % Apply a pea sized amount topically BID x 1-2 weeks prn flare 30 g 1  . Coenzyme Q10 (CO Q 10) 100 MG CAPS Take 100 mg by mouth daily.     Marland Kitchen dicyclomine (BENTYL) 20 MG tablet Take 1 tablet (20 mg total) by mouth 4 (four) times daily -  before meals and at bedtime. 120 tablet 3  . diphenoxylate-atropine (LOMOTIL) 2.5-0.025 MG tablet Take 1-2 tablets by mouth 4 (four) times daily as needed for diarrhea or loose stools. 30 tablet 1  . Estradiol 10 MCG TABS vaginal tablet One tablet vaginally 3 x a week. 36 tablet 1  . hydrochlorothiazide (HYDRODIURIL) 12.5 MG tablet Take 12.5 mg by mouth daily.     . hydrocortisone (ANUSOL-HC) 25 MG suppository Place 1 suppository (25 mg total) rectally 2 (two) times daily. 12  suppository 1  . ibuprofen (ADVIL,MOTRIN) 100 MG tablet Take 400-600 mg by mouth 2 (two) times daily as needed for pain.    . Magnesium 250 MG TABS Take 250 mg by mouth every evening.    . Multiple Vitamin (MULTIVITAMIN WITH MINERALS) TABS tablet Take 1 tablet by mouth daily.    . silver sulfADIAZINE (SILVADENE) 1 % cream Apply 1 application topically daily. 50 g 0  . SUPER B COMPLEX/C PO Take 1 tablet by mouth daily.    . valACYclovir (VALTREX) 500 MG tablet Take one tab po BID for 3 days with flares. 30 tablet 1  . vitamin C (ASCORBIC ACID) 500 MG tablet Take 500 mg by mouth every evening.    Marland Kitchen atenolol (TENORMIN) 100 MG tablet Take 100 mg by mouth daily.     No current facility-administered medications for this visit.     Family History  Problem  Relation Age of Onset  . Breast cancer Mother 79       lumpectomy, x2 in other breast in late 57's  . Endometrial cancer Mother 26  . Breast cancer Maternal Aunt        dx in early 2's, x2 in late 80's  . Breast cancer Paternal Aunt 35  . Breast cancer Paternal Aunt 85  . Cirrhosis Father        ETOH related  . Liver cancer Father   . Lung cancer Father        tobacco user  . Liver disease Father   . Stroke Maternal Grandmother   . Stroke Maternal Grandfather   . Emphysema Paternal Grandmother   . Diabetes Paternal Grandfather   . Breast cancer Cousin 38    Review of Systems  Constitutional: Negative.   HENT: Negative.   Eyes: Negative.   Respiratory: Negative.   Cardiovascular: Negative.   Gastrointestinal: Negative.   Endocrine: Negative.   Genitourinary: Negative.   Musculoskeletal: Negative.   Skin: Negative.   Allergic/Immunologic: Negative.   Neurological: Negative.   Hematological: Negative.   Psychiatric/Behavioral: Negative.     Exam:   BP 128/80 (BP Location: Right Arm, Patient Position: Sitting, Cuff Size: Normal)   Pulse 84   Temp (!) 97.2 F (36.2 C) (Skin)   Ht 5' 1.25" (1.556 m)   Wt 155 lb 12.8 oz (70.7 kg)   LMP 11/05/2002 (Approximate)   BMI 29.20 kg/m   Weight change: '@WEIGHTCHANGE' @ Height:   Height: 5' 1.25" (155.6 cm)  Ht Readings from Last 3 Encounters:  09/16/19 5' 1.25" (1.556 m)  07/06/19 '5\' 1"'  (1.549 m)  06/29/19 '5\' 1"'  (1.549 m)    General appearance: alert, cooperative and appears stated age Head: Normocephalic, without obvious abnormality, atraumatic Neck: no adenopathy, supple, symmetrical, trachea midline and thyroid normal to inspection and palpation Lungs: clear to auscultation bilaterally Cardiovascular: regular rate and rhythm Breasts: normal appearance, no masses or tenderness Abdomen: soft, non-tender; non distended,  no masses,  no organomegaly Extremities: extremities normal, atraumatic, no cyanosis or edema Skin:  Skin color, texture, turgor normal. No rashes or lesions Lymph nodes: Cervical, supraclavicular, and axillary nodes normal. No abnormal inguinal nodes palpated Neurologic: Grossly normal   Pelvic: External genitalia:  no lesions, mild whitening on the right inner labia majora, loss of architecture on the left.               Urethra:  normal appearing urethra with no masses, tenderness or lesions  Bartholins and Skenes: normal                 Vagina: atrophic appearing vagina with normal color and discharge, no lesions, much improved, able to insert the pediatric speculum the entire way and see her cervix. Able to comfortably insert one finger vaginally.              Cervix: no lesions               Bimanual Exam:  Uterus:  normal size, contour, position, consistency, mobility, non-tender              Adnexa: no mass, fullness, tenderness               Rectovaginal: Confirms               Anus:  normal sphincter tone, no lesions  Chaperone was present for exam.  A:  Well Woman with normal exam  H/O anal cancer   Severe vaginal atrophy and restriction after radiation treatment, improved with vaginal estrogen and dilators  P:   No pap this year  Mammogram and dexa scheduled  Discussed breast self exam  Discussed calcium and vit D intake  Labs with primary  Continue vaginal estrogen 3 x a week, will decrease to 2 x a week when she is better  Valtrex for prn use

## 2019-09-15 ENCOUNTER — Other Ambulatory Visit: Payer: Self-pay

## 2019-09-16 ENCOUNTER — Encounter: Payer: Self-pay | Admitting: Obstetrics and Gynecology

## 2019-09-16 ENCOUNTER — Ambulatory Visit (INDEPENDENT_AMBULATORY_CARE_PROVIDER_SITE_OTHER): Payer: 59 | Admitting: Obstetrics and Gynecology

## 2019-09-16 VITALS — BP 128/80 | HR 84 | Temp 97.2°F | Ht 61.25 in | Wt 155.8 lb

## 2019-09-16 DIAGNOSIS — Z01419 Encounter for gynecological examination (general) (routine) without abnormal findings: Secondary | ICD-10-CM

## 2019-09-16 DIAGNOSIS — Z85048 Personal history of other malignant neoplasm of rectum, rectosigmoid junction, and anus: Secondary | ICD-10-CM

## 2019-09-16 DIAGNOSIS — Z923 Personal history of irradiation: Secondary | ICD-10-CM

## 2019-09-16 DIAGNOSIS — L900 Lichen sclerosus et atrophicus: Secondary | ICD-10-CM | POA: Diagnosis not present

## 2019-09-16 DIAGNOSIS — M810 Age-related osteoporosis without current pathological fracture: Secondary | ICD-10-CM

## 2019-09-16 DIAGNOSIS — Z803 Family history of malignant neoplasm of breast: Secondary | ICD-10-CM

## 2019-09-16 MED ORDER — ESTRADIOL 10 MCG VA TABS: ORAL_TABLET | VAGINAL | 3 refills | Status: DC

## 2019-09-16 MED ORDER — VALACYCLOVIR HCL 500 MG PO TABS: ORAL_TABLET | ORAL | 1 refills | Status: DC

## 2019-09-16 NOTE — Patient Instructions (Signed)

## 2019-09-18 ENCOUNTER — Encounter: Payer: 59 | Admitting: Physical Therapy

## 2019-09-25 ENCOUNTER — Encounter: Payer: Self-pay | Admitting: Physical Therapy

## 2019-09-25 ENCOUNTER — Other Ambulatory Visit: Payer: Self-pay

## 2019-09-25 ENCOUNTER — Ambulatory Visit: Payer: 59 | Attending: Hematology | Admitting: Physical Therapy

## 2019-09-25 DIAGNOSIS — R252 Cramp and spasm: Secondary | ICD-10-CM | POA: Insufficient documentation

## 2019-09-25 DIAGNOSIS — R279 Unspecified lack of coordination: Secondary | ICD-10-CM | POA: Insufficient documentation

## 2019-09-25 DIAGNOSIS — C210 Malignant neoplasm of anus, unspecified: Secondary | ICD-10-CM | POA: Insufficient documentation

## 2019-09-25 DIAGNOSIS — M6281 Muscle weakness (generalized): Secondary | ICD-10-CM | POA: Insufficient documentation

## 2019-09-25 NOTE — Patient Instructions (Signed)
Access Code: [email or web address]  URL: [email or web address]  Date: 09/25/2019  Prepared by: Earlie Counts   Exercises Supine Butterfly Groin Stretch - 1 reps - 1 sets - 1 min hold - 1x daily - 7x weekly Prone Press Up on Elbows - 5 reps - 1 sets - 15 sec 1 min hold - 1x daily - 7x weekly Seated Piriformis Stretch with Trunk Bend - 1 reps - 1 sets - 30 sec hold - 1x daily - 7x weekly Seated Hamstring Stretch - 1 reps - 1 sets - 30 sec hold - 1x daily - 7x weekly Piriformis Mobilization with Small Ball - 10 reps - 1 sets - 1x daily - 7x weekly Seated Pelvic Floor Contraction - 5 reps - 1 sets - 10 sec hold - 3x daily - 7x weekly [facility] [address], [place], [place] [address] Phone # [phone] Fax [phone]

## 2019-09-25 NOTE — Therapy (Signed)
[facility] [address], [place], [place], [address] Phone: [phone]   Fax:  [phone]  Physical Therapy Treatment  Patient Details  Name: Angelica Wright MRN: [id number] Date of Birth: [birth year] Referring Provider (PT): Worthy Flank [facility]   Encounter Date: 09/25/2019  PT End of Session - 09/25/19 1022    Visit Number  6    Date for PT Re-Evaluation  10/25/19    Authorization Type  UHC     Authorization - Visit Number  6    Authorization - Number of Visits  22    PT Start Time  [id number]    PT Stop Time  1055    PT Time Calculation (min)  40 min    Activity Tolerance  Patient tolerated treatment well;No increased pain    Behavior During Therapy  WFL for tasks assessed/performed       Past Medical History:  Diagnosis Date  . Hyperparathyroidism ([place])    resolved after surgery  . Hypertension   . Osteoporosis    of the spine  . Rectocele   . Squamous cell cancer, anus (HCC)   . TMJ syndrome   . Type 2 Diabetes ([place]) 11/2013   controlled with diet/exer    Past Surgical History:  Procedure Laterality Date  . LYMPH NODE BIOPSY    . PARATHYROIDECTOMY  2000  . THERAPEUTIC ABORTION     x 2  . VULVA /PERINEUM BIOPSY  2012    There were no vitals filed for this visit.  Subjective Assessment - 09/25/19 1021    Subjective  I am ready to move onto the fourth dilator and will try at night. I am still using estrogen. When I am moving the dilator inward it is a 7/10 pain. I have more of a burn. I am having 2 bowel movements per day.    Patient Stated Goals  strenthen the pelvic floor; reduce leakage    Currently in Pain?  Yes    Pain Score  5     Pain Location  Rectum    Pain Orientation  Mid    Pain Descriptors / Indicators  Sharp    Pain Type  Acute pain    Pain Onset  More than a month ago    Pain Frequency  Intermittent    Aggravating Factors   depends on the size of the bowel movement    Pain  Relieving Factors  using the desert harvest with relevum for the pain    Multiple Pain Sites  No                    Pelvic Floor Special Questions - 09/25/19 0001    Pelvic Floor Internal Exam  Patient confirms identification and approves PT to assess pelvic floor and treatment    Exam Type  Vaginal    Palpation  was able to place my inex finger all the way in the vaginal canal and perform soft tissue work to the levator ani    Strength  fair squeeze, definite lift        [facility] Adult PT Treatment/Exercise - 09/25/19 0001      Self-Care   Self-Care  Other Self-Care Comments    Other Self-Care Comments   use the desert harvest relevum with dilator, using a moisturizer on days she is not using the estrogen cream      Exercises   Exercises  Other Exercises    Other Exercises  pelvic floor contraction in supine holding for 5 seconds 10x; using a tennsi ball to massage the piriformis      Manual Therapy   Manual Therapy  Internal Pelvic Floor    Internal Pelvic Floor  soft tissue work to the left bulbocavernosus, bilateral  levator ani, bilateral obturator internist             PT Education - 09/25/19 1052    Education Details  Access Code: [email or web address]    Person(s) Educated  Patient    Methods  Explanation;Demonstration;Verbal cues;Handout    Comprehension  Verbalized understanding;Returned demonstration       PT Short Term Goals - 09/25/19 1027      PT SHORT TERM GOAL #2   Title  abiltiy to use the small dilator due to the relaxation of the vaginal tissue    Baseline  not able to due to irritation of the tissue    Time  4    Period  Weeks    Status  Achieved      PT SHORT TERM GOAL #3   Title  burning with urination decreased >/= 25% due to using creams on the vaginal area to protect the skin    Time  4    Period  Weeks    Status  Achieved    Target Date  03/24/19      PT SHORT TERM GOAL #4   Title  abilty to relax the anal canal to increase the  diameter of the anal canal    Time  4    Period  Weeks    Status  Achieved    Target Date  03/24/19        PT Long Term Goals - 02/24/19 1840      PT LONG TERM GOAL #1   Title  Pt will be ind with advanced HEP    Time  4    Period  Months    Status  New    Target Date  06/26/19      PT LONG TERM GOAL #2   Title  ability to use the larges dilator due to the expansion of the vaginal canal    Time  4    Period  Months    Status  New    Target Date  06/26/19      PT LONG TERM GOAL #3   Title  urinary leakage daily decreased >/= 60% due to improved pelvic floor strength and coordination    Time  4    Period  Months    Status  New    Target Date  06/26/19      PT LONG TERM GOAL #4   Title  fecal leakage decreased >/= 60% due to improve quality of anal contraction and strength    Time  4    Period  Months    Status  New    Target Date  06/26/19      PT LONG TERM GOAL #5   Title  bowel movement diameter is bigger than a nickle size    Time  4    Period  Months    Status  New    Target Date  06/26/19            Plan - 09/25/19 1049    Clinical Impression Statement  Patient is on the third dilator and will try the fourth one today. Patient pelvic floor strength is 3/5. Therapist was able to place her  index finger all the way into the vaginal canal and peform soft tissue work to the [employer]. Patient is going to use the [facility] revelum with the dilator due to the burning pain. Patient understands ways to massage the piriformis and using mositurizers vaginally when she is not using the estrogen. Patient will benefit from skilled therapy to improve tissue elongation for using the vaginal dilator.    Personal Factors and Comorbidities  Age;Comorbidity 1;Comorbidity 2;Comorbidity 3+    Comorbidities  rectocele; osteopoross; s/p anal cancer with radiation and chemotherapy treatment    Examination-Activity Limitations  Continence;Sit;Toileting    Stability/Clinical  Decision Making  Evolving/Moderate complexity    Rehab Potential  Excellent    Clinical Impairments Affecting Rehab Potential  anal cancer    PT Frequency  1x / week    PT Duration  Other (comment)   4 months   PT Treatment/Interventions  Biofeedback;Neuromuscular re-education;Therapeutic exercise;Therapeutic activities;Patient/family education;Manual techniques;Passive range of motion;Energy conservation    PT Next Visit Plan  vaginal soft tissue work; possible discharge to HEP    PT Home Exercise Plan  Access Code: [email or web address]    Consulted and Agree with Plan of Care  Patient       Patient will benefit from skilled therapeutic intervention in order to improve the following deficits and impairments:  Decreased skin integrity, Increased fascial restricitons, Pain, Decreased coordination, Decreased mobility, Decreased scar mobility, Increased muscle spasms, Decreased activity tolerance, Decreased endurance, Decreased strength  Visit Diagnosis: Muscle weakness (generalized)  Cramp and spasm  Unspecified lack of coordination  Anal cancer [facility])     Problem List Patient Active Problem List   Diagnosis Date Noted  . Anal cancer ([place]) 10/14/2018  . Diabetes ([place]) 09/29/2018  . Genetic testing 03/19/2018  . Family history of breast cancer   . Family history of uterine cancer   . Osteopenia 07/06/2015  . Essential hypertension 07/06/2015  . Atrophic vaginitis 07/06/2015  . Lichen sclerosus et atrophicus of the vulva 07/06/2015    Earlie Counts, PT 09/25/19 11:00 AM   [facility] [address] [place], [place], [address] Phone: [phone]   Fax:  [phone]  Name: NAHIA WINANS MRN: [id number] Date of Birth: 1955/12/24

## 2019-09-28 ENCOUNTER — Other Ambulatory Visit: Payer: Self-pay | Admitting: Obstetrics and Gynecology

## 2019-10-09 ENCOUNTER — Encounter: Payer: Self-pay | Admitting: Obstetrics & Gynecology

## 2019-10-09 ENCOUNTER — Encounter: Payer: Self-pay | Admitting: Physical Therapy

## 2019-10-09 ENCOUNTER — Other Ambulatory Visit: Payer: Self-pay

## 2019-10-09 ENCOUNTER — Ambulatory Visit: Payer: 59 | Attending: Hematology | Admitting: Physical Therapy

## 2019-10-09 DIAGNOSIS — M6281 Muscle weakness (generalized): Secondary | ICD-10-CM | POA: Diagnosis not present

## 2019-10-09 DIAGNOSIS — R279 Unspecified lack of coordination: Secondary | ICD-10-CM | POA: Diagnosis present

## 2019-10-09 DIAGNOSIS — C210 Malignant neoplasm of anus, unspecified: Secondary | ICD-10-CM | POA: Diagnosis present

## 2019-10-09 DIAGNOSIS — R252 Cramp and spasm: Secondary | ICD-10-CM

## 2019-10-09 NOTE — Therapy (Signed)
[facility] [address], [place], [place], [address] Phone: [phone]   Fax:  [phone]  Physical Therapy Treatment  Patient Details  Name: Angelica Wright MRN: [id number] Date of Birth: 03-Apr-1956 Referring Provider (PT): Worthy Flank [facility]   Encounter Date: 10/09/2019  PT End of Session - 10/09/19 1101    Visit Number  7    Date for PT Re-Evaluation  10/25/19    Authorization Type  UHC     Authorization - Visit Number  7    Authorization - Number of Visits  22    PT Start Time  4650    PT Stop Time  1055    PT Time Calculation (min)  40 min    Activity Tolerance  Patient tolerated treatment well;No increased pain    Behavior During Therapy  WFL for tasks assessed/performed       Past Medical History:  Diagnosis Date  . Hyperparathyroidism ([place])    resolved after surgery  . Hypertension   . Osteoporosis    of the spine  . Rectocele   . Squamous cell cancer, anus (HCC)   . TMJ syndrome   . Type 2 Diabetes ([place]) 11/2013   controlled with diet/exer    Past Surgical History:  Procedure Laterality Date  . LYMPH NODE BIOPSY    . PARATHYROIDECTOMY  2000  . THERAPEUTIC ABORTION     x 2  . VULVA /PERINEUM BIOPSY  2012    There were no vitals filed for this visit.  Subjective Assessment - 10/09/19 1014    Subjective  Every 3-4 days I have a day of bad pain. I am hurting today.    Patient Stated Goals  strenthen the pelvic floor; reduce leakage         [facility] PT Assessment - 10/09/19 0001      Assessment   Medical Diagnosis  C21.0 (ICD-10-CM) - Anal cancer ([place])    Referring Provider (PT)  Worthy Flank [facility]    Prior Therapy  yes      Precautions   Precautions  Other (comment)    Precaution Comments  cancer with radiation and chemo; osteoporosis      Restrictions   Weight Bearing Restrictions  No      [place] residence    Living Arrangements   Spouse/significant other    Type of [place]  Two level      Prior Function   Level of Independence  Independent    Vocation  Part time employment    Engineer, materials    Leisure  taking care of cats, computer games and reading; unable to sit on stationary bike due to pain on anus      Cognition   Overall Cognitive Status  Within Functional Limits for tasks assessed      Observation/Other Assessments   Focus on Therapeutic Outcomes (FOTO)   therapist discrection is 30% limitation due to fecal and urinary leakage, pain with bowel movements, pain with sitting on bike      Sensation   Light Touch  Appears Intact      Posture/Postural Control   Posture/Postural Control  No significant limitations      AROM   Overall AROM Comments  full lumbar ROM      Strength   Right Hip Flexion  5/5    Right Hip External Rotation  5/5    Right Hip Internal Rotation  5/5    Right Hip ABduction  4+/5    Right Hip ADduction  5/5    Left Hip Flexion  5/5    Left Hip Extension  5/5    Left Hip External Rotation  5/5    Left Hip Internal Rotation  5/5    Left Hip ABduction  5/5    Left Hip ADduction  5/5      Palpation   SI assessment   pelvis in correct alignment                Pelvic Floor Special Questions - 10/09/19 0001    Fecal incontinence  Yes    Exam Type  Deferred   very sore today       [facility] Adult PT Treatment/Exercise - 10/09/19 0001      Exercises   Exercises  Other Exercises    Other Exercises   contract 15 sec in sitting 5 times      Lumbar Exercises: Stretches   Press Ups  3 reps;10 seconds    Other Lumbar Stretch Exercise  supine butterfly stretch      Lumbar Exercises: Supine   Dead Bug  5 reps   but hurt right hip   Bridge  15 reps;1 second    Bridge Limitations  pelvic floor contraction    Isometric Hip Flexion  10 reps;5 seconds   with pelvic floor contraction     Shoulder Exercises: Supine   Horizontal  ABduction  Strengthening;Both;10 reps;Theraband    Theraband Level (Shoulder Horizontal ABduction)  Level 1 (Yellow)    Diagonals  Strengthening;Right;Left;10 reps;Theraband    Theraband Level (Shoulder Diagonals)  Level 1 (Yellow)             PT Education - 10/09/19 1100    Education Details  Access Code: [email or web address]; information on osteoporosis    Person(s) Educated  Patient    Methods  Explanation;Demonstration;Verbal cues;Handout    Comprehension  Returned demonstration;Verbalized understanding       PT Short Term Goals - 10/09/19 1102      PT SHORT TERM GOAL #1   Title  independent with initial HEP    Time  4    Period  Days    Status  Achieved      PT SHORT TERM GOAL #2   Title  abiltiy to use the small dilator due to the relaxation of the vaginal tissue    Time  4    Period  Weeks    Status  Achieved      PT SHORT TERM GOAL #3   Title  burning with urination decreased >/= 25% due to using creams on the vaginal area to protect the skin    Period  Weeks    Status  Achieved      PT SHORT TERM GOAL #4   Title  abilty to relax the anal canal to increase the diameter of the anal canal    Time  4    Period  Weeks    Status  Achieved        PT Long Term Goals - 10/09/19 1102      PT LONG TERM GOAL #1   Title  Pt will be ind with advanced HEP    Time  4    Period  Months    Status  Achieved      PT LONG TERM GOAL #2   Title  ability to use the larges dilator due to the expansion of the vaginal canal    Baseline  still on the middle dilator but continues to use it daily    Time  4    Period  Months    Status  Partially Met      PT LONG TERM GOAL #3   Title  urinary leakage daily decreased >/= 60% due to improved pelvic floor strength and coordination    Time  4    Period  Months    Status  Achieved      PT LONG TERM GOAL #4   Title  fecal leakage decreased >/= 60% due to improve quality of anal contraction and strength    Time  4    Period  Months     Status  Achieved      PT LONG TERM GOAL #5   Title  bowel movement diameter is bigger than a nickle size    Time  4    Period  Months    Status  Achieved              Patient will benefit from skilled therapeutic intervention in order to improve the following deficits and impairments:     Visit Diagnosis: Muscle weakness (generalized)  Cramp and spasm  Unspecified lack of coordination  Anal cancer [facility])     Problem List Patient Active Problem List   Diagnosis Date Noted  . Anal cancer ([place]) 10/14/2018  . Diabetes ([place]) 09/29/2018  . Genetic testing 03/19/2018  . Family history of breast cancer   . Family history of uterine cancer   . Osteopenia 07/06/2015  . Essential hypertension 07/06/2015  . Atrophic vaginitis 07/06/2015  . Lichen sclerosus et atrophicus of the vulva 07/06/2015    Earlie Counts, PT 10/09/19 11:04 AM   [facility] [address], [place], [place], [address] Phone: [phone]   Fax:  [phone]  Name: LEVERN KALKA MRN: [id number] Date of Birth: 1956-03-28  PHYSICAL THERAPY DISCHARGE SUMMARY  Visits from Start of Care: 7  Current functional level related to goals / functional outcomes: See above.    Remaining deficits: See above.    Education / Equipment: HEP Plan: Patient agrees to discharge.  Patient goals were partially met. Patient is being discharged due to meeting the stated rehab goals.  Thank you for the referral. Earlie Counts, PT 10/09/19 11:05 AM  ?????

## 2019-10-09 NOTE — Patient Instructions (Addendum)
[email or web address]  Helps with [place] people with payment of medication  [place] - look into assistance for payment of the [place]     Look into card to use bathroom quickly                                             DO's and DON'T's   Avoid and/or Minimize positions of forward bending ( flexion)  Side bending and rotation of the trunk  Especially when movements occur together   When your back aches:   Don't sit down   Lie down on your back with a small pillow under your head and one under your knees or as outlined by our therapist. Or, lie in the 90/90 position ( on the floor with your feet and legs on the sofa with knees and hips bent to 90 degrees)  Tying or putting on your shoes:   Don't bend over to tie your shoes or put on socks.  Instead, bring one foot up, cross it over the opposite knee and bend forward (hinge) at the hips to so the task.  Keep your back straight.  If you cannot do this safely, then you need to use long handled assistive devices such as a shoehorn and sock puller.  Exercising:  Don't engage in ballistic types of exercise routines such as high-impact aerobics or jumping rope  Don't do exercises in the gym that bring you forward (abdominal crunches, sit-ups, touching your  toes, knee-to-chest, straight leg raising.)  Follow a regular exercise program that includes a variety of different weight-bearing activities, such as low-impact aerobics, T' ai chi or walking as your physical therapist advises  Do exercises that emphasize return to normal body alignment and strengthening of the muscles that keep your back straight, as outlined in this program or by your therapist  Household tasks:  Don't reach unnecessarily or twist your trunk when mopping, sweeping, vacuuming, raking, making beds, weeding gardens, getting objects ou of cupboards, etc.  Keep your broom, mop, vacuum, or rake close to you and mover your whole body as you move them. Walk over to the  area on which you are working. Arrange kitchen, bathroom, and bedroom shelves so that frequently used items may be reached without excessive bending, twisting, and reaching.  Use a sturdy stool if necessary.  Don't bend from the waist to pick up something up  Off the floor, out of the trunk of your car, or to brush your teeth, wash your face, etc.   Bend at the knees, keeping back straight as possible. Use a reacher if necessary.   Prevention of fracture is the so-called "BOTTOm -Line" in the management of OSTEOPOROSIS. Do not take unnecessary chances in movement. Once a compression fracture occurs, the process is very difficult to control; one fracture is frequently followed by many more.     Osteoporosis   What is Osteoporosis?  - A silent disease in which the skeleton is weakened by decreased bone density. - Characterized by low bone mass, deterioration of bone, and increased risk of fracture postmenopausal (primary) or the result of an identifiable condition/event (secondary) - Commonly found in the wrists, spine, and hips; these are high-risk stress areas and very susceptible to fractures.  The Facts: - There are 1.5 million fractures/year o 500,000 spine; 250,000 hip with over 60,000 nursing home admissions secondary to hip fracture;  and 200,000 wrist - After hip fracture, only 50% of people able to walk independently prior to the fracture return to independent ambulation. - Bone mass: Peaks at age 1-30, and begins declining at age 85-50.   Osteoporosis is defined by the [facility]) as:  NOF/WHO Criteria for Interpreting Results of Bone Density Assessment  Results Diagnosis  Within 1 standard deviation (SD) of young adult mean Normal  Between 1 and -2.5 SD below mean, repeat in 2 years Low bone mass (osteopenia)  Greater than -2.5 SD below mean Osteoporosis  Greater than -2.5 SD below mean and one or more fragility fractures exist Severe Osteoporosis   *Results can be affected by positioning of the body in the DEXA scan, presence of current or old fractures, arthritis, extraneous calcifications.    Osteoporosis is not just a women's disease!  - 30-40% of women will develop osteoporosis - 5-15% of males will develop osteoporosis   What are the risk factors?  1. Female 2. Thin, small frame 3. Caucasian, Asian race 4. Early menopause (<74 years old)/amenorrhea/delayed puberty 61. Old age 44. Family history (fractures, stooped posture)\ 7. Low calcium diet 8. Sedentary lifestyle 9. Alcohol, Caffeine, Smoking 10. Malnutrition, GI Disease 11. Prolonged use of Glucocorticoids (Prednisone), Meds to treat asthma, arthritis, cancers, thyroid, and anti-seizure meds.  How do I know for sure?  Get a BONE DENSITY TEST!  This measures bone loss and it's painless, non-invasive, and only takes 5-10 minutes!  What can I do about it?  ? Decrease your risk factors (alcohol, caffeine, smoking) ? Helpful medications (see next page) ? Adequate Calcium and Vitamin D intake ? Get active! o Proper posture - Sit and stand tall! No slouching or twisting o Weight-Bearing Exercise - walking, stair climbing, elliptical; NO jogging or high-impact exercise. o Resistive Exercise - Cybex weight equipment, Nautilus, dumbbells, therabands  **Be sure to maintain proper alignment when lifting any weight!!  **When using equipment, avoid abdominal exercises which involve "crunching" or curling or twisting the trunk, biceps machines, cross-country machines, moving handlebars, or ANY MACHINE WITH ROTATION OR FORWARD BENDING!!!           Approved Pharmacologic Management of Osteoporosis  Agent Approved for prevention Approved for treatment BMD increased spine/hip Fracture reduction  Estrogen/Hormone Therapy (Estrace, Estratab, Ogen, Premarin, Vivell, Prempro, Femhert, Orthoest) Yes Yes 3-6% 35% spine and hip  Bisphosphonates  (Fosamax, Actonel, Boniva)  Yes Yes 3-8% 35-50% spine and non-spine  Calcitonin (Miacalcin, Calcimar, Fortical) No Yes 0-3% None stated  Raloxifene (Evista) Yes Yes 2-3% 30-55%  Parathyroid Hormone (Forteo) No Yes, only in those at high risk for fracture None stated 53-65%     Recommended Daily Calcium Intakes   Population Group NIH/NOF* (mg elemental calcium)  Children 1-10 years [phone]  Children 11-24 years 53-1500  Men and Women 25-64 years At least 1200  Pregnant/Lactating At least 1200  Postmenopausal women with hormone replacement therapy At least 1200  Postmenopausal women without   hormone replacement therapy At least 1200  Men and women 65 + At least 1200  *In 1987, 1990, 1994, and 2000, the NIH held consensus conferences on osteoporosis and calcium.  This column shows the most recent recommendations regarding calcium intak for preventing and managing osteoporosis.          Calcium Content of Selected Foods  Dairy Foods Calcium Content (mg) Non-Dairy Foods Calcium Content (mg)  Buttermilk, 1 cup 300 Calcium-fortified juice, 1 cup 300  Milk, 1 cup 300 Salmon, canned  with Bones, 2 oz 100  Lactaid milk, 1 cup 300-500 Oysters, raw 13-19 medium 226  Soy milk, 1 cup 200-300 Sardines, canned with bones, 3 oz 372  Yogurt (plain, lowfat) 1 cup 250-300 Shrimp, canned 3 oz 98  Frozen yogurt (fruit) 1 cup 200-600 Collard greens, cooked 1 cup 357  Cheddar, mozzarella, or [place] cheese, 1oz 205 Broccoli, cooked 1 cup 78  Cottage cheese (lowfat) 4 oz 200 Soybeans, cooked 1 cup 131  Part-skim ricotta cheese, 4oz 335 Tofu, 4oz* *  Vanilla ice cream, 1 cup 120-300    *Calcium content of tofu varies depending on processing method; check nutritional label on package for precise calcium content.     Suggested Guidelines for Calcium Supplement Use:  ? Calcium is absorbed most efficiently if taken in small amounts throughout the day.  Always divide the daily dose into smaller amounts if  the total daily dose is 500mg  or more per day.  The body cannot use more than 500mg  Calcium at any one time. ? The use of manufactured supplements is encouraged.  Calcium as bone meal or dolomite may contain lead or other heavy metals as contaminants. ? Calcium supplements should not be taken with high fiber meals or with bulk forming laxatives. ? If calcium carbonate is used as the supplement form, it should be taken with meals to assure that stomach acid production is present to facilitate optimal dissolution and absorption of calcium.  This is important if atrophic gastritis with hypo- or achlorhydria is present, which it is in 20-50% of older individuals. ? It is important to drink plenty of fluids while using the supplement to help reduce problems with side effects like constipation or bloating.  If these symptoms become a problem, switching to another form of supplement may be the answer. ? Another alternative is calcium-fortified foods, including fruit juices, cereals, and breads.  These foods are now marketed with added calcium and may be less likely to cause side effects. ? Those with personal or family histories of kidney stones should be monitored to assure that hypercalcuria does not occur. CALCIUM INTAKE QUIZ  Dairy products are the primary source of calcium for most people.  For a quick estimate of your daily calcium intake, complete the following steps:  1. Use the chart below to determine your daily intake of calcium from diary foods. Servings of dairy per day 1 2 3 4 5 6 7 8   Milligrams (mg) of calcium: 250 [phone] 1250 1500 1750 2000   2.  Enter your total daily calcium intake from dairy foods:     _____mg  3.  Add 350 mg, which is the average for all other dietary sources:                 +            350 mg  4.  The sum of your total daily calcium intake:               ______mg  5.  Enter the recommended calcium intake for your age from the chart below;          ______mg  6.  Enter your daily intake from step 4 above and subtract:                             -        _______mg  7.  The result is  how much additional calcium you need:                                          ______mg      Recommended Daily Calcium Intake  Population Calcium (mg)  Children 1-10 years [phone]  Children 11-24 years 07-1499  Men and women 25-64 At least 1200  Pregnant/Lactating At least 1200  Postmenopausal women with hormone replacement therapy At least 1200  Postmenopausal women without hormone replacement therapy At least 1200  Men and women 65+ At least 1200       SAFETY TIPS FOR FALL PREVENTION   1. Remove throw rugs and make certain carpet edges are securely fastened to the floor.  2. Reduce clutter, especially in traffic areas of the home.   3. Install/maintain sturdy handrails at stairs.  4. Increase wattage of lighting in hallways, bathrooms, kitchens, stairwells, and entrances to home.  5. Use night-lights near bed, in hallways, and in bathroom to improve night safety.  6. Install safety handrails in shower, tub, and around toilet.  Bathtubs and shower stalls should have non-skid surfaces.  7. When you must reach for something high, use a safety step stool, one with wide steps and a friction surface to stand on.  A type equipped with a high handrail is preferred.  8. If a cane or other walking aid has been recommended, use it to help increase your stability.  9. Wear supportive, cushioned, low-heeled shoes.  Avoid "scuffs" (backless bedroom slippers) and high heels.  10. Avoid rushing to answer a phone, doorbell, or anything else!  A portable phone that you can take from room to room with you is a good idea for security and safety.  11. Exercise regularly and stay active!!    [place] [email or web address]   Exercise for Osteoporosis; A Safe and Effective Way to Build Bone Density and Muscle  Strength By: Burnard Hawthorne, M.A.  [facility] [address] [place], [place] [address] Phone # [phone] Fax [phone] [email or web address]  Access Code: [email or web address]  URL: [email or web address]  Date: 10/09/2019  Prepared by: Earlie Counts   Exercises Supine Butterfly Groin Stretch - 1 reps - 1 sets - 1 min hold - 1x daily - 7x weekly Prone Press Up on Elbows - 5 reps - 1 sets - 15 sec 1 min hold - 1x daily - 7x weekly Seated Piriformis Stretch with Trunk Bend - 1 reps - 1 sets - 30 sec hold - 1x daily - 7x weekly Seated Hamstring Stretch - 1 reps - 1 sets - 30 sec hold - 1x daily - 7x weekly Piriformis Mobilization with Small Ball - 10 reps - 1 sets - 1x daily - 7x weekly Seated Pelvic Floor Contraction - 5 reps - 1 sets - 15 sec hold - 3x daily - 7x weekly Supine Shoulder Horizontal Abduction with Resistance - 10 reps - 1 sets - 1x daily - 3x weekly Seated Shoulder Diagonal Pulls with Resistance - 10 reps - 1 sets                            - 1x daily - 3x weekly Supine Bilateral Shoulder External Rotation with Resistance around Wrists - 10 reps - 1 sets - 1x daily - 3x weekly Supine Bridge with Pelvic Floor Contraction - 10  reps - 2 sets - 1x daily - 2x weekly Hooklying Isometric Hip Flexion - 10 reps - 1 sets - 5 sec hold - 1x daily - 3x weekly Patient Education Osteoporosis Handout Osteoporosis

## 2019-10-18 NOTE — Progress Notes (Signed)
[place]   [phone] [phone]   Clinic Follow up Note   Patient Care Team: Shirline Frees, MD as PCP - General (Family Medicine) 10/19/2019  CHIEF COMPLAINT: F/u anal cancer   SUMMARY OF ONCOLOGIC HISTORY: Oncology History  Anal cancer ([place])  03/19/2018 Genetic Testing   Genetic testing performed through [facility]'s Common Hereditary Cancers Panel reported out on 03/10/2018 showed no pathogenic mutations. The Common Hereditary Cancer Panel offered by [facility] includes sequencing and/or deletion duplication testing of the following 47 genes: APC, ATM, AXIN2, BARD1, BMPR1A, BRCA1, BRCA2, BRIP1, CDH1, CDKN2A (p14ARF), CDKN2A (p16INK4a), CKD4, CHEK2, CTNNA1, DICER1, EPCAM (Deletion/duplication testing only), GREM1 (promoter region deletion/duplication testing only), KIT, MEN1, MLH1, MSH2, MSH3, MSH6, MUTYH, NBN, NF1, NHTL1, PALB2, PDGFRA, PMS2, POLD1, POLE, PTEN, RAD50, RAD51C, RAD51D, SDHB, SDHC, SDHD, SMAD4, SMARCA4. STK11, TP53, TSC1, TSC2, and VHL.  The following genes were evaluated for sequence changes only: SDHA and HOXB13 c.251G>A variant only.  A variant of uncertain significance (VUS) in a gene called PMS2 was also noted. c.1399G>A (p.Val467Ile)   08/14/2018 Imaging   MRI PELVIS W WO Contrast 08/14/18 IMPRESSION: 1. Centrally necrotic 1.8 cm enlarged lymph node versus nonspecific soft tissue mass in the left hemipelvis adjacent to the posterior pelvic sidewall. Further evaluation with biopsy and/or PET-CT is recommended.   09/17/2018 Pathology Results   Diagnosis 09/17/18 Lymph node, needle/core biopsy, left pelvic - SQUAMOUS CELL CARCINOMA. - SEE COMMENT. Microscopic Comment   10/09/2018 PET scan   PET 10/09/18  IMPRESSION: 1. 17 mm left pelvic sidewall lesion is markedly hypermetabolic compatible with known malignancy. 2. No additional definite hypermetabolic disease identified in the neck, chest, abdomen, or pelvis. 3. Relatively  diffuse FDG accumulation in the colon without focal increased uptake above background colonic levels in the region of the anus. 4. Focal FDG accumulation identified along the anterior left femoral neck and in the lumbosacral junction. No underlying bone lesions evident at these locations on CT images. Indeterminate finding although metastatic disease cannot be excluded.    10/13/2018 Initial Biopsy   Diagnosis 10/13/18 Anus, biopsy - SQUAMOUS CELL CARCINOMA. - SEE COMMENT.   10/14/2018 Initial Diagnosis   Anal cancer ([name])   10/14/2018 Cancer Staging   Staging form: Anus, AJCC 8th Edition - Clinical stage from 10/14/2018: Stage IIIA (cT2, cN1b, cM0) - Signed by Truitt Merle, MD on 10/14/2018   11/03/2018 - 12/15/2018 Radiation Therapy   Concurrent chemoRt with Dr. Lisbeth Renshaw for 5-6 weeks starting 11/03/18. Plans to complete on 12/15/18   11/03/2018 - 12/15/2018 Chemotherapy   Concurrent chemoRt with Mitomycin and 5-FU on week 1 and week 5 starting 11/03/18. Week 5 chemo on 12/01/18   04/14/2019 PET scan   PET 04/14/19  IMPRESSION: 1. The hypermetabolic 17 mm left pelvic sidewall lesion seen on the previous study has resolved in the interval. No new or progressive hypermetabolic disease in the neck, chest, abdomen, or pelvis on today's study. 2. Hepatic steatosis. 3. Colonic diverticulosis without diverticulitis.       CURRENT THERAPY: Surveillance   INTERVAL HISTORY: [name] returns for surveillance f/u as scheduled. She was last seen on 04/17/19. She had f/u with Dr. Marcello Moores in 07/2019. She is doing well. Denies changes in her appetite or weight. Continues to have mostly diarrhea 2-5 times daily, this is improved over time, stool is now periodically formed. She avoids imodium unless it's a bad day to reduce constipation. Painful BM is associated with known hemorrhoids. No significant rectal bleeding or other pain.  She is using vaginal dilator which is improving intimacy issues. Denies  recent fever, chills, cough, chest pain, dyspnea, leg edema, or neuropathy. She continues f/u with GI, mammo, PAP screening, DEXA, and annual physical.    MEDICAL HISTORY:  Past Medical History:  Diagnosis Date  . Hyperparathyroidism ([place])    resolved after surgery  . Hypertension   . Osteoporosis    of the spine  . Rectocele   . Squamous cell cancer, anus (HCC)   . TMJ syndrome   . Type 2 Diabetes ([place]) 11/2013   controlled with diet/exer    SURGICAL HISTORY: Past Surgical History:  Procedure Laterality Date  . LYMPH NODE BIOPSY    . PARATHYROIDECTOMY  2000  . THERAPEUTIC ABORTION     x 2  . VULVA /PERINEUM BIOPSY  2012    I have reviewed the social history and family history with the patient and they are unchanged from previous note.  ALLERGIES:  is allergic to sulfa antibiotics.  MEDICATIONS:  Current Outpatient Medications  Medication Sig Dispense Refill  . aspirin 81 MG tablet Take 81 mg by mouth every evening. Morning    . atenolol (TENORMIN) 100 MG tablet Take 100 mg by mouth daily.    . calcium carbonate (TUMS - DOSED IN MG ELEMENTAL CALCIUM) 500 MG chewable tablet Chew 1 tablet by mouth daily as needed for indigestion or heartburn. PRN    . cholecalciferol (VITAMIN D3) 25 MCG (1000 UT) tablet Take 2,000 Units by mouth every evening. morning    . clobetasol ointment (TEMOVATE) 0.05 % Apply a pea sized amount topically BID x 1-2 weeks prn flare 30 g 1  . Coenzyme Q10 (CO Q 10) 100 MG CAPS Take 100 mg by mouth daily.     Marland Kitchen dicyclomine (BENTYL) 20 MG tablet Take 1 tablet (20 mg total) by mouth 4 (four) times daily -  before meals and at bedtime. (Patient taking differently: Take 20 mg by mouth 4 (four) times daily -  before meals and at bedtime. PRN) 120 tablet 3  . diphenoxylate-atropine (LOMOTIL) 2.5-0.025 MG tablet Take 1-2 tablets by mouth 4 (four) times daily as needed for diarrhea or loose stools. (Patient taking differently: Take 1-2 tablets by mouth 4 (four)  times daily as needed for diarrhea or loose stools. PRN) 30 tablet 1  . Estradiol 10 MCG TABS vaginal tablet One tablet vaginally 3 x a week. 36 tablet 3  . hydrochlorothiazide (HYDRODIURIL) 12.5 MG tablet Take 12.5 mg by mouth daily.     . hydrocortisone (ANUSOL-HC) 25 MG suppository Place 1 suppository (25 mg total) rectally 2 (two) times daily. (Patient taking differently: Place 25 mg rectally 2 (two) times daily. PRN) 12 suppository 1  . ibuprofen (ADVIL,MOTRIN) 100 MG tablet Take 400-600 mg by mouth 2 (two) times daily as needed for pain.    . Magnesium 250 MG TABS Take 250 mg by mouth every evening. Morning    . Multiple Vitamin (MULTIVITAMIN WITH MINERALS) TABS tablet Take 1 tablet by mouth daily. PRN    . silver sulfADIAZINE (SILVADENE) 1 % cream Apply 1 application topically daily. (Patient taking differently: Apply 1 application topically daily. PRN) 50 g 0  . SUPER B COMPLEX/C PO Take 1 tablet by mouth daily.    . valACYclovir (VALTREX) 500 MG tablet Take one tab po BID for 3 days with flares. 30 tablet 1  . vitamin C (ASCORBIC ACID) 500 MG tablet Take 500 mg by mouth every evening.  No current facility-administered medications for this visit.    PHYSICAL EXAMINATION: ECOG PERFORMANCE STATUS: 0 - Asymptomatic  Vitals:   10/19/19 1116  BP: 136/81  Pulse: 84  Resp: 18  Temp: 98.3 F (36.8 C)  SpO2: 97%   Filed Weights   10/19/19 1116  Weight: 157 lb 6.4 oz (71.4 kg)    GENERAL:alert, no distress and comfortable SKIN: no rash  EYES:  sclera clear LYMPH:  no palpable cervical, supraclavicular, or inguinal lymphadenopathy LUNGS: clear with normal breathing effort HEART: regular rate & rhythm, no lower extremity edema ABDOMEN:abdomen soft, non-tender and normal bowel sounds NEURO: alert & oriented x 3 with fluent speech, no focal motor/sensory deficits RECTAL: external exam reveals mild hyperpigmentation. External hemorrhoid noted. Internal exam reveals normal  sphincter tone, no palpable mass, no blood on glove   LABORATORY DATA:  I have reviewed the data as listed CBC Latest Ref Rng & Units 10/19/2019 04/17/2019 02/16/2019  WBC 4.0 - 10.5 K/uL 5.1 5.2 5.4  Hemoglobin 12.0 - 15.0 g/dL 13.3 12.9 11.3(L)  Hematocrit 36.0 - 46.0 % 39.1 38.4 34.8(L)  Platelets 150 - 400 K/uL 251 261 239     CMP Latest Ref Rng & Units 10/19/2019 04/17/2019 02/16/2019  Glucose 70 - 99 mg/dL 120(H) 91 108(H)  BUN 8 - 23 mg/dL _0 Creatinine 0.44 - 1.00 mg/dL 0.86 0.73 0.79  Sodium 135 - 145 mmol/L 141 141 142  Potassium 3.5 - 5.1 mmol/L 4.6 4.5 4.4  Chloride 98 - 111 mmol/L 107 108 108  CO2 22 - 32 mmol/L _1 Calcium 8.9 - 10.3 mg/dL 9.4 9.7 9.2  Total Protein 6.5 - 8.1 g/dL 7.5 7.5 7.0  Total Bilirubin 0.3 - 1.2 mg/dL 0.5 0.4 0.5  Alkaline Phos 38 - 126 U/L 101 87 81  AST 15 - 41 U/L _2 ALT 0 - 44 U/L 38 29 19      RADIOGRAPHIC STUDIES: I have personally reviewed the radiological images as listed and agreed with the findings in the report. No results found.   ASSESSMENT & PLAN: Angelica Wright is a 63 y.o. female with   1.Anal Cancer,withpositiveleft pelvic lymph node, cT2N1bM0, stage IIIA -Diagnosed in 09/2018, s/p concurrent chemoRT on 12/25/2018 -Her 03/2018 genetic testing was negative except for VUS of gene PMS2. -PET scan from 04/14/19 showed completed radiographic response.  -Ms. Jelinek is clinically doing well. Diarrhea continues to improve since completing chemoRT, no other significant toxicities from treatment.  -labs reviewed, CBC is normal, CMP normal except BG 120 (nonfasting); exam is benign. No clinical concern for recurrence  -we reviewed the surveillance plan. She is overdue for colonoscopy per Dr. Tarri Glenn, patient will call to schedule.  -She will see Dr. Marcello Moores in 01/2020 and Dr. Burr Medico in 04/2020.  -Will repeat surveillance scan in 04/2020 few days before office visit.   2. 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 Avoids imodium to prevent constipation which worsens rectal pain. Overall pain is minimal  -using vaginal dilator which is improving her intimacy and sexuality issues   4.Mild anemia  -Secondary chemotherapy -resolved   PLAN: -labs reviewed -no clinical concern for recurrence, continue cancer surveillance -Surgery f/u in 3 months (Dr. Marcello Moores) -lab, surveillance CT in 6 months, f/u with Dr. Burr Medico few days after scan -Patient to call GI Dr. Tarri Glenn to  schedule colonoscopy  -Continue health maintenance with PCP, GYN   Orders Placed This Encounter  Procedures  . CT Abdomen Pelvis W Contrast    Standing Status:   Future    Standing Expiration Date:   10/18/2020    Order Specific Question:   ** REASON FOR EXAM (FREE TEXT)    Answer:   h/o clinical stage III anal cancer s/p chemoradiation, surveillance    Order Specific Question:   If indicated for the ordered procedure, I authorize the administration of contrast media per Radiology protocol    Answer:   Yes    Order Specific Question:   Preferred imaging location?    Answer:   [facility]    Order Specific Question:   Is Oral Contrast requested for this exam?    Answer:   Yes, Per Radiology protocol    Order Specific Question:   Radiology Contrast Protocol - do NOT remove file path    Answer:   \\[email or web address]  . CT Chest W Contrast    Standing Status:   Future    Standing Expiration Date:   10/18/2020    Order Specific Question:   ** REASON FOR EXAM (FREE TEXT)    Answer:   h/o clinical stage III anal cancer s/p chemoradiation, surveillance    Order Specific Question:   If indicated for the ordered procedure, I authorize the administration of contrast media per Radiology protocol    Answer:   Yes    Order Specific Question:   Preferred imaging location?     Answer:   [facility]    Order Specific Question:   Radiology Contrast Protocol - do NOT remove file path    Answer:   \\[email or web address]    All questions were answered. The patient knows to call the clinic with any problems, questions or concerns. No barriers to learning was detected. I spent 20 minutes counseling the patient face to face. The total time spent in the appointment was 25 minutes and more than 50% was on counseling and review of test results and coordination of care.      Alla Feeling, NP 10/19/19

## 2019-10-19 ENCOUNTER — Telehealth: Payer: Self-pay | Admitting: *Deleted

## 2019-10-19 ENCOUNTER — Other Ambulatory Visit: Payer: Self-pay

## 2019-10-19 ENCOUNTER — Encounter: Payer: Self-pay | Admitting: Nurse Practitioner

## 2019-10-19 ENCOUNTER — Inpatient Hospital Stay: Payer: 59 | Attending: Nurse Practitioner

## 2019-10-19 ENCOUNTER — Telehealth: Payer: Self-pay | Admitting: Nurse Practitioner

## 2019-10-19 ENCOUNTER — Inpatient Hospital Stay (HOSPITAL_BASED_OUTPATIENT_CLINIC_OR_DEPARTMENT_OTHER): Payer: 59 | Admitting: Nurse Practitioner

## 2019-10-19 VITALS — BP 136/81 | HR 84 | Temp 98.3°F | Resp 18 | Ht 61.25 in | Wt 157.4 lb

## 2019-10-19 DIAGNOSIS — N895 Stricture and atresia of vagina: Secondary | ICD-10-CM | POA: Diagnosis not present

## 2019-10-19 DIAGNOSIS — D649 Anemia, unspecified: Secondary | ICD-10-CM | POA: Insufficient documentation

## 2019-10-19 DIAGNOSIS — K6289 Other specified diseases of anus and rectum: Secondary | ICD-10-CM | POA: Insufficient documentation

## 2019-10-19 DIAGNOSIS — C210 Malignant neoplasm of anus, unspecified: Secondary | ICD-10-CM

## 2019-10-19 DIAGNOSIS — Z85048 Personal history of other malignant neoplasm of rectum, rectosigmoid junction, and anus: Secondary | ICD-10-CM | POA: Diagnosis not present

## 2019-10-19 DIAGNOSIS — K5730 Diverticulosis of large intestine without perforation or abscess without bleeding: Secondary | ICD-10-CM | POA: Insufficient documentation

## 2019-10-19 DIAGNOSIS — I10 Essential (primary) hypertension: Secondary | ICD-10-CM | POA: Insufficient documentation

## 2019-10-19 DIAGNOSIS — E119 Type 2 diabetes mellitus without complications: Secondary | ICD-10-CM | POA: Insufficient documentation

## 2019-10-19 LAB — CBC WITH DIFFERENTIAL (CANCER CENTER ONLY)
Abs Immature Granulocytes: 0.03 10*3/uL (ref 0.00–0.07)
Basophils Absolute: 0 10*3/uL (ref 0.0–0.1)
Basophils Relative: 0 %
Eosinophils Absolute: 0 10*3/uL (ref 0.0–0.5)
Eosinophils Relative: 1 %
HCT: 39.1 % (ref 36.0–46.0)
Hemoglobin: 13.3 g/dL (ref 12.0–15.0)
Immature Granulocytes: 1 %
Lymphocytes Relative: 15 %
Lymphs Abs: 0.8 10*3/uL (ref 0.7–4.0)
MCH: 33.9 pg (ref 26.0–34.0)
MCHC: 34 g/dL (ref 30.0–36.0)
MCV: 99.7 fL (ref 80.0–100.0)
Monocytes Absolute: 0.5 10*3/uL (ref 0.1–1.0)
Monocytes Relative: 9 %
Neutro Abs: 3.8 10*3/uL (ref 1.7–7.7)
Neutrophils Relative %: 74 %
Platelet Count: 251 10*3/uL (ref 150–400)
RBC: 3.92 MIL/uL (ref 3.87–5.11)
RDW: 12.5 % (ref 11.5–15.5)
WBC Count: 5.1 10*3/uL (ref 4.0–10.5)
nRBC: 0 % (ref 0.0–0.2)

## 2019-10-19 LAB — CMP (CANCER CENTER ONLY)
ALT: 38 U/L (ref 0–44)
AST: 27 U/L (ref 15–41)
Albumin: 4.2 g/dL (ref 3.5–5.0)
Alkaline Phosphatase: 101 U/L (ref 38–126)
Anion gap: 9 (ref 5–15)
BUN: 21 mg/dL (ref 8–23)
CO2: 25 mmol/L (ref 22–32)
Calcium: 9.4 mg/dL (ref 8.9–10.3)
Chloride: 107 mmol/L (ref 98–111)
Creatinine: 0.86 mg/dL (ref 0.44–1.00)
GFR, Est AFR Am: >60 mL/min (ref >60)
GFR, Estimated: >60 mL/min (ref >60)
Glucose, Bld: 120 mg/dL — ABNORMAL HIGH (ref 70–99)
Potassium: 4.6 mmol/L (ref 3.5–5.1)
Sodium: 141 mmol/L (ref 135–145)
Total Bilirubin: 0.5 mg/dL (ref 0.3–1.2)
Total Protein: 7.5 g/dL (ref 6.5–8.1)

## 2019-10-19 NOTE — Telephone Encounter (Signed)
Scheduled appt per 12/14 los.  Patient declined calendar and avs.

## 2019-10-19 NOTE — Telephone Encounter (Addendum)
Spoke with patient. Advised of 10/09/19 [facility] BMD results per Dr. Sabra Heck. Patient request to schedule MyChart consult with Dr. Talbert Nan. Last AEX was with Dr. Talbert Nan 09/16/19.   MyChart consult scheduled for 11/19/19 at 4:30pm. Patient declined earlier appt.   Copy of BMD results placed on Dr. Gentry Fitz desk for review.   Routing to provider for final review. Patient is agreeable to disposition. Will close encounter.  Cc: Dr. Sabra Heck

## 2019-10-27 NOTE — Telephone Encounter (Signed)
Okay to close encounter?  °

## 2019-11-07 ENCOUNTER — Other Ambulatory Visit: Payer: Self-pay | Admitting: Gastroenterology

## 2019-11-19 ENCOUNTER — Telehealth (INDEPENDENT_AMBULATORY_CARE_PROVIDER_SITE_OTHER): Payer: 59 | Admitting: Obstetrics and Gynecology

## 2019-11-19 ENCOUNTER — Encounter: Payer: Self-pay | Admitting: Obstetrics and Gynecology

## 2019-11-19 DIAGNOSIS — M810 Age-related osteoporosis without current pathological fracture: Secondary | ICD-10-CM | POA: Diagnosis not present

## 2019-11-19 MED ORDER — ALENDRONATE SODIUM 70 MG PO TABS: 70.0000 mg | ORAL_TABLET | ORAL | 3 refills | Status: DC

## 2019-11-19 NOTE — Progress Notes (Signed)
Virtual Visit via Video Note  I connected with Angelica Wright on 11/19/19 at  4:30 PM EST by a video enabled telemedicine application and verified that I am speaking with the correct person using two identifiers.  Location: Patient: Home Provider: Office at [facility]   I discussed the limitations of evaluation and management by telemedicine and the availability of in person appointments. The patient expressed understanding and agreed to proceed.  GYNECOLOGY  VISIT   HPI: 64 y.o.   Married Other or two or more races Not Hispanic or Latino  female   [phone] with Patient's last menstrual period was 11/05/2002 (approximate).   Virtual visit to discuss worsening osteoporosis. DEXA from 10/09/19 showed a low T score of -3 in her left hip (15% decrease). She had a 3% decrease in her right hip and her spine was stable. She is taking 2,000 IU of vit D daily, not taking calcium supplements. She isn't exercising much, can't walk secondary to knee pain. She is going to try to start using a stationary bike.  Her aunt just had a hip fracture.    GYNECOLOGIC HISTORY: Patient's last menstrual period was 11/05/2002 (approximate). Contraception: Menopause. Menopausal hormone therapy: none, just using vaginal estrogen        OB History    Gravida  4   Para  2   Term  2   Preterm  0   AB  2   Living  2     SAB  0   TAB  2   Ectopic  0   Multiple  0   Live Births  2              Patient Active Problem List   Diagnosis Date Noted  . Anal cancer ([place]) 10/14/2018  . Diabetes ([place]) 09/29/2018  . Genetic testing 03/19/2018  . Family history of breast cancer   . Family history of uterine cancer   . Osteopenia 07/06/2015  . Essential hypertension 07/06/2015  . Atrophic vaginitis 07/06/2015  . Lichen sclerosus et atrophicus of the vulva 07/06/2015    Past Medical History:  Diagnosis Date  . Hyperparathyroidism ([place])    resolved after surgery  .  Hypertension   . Osteoporosis    of the spine  . Rectocele   . Squamous cell cancer, anus (HCC)   . TMJ syndrome   . Type 2 Diabetes ([place]) 11/2013   controlled with diet/exer    Past Surgical History:  Procedure Laterality Date  . LYMPH NODE BIOPSY    . PARATHYROIDECTOMY  2000  . THERAPEUTIC ABORTION     x 2  . VULVA /PERINEUM BIOPSY  2012    Current Outpatient Medications  Medication Sig Dispense Refill  . aspirin 81 MG tablet Take 81 mg by mouth every evening. Morning    . atenolol (TENORMIN) 100 MG tablet Take 100 mg by mouth daily.    . calcium carbonate (TUMS - DOSED IN MG ELEMENTAL CALCIUM) 500 MG chewable tablet Chew 1 tablet by mouth daily as needed for indigestion or heartburn. PRN    . cholecalciferol (VITAMIN D3) 25 MCG (1000 UT) tablet Take 2,000 Units by mouth every evening. morning    . clobetasol ointment (TEMOVATE) 0.05 % Apply a pea sized amount topically BID x 1-2 weeks prn flare 30 g 1  . Coenzyme Q10 (CO Q 10) 100 MG CAPS Take 100 mg by mouth daily.     Marland Kitchen dicyclomine (BENTYL) 20 MG tablet  TAKE 1 TABLET (20 MG TOTAL) BY MOUTH 4 (FOUR) TIMES DAILY - BEFORE MEALS AND AT BEDTIME. 120 tablet 3  . diphenoxylate-atropine (LOMOTIL) 2.5-0.025 MG tablet Take 1-2 tablets by mouth 4 (four) times daily as needed for diarrhea or loose stools. (Patient taking differently: Take 1-2 tablets by mouth 4 (four) times daily as needed for diarrhea or loose stools. PRN) 30 tablet 1  . Estradiol 10 MCG TABS vaginal tablet One tablet vaginally 3 x a week. 36 tablet 3  . hydrochlorothiazide (HYDRODIURIL) 12.5 MG tablet Take 12.5 mg by mouth daily.     . hydrocortisone (ANUSOL-HC) 25 MG suppository Place 1 suppository (25 mg total) rectally 2 (two) times daily. (Patient taking differently: Place 25 mg rectally 2 (two) times daily. PRN) 12 suppository 1  . ibuprofen (ADVIL,MOTRIN) 100 MG tablet Take 400-600 mg by mouth 2 (two) times daily as needed for pain.    . Magnesium 250 MG TABS Take  250 mg by mouth every evening. Morning    . Multiple Vitamin (MULTIVITAMIN WITH MINERALS) TABS tablet Take 1 tablet by mouth daily. PRN    . silver sulfADIAZINE (SILVADENE) 1 % cream Apply 1 application topically daily. (Patient taking differently: Apply 1 application topically daily. PRN) 50 g 0  . SUPER B COMPLEX/C PO Take 1 tablet by mouth daily.    . valACYclovir (VALTREX) 500 MG tablet Take one tab po BID for 3 days with flares. 30 tablet 1  . vitamin C (ASCORBIC ACID) 500 MG tablet Take 500 mg by mouth every evening.     No current facility-administered medications for this visit.     ALLERGIES: Sulfa antibiotics  Family History  Problem Relation Age of Onset  . Breast cancer Mother 40       lumpectomy, x2 in other breast in late 39's  . Endometrial cancer Mother 68  . Breast cancer Maternal Aunt        dx in early 52's, x2 in late 80's  . Breast cancer Paternal Aunt 89  . Breast cancer Paternal Aunt 85  . Cirrhosis Father        ETOH related  . Liver cancer Father   . Lung cancer Father        tobacco user  . Liver disease Father   . Stroke Maternal Grandmother   . Stroke Maternal Grandfather   . Emphysema Paternal Grandmother   . Diabetes Paternal Grandfather   . Breast cancer Cousin 54    Social History   Socioeconomic History  . Marital status: Married    Spouse name: Not on file  . Number of children: Not on file  . Years of education: Not on file  . Highest education level: Not on file  Occupational History  . Not on file  Tobacco Use  . Smoking status: Never Smoker  . Smokeless tobacco: Never Used  Substance and Sexual Activity  . Alcohol use: Yes    Alcohol/week: 7.0 standard drinks    Types: 7 Glasses of wine per week  . Drug use: No  . Sexual activity: Yes    Partners: Male    Birth control/protection: Other-see comments    Comment: vasectomy-husband  Other Topics Concern  . Not on file  Social History Narrative  . Not on file   Social  Determinants of Health   Financial Resource Strain:   . Difficulty of Paying Living Expenses: Not on file  Food Insecurity:   . Worried About Charity fundraiser in  the Last Year: Not on file  . Ran Out of Food in the Last Year: Not on file  Transportation Needs:   . Lack of Transportation (Medical): Not asked  . Lack of Transportation (Non-Medical): Not asked  Physical Activity:   . Days of Exercise per Week: Not on file  . Minutes of Exercise per Session: Not on file  Stress:   . Feeling of Stress : Not on file  Social Connections:   . Frequency of Communication with Friends and Family: Not on file  . Frequency of Social Gatherings with Friends and Family: Not on file  . Attends Religious Services: Not on file  . Active Member of Clubs or Organizations: Not on file  . Attends Archivist Meetings: Not on file  . Marital Status: Not on file  Intimate Partner Violence:   . Fear of Current or Ex-Partner: Not on file  . Emotionally Abused: Not on file  . Physically Abused: Not on file  . Sexually Abused: Not on file    ROS  PHYSICAL EXAMINATION:    LMP 11/05/2002 (Approximate)     General appearance: alert, cooperative and appears stated age  ASSESSMENT Worsening osteoporosis.   Recent normal CBC and CMP  PLAN Calcium 1,200 mg a day Recommend that she come in for a Vit D and Phosphorous level Recommend regular exercise Discussed options for treatment and that treatment is recommended Specifically discussed biphosphonates, prolia and evista including risks and side effects She will go on Fosamax, script sent, discussed how to take the Fosamax. She will wait until we get her lab values back prior to starting the Fosamax Will repeat DEXA in 12/22 Discussed not doing activities that increase her risk of falling All of her questions were answered.    I discussed the assessment and treatment plan with the patient. The patient was provided an opportunity to ask  questions and all were answered. The patient agreed with the plan and demonstrated an understanding of the instructions.   The patient was advised to call back or seek an in-person evaluation if the symptoms worsen or if the condition fails to improve as anticipated.    Salvadore Dom, MD

## 2019-11-27 ENCOUNTER — Other Ambulatory Visit (INDEPENDENT_AMBULATORY_CARE_PROVIDER_SITE_OTHER): Payer: 59

## 2019-11-27 ENCOUNTER — Other Ambulatory Visit: Payer: Self-pay

## 2019-11-27 DIAGNOSIS — M810 Age-related osteoporosis without current pathological fracture: Secondary | ICD-10-CM

## 2019-11-28 ENCOUNTER — Other Ambulatory Visit: Payer: Self-pay | Admitting: Obstetrics and Gynecology

## 2019-11-28 DIAGNOSIS — E559 Vitamin D deficiency, unspecified: Secondary | ICD-10-CM

## 2019-11-28 LAB — PHOSPHORUS: Phosphorus: 3.5 mg/dL (ref 3.0–4.3)

## 2019-11-28 LAB — VITAMIN D 25 HYDROXY (VIT D DEFICIENCY, FRACTURES): Vit D, 25-Hydroxy: 23.2 ng/mL — ABNORMAL LOW (ref 30.0–100.0)

## 2019-11-30 ENCOUNTER — Encounter: Payer: Self-pay | Admitting: Obstetrics and Gynecology

## 2019-11-30 ENCOUNTER — Telehealth: Payer: Self-pay | Admitting: Obstetrics and Gynecology

## 2019-11-30 NOTE — Telephone Encounter (Signed)
Spoke to pt. Pt wanting to know how much Calcium to take since having video visit with Dr Talbert Nan on 11/19/2019. Reviewed Web Visit notes, pt instructed to take 1,200mg  of Calcium daily. Pt agreeable. Pt also scheduled 3 month follow up for Vit D level after increasing to 5,000mg  daily on 03/04/2020 at 845am for a lab visit only. Pt agreeable.   Will route to Dr Talbert Nan for review and will close encounter.

## 2019-11-30 NOTE — Telephone Encounter (Signed)
Patient sent the following message through [place]. Routing to triage to assist patient with request.  [place], [place] [facility]  Phone Number: [phone]  Good Morning Dr. Talbert Nan!   Thank you for letting me know my Vitamin D status over the weekend, greatly appreciated.   I am picking up my medication today that you prescribed and I will also be getting more vitamin D. My questions for you is, how much calcium should I also be taking and is there a better type of calcium to take?   Thank you so very much for your reply!   Take care, be well and safe - Angelica Wright

## 2020-02-29 ENCOUNTER — Encounter: Payer: Self-pay | Admitting: Hematology

## 2020-03-04 ENCOUNTER — Other Ambulatory Visit: Payer: Self-pay

## 2020-03-22 ENCOUNTER — Encounter: Payer: Self-pay | Admitting: Nurse Practitioner

## 2020-03-30 ENCOUNTER — Other Ambulatory Visit: Payer: Self-pay

## 2020-04-14 ENCOUNTER — Inpatient Hospital Stay: Payer: 59 | Attending: Hematology

## 2020-04-14 ENCOUNTER — Other Ambulatory Visit: Payer: Self-pay

## 2020-04-14 ENCOUNTER — Ambulatory Visit (HOSPITAL_COMMUNITY)
Admission: RE | Admit: 2020-04-14 | Discharge: 2020-04-14 | Disposition: A | Discharge status: Home / Self Care | Payer: 59 | Source: Ambulatory Visit | Attending: Nurse Practitioner | Admitting: Nurse Practitioner

## 2020-04-14 DIAGNOSIS — M810 Age-related osteoporosis without current pathological fracture: Secondary | ICD-10-CM | POA: Diagnosis not present

## 2020-04-14 DIAGNOSIS — C210 Malignant neoplasm of anus, unspecified: Secondary | ICD-10-CM | POA: Diagnosis not present

## 2020-04-14 DIAGNOSIS — E119 Type 2 diabetes mellitus without complications: Secondary | ICD-10-CM | POA: Diagnosis not present

## 2020-04-14 DIAGNOSIS — Z85048 Personal history of other malignant neoplasm of rectum, rectosigmoid junction, and anus: Secondary | ICD-10-CM | POA: Diagnosis not present

## 2020-04-14 DIAGNOSIS — I10 Essential (primary) hypertension: Secondary | ICD-10-CM | POA: Insufficient documentation

## 2020-04-14 LAB — CBC WITH DIFFERENTIAL (CANCER CENTER ONLY)
Abs Immature Granulocytes: 0.04 10*3/uL (ref 0.00–0.07)
Basophils Absolute: 0 10*3/uL (ref 0.0–0.1)
Basophils Relative: 1 %
Eosinophils Absolute: 0.1 10*3/uL (ref 0.0–0.5)
Eosinophils Relative: 1 %
HCT: 39.8 % (ref 36.0–46.0)
Hemoglobin: 13.5 g/dL (ref 12.0–15.0)
Immature Granulocytes: 1 %
Lymphocytes Relative: 15 %
Lymphs Abs: 0.8 10*3/uL (ref 0.7–4.0)
MCH: 34.5 pg — ABNORMAL HIGH (ref 26.0–34.0)
MCHC: 33.9 g/dL (ref 30.0–36.0)
MCV: 101.8 fL — ABNORMAL HIGH (ref 80.0–100.0)
Monocytes Absolute: 0.5 10*3/uL (ref 0.1–1.0)
Monocytes Relative: 10 %
Neutro Abs: 3.5 10*3/uL (ref 1.7–7.7)
Neutrophils Relative %: 72 %
Platelet Count: 262 10*3/uL (ref 150–400)
RBC: 3.91 MIL/uL (ref 3.87–5.11)
RDW: 12.5 % (ref 11.5–15.5)
WBC Count: 4.9 10*3/uL (ref 4.0–10.5)
nRBC: 0 % (ref 0.0–0.2)

## 2020-04-14 LAB — CMP (CANCER CENTER ONLY)
ALT: 61 U/L — ABNORMAL HIGH (ref 0–44)
AST: 42 U/L — ABNORMAL HIGH (ref 15–41)
Albumin: 4.1 g/dL (ref 3.5–5.0)
Alkaline Phosphatase: 96 U/L (ref 38–126)
Anion gap: 11 (ref 5–15)
BUN: 13 mg/dL (ref 8–23)
CO2: 27 mmol/L (ref 22–32)
Calcium: 9.7 mg/dL (ref 8.9–10.3)
Chloride: 106 mmol/L (ref 98–111)
Creatinine: 0.85 mg/dL (ref 0.44–1.00)
GFR, Est AFR Am: >60 mL/min (ref >60)
GFR, Estimated: >60 mL/min (ref >60)
Glucose, Bld: 138 mg/dL — ABNORMAL HIGH (ref 70–99)
Potassium: 3.9 mmol/L (ref 3.5–5.1)
Sodium: 144 mmol/L (ref 135–145)
Total Bilirubin: 0.5 mg/dL (ref 0.3–1.2)
Total Protein: 7.6 g/dL (ref 6.5–8.1)

## 2020-04-14 MED ORDER — SODIUM CHLORIDE (PF) 0.9 % IJ SOLN
INTRAMUSCULAR | Status: AC
  Filled 2020-04-14: qty 50

## 2020-04-14 MED ORDER — IOHEXOL 300 MG/ML  SOLN
100.0000 mL | Freq: Once | INTRAMUSCULAR | Status: AC | PRN
  Administered 2020-04-14: 100 mL via INTRAVENOUS

## 2020-04-15 ENCOUNTER — Other Ambulatory Visit (INDEPENDENT_AMBULATORY_CARE_PROVIDER_SITE_OTHER): Payer: 59

## 2020-04-15 DIAGNOSIS — E559 Vitamin D deficiency, unspecified: Secondary | ICD-10-CM

## 2020-04-15 NOTE — Progress Notes (Unsigned)
Vit D orders placed.

## 2020-04-16 LAB — VITAMIN D 25 HYDROXY (VIT D DEFICIENCY, FRACTURES): Vit D, 25-Hydroxy: 22.3 ng/mL — ABNORMAL LOW (ref 30.0–100.0)

## 2020-04-18 ENCOUNTER — Other Ambulatory Visit: Payer: Self-pay

## 2020-04-18 ENCOUNTER — Inpatient Hospital Stay: Payer: 59 | Admitting: Hematology

## 2020-04-18 ENCOUNTER — Other Ambulatory Visit: Payer: 59

## 2020-04-18 ENCOUNTER — Encounter: Payer: Self-pay | Admitting: Hematology

## 2020-04-18 ENCOUNTER — Telehealth: Payer: Self-pay

## 2020-04-18 VITALS — BP 149/74 | HR 92 | Temp 97.9°F | Resp 19 | Ht 61.25 in | Wt 163.2 lb

## 2020-04-18 DIAGNOSIS — I10 Essential (primary) hypertension: Secondary | ICD-10-CM | POA: Diagnosis not present

## 2020-04-18 DIAGNOSIS — E119 Type 2 diabetes mellitus without complications: Secondary | ICD-10-CM | POA: Diagnosis not present

## 2020-04-18 DIAGNOSIS — E559 Vitamin D deficiency, unspecified: Secondary | ICD-10-CM

## 2020-04-18 DIAGNOSIS — K760 Fatty (change of) liver, not elsewhere classified: Secondary | ICD-10-CM | POA: Insufficient documentation

## 2020-04-18 DIAGNOSIS — C210 Malignant neoplasm of anus, unspecified: Secondary | ICD-10-CM

## 2020-04-18 NOTE — Telephone Encounter (Signed)
-----   Message from Salvadore Dom, MD sent at 04/18/2020  2:51 PM EDT ----- My last recommendation was to increase her daily vit d intake to 5,000 IU a day. Please confirm that she has been doing this. If she hasn't, she should. If she has been taking that dose, then I would recommend that she go up to 50,000 IU q week x 12 weeks. Repeat vit d level in 3 months.

## 2020-04-18 NOTE — Progress Notes (Signed)
[place]   Telephone:(336) [phone] Fax:(336) [phone]   Clinic Follow up Note   Patient Care Team: Shirline Frees, MD as PCP - General (Family Medicine) Megan Salon, MD as Consulting Physician (Gynecology)  Date of Service:  04/18/2020  CHIEF COMPLAINT: F/u of anal cancer  SUMMARY OF ONCOLOGIC HISTORY: Oncology History  Anal cancer ([place])  03/19/2018 Genetic Testing   Genetic testing performed through [facility]'s Common Hereditary Cancers Panel reported out on 03/10/2018 showed no pathogenic mutations. The Common Hereditary Cancer Panel offered by [facility] includes sequencing and/or deletion duplication testing of the following 47 genes: APC, ATM, AXIN2, BARD1, BMPR1A, BRCA1, BRCA2, BRIP1, CDH1, CDKN2A (p14ARF), CDKN2A (p16INK4a), CKD4, CHEK2, CTNNA1, DICER1, EPCAM (Deletion/duplication testing only), GREM1 (promoter region deletion/duplication testing only), KIT, MEN1, MLH1, MSH2, MSH3, MSH6, MUTYH, NBN, NF1, NHTL1, PALB2, PDGFRA, PMS2, POLD1, POLE, PTEN, RAD50, RAD51C, RAD51D, SDHB, SDHC, SDHD, SMAD4, SMARCA4. STK11, TP53, TSC1, TSC2, and VHL.  The following genes were evaluated for sequence changes only: SDHA and HOXB13 c.251G>A variant only.  A variant of uncertain significance (VUS) in a gene called PMS2 was also noted. c.1399G>A (p.Val467Ile)   08/14/2018 Imaging   MRI PELVIS W WO Contrast 08/14/18 IMPRESSION: 1. Centrally necrotic 1.8 cm enlarged lymph node versus nonspecific soft tissue mass in the left hemipelvis adjacent to the posterior pelvic sidewall. Further evaluation with biopsy and/or PET-CT is recommended.   09/17/2018 Pathology Results   Diagnosis 09/17/18 Lymph node, needle/core biopsy, left pelvic - SQUAMOUS CELL CARCINOMA. - SEE COMMENT. Microscopic Comment   10/09/2018 PET scan   PET 10/09/18  IMPRESSION: 1. 17 mm left pelvic sidewall lesion is markedly hypermetabolic compatible with known malignancy. 2. No additional definite  hypermetabolic disease identified in the neck, chest, abdomen, or pelvis. 3. Relatively diffuse FDG accumulation in the colon without focal increased uptake above background colonic levels in the region of the anus. 4. Focal FDG accumulation identified along the anterior left femoral neck and in the lumbosacral junction. No underlying bone lesions evident at these locations on CT images. Indeterminate finding although metastatic disease cannot be excluded.    10/13/2018 Initial Biopsy   Diagnosis 10/13/18 Anus, biopsy - SQUAMOUS CELL CARCINOMA. - SEE COMMENT.   10/14/2018 Initial Diagnosis   Anal cancer ([place])   10/14/2018 Cancer Staging   Staging form: Anus, AJCC 8th Edition - Clinical stage from 10/14/2018: Stage IIIA (cT2, cN1b, cM0) - Signed by Truitt Merle, MD on 10/14/2018   11/03/2018 - 12/15/2018 Radiation Therapy   Concurrent chemoRt with Dr. Lisbeth Renshaw for 5-6 weeks starting 11/03/18. Plans to complete on 12/15/18   11/03/2018 - 12/15/2018 Chemotherapy   Concurrent chemoRt with Mitomycin and 5-FU on week 1 and week 5 starting 11/03/18. Week 5 chemo on 12/01/18   04/14/2019 PET scan   PET 04/14/19  IMPRESSION: 1. The hypermetabolic 17 mm left pelvic sidewall lesion seen on the previous study has resolved in the interval. No new or progressive hypermetabolic disease in the neck, chest, abdomen, or pelvis on today's study. 2. Hepatic steatosis. 3. Colonic diverticulosis without diverticulitis.     04/14/2020 Imaging   CT CAP w contrast IMPRESSION: 1. Thickening and stranding about the anus, see PET-CT for further detail. No mass lesion grossly on this evaluation. 2. No evidence of metastatic disease in the chest, abdomen or pelvis. 3. Marked hepatic steatosis. Signs of recanalization of the umbilical vein, correlate with any clinical signs of liver disease. This is indicative of portal hypertension but is seen in the absence of  splenomegaly. Note that the portal vein is patent  into the liver. 4. Recanalized umbilical vein with varix at the umbilicus. 5. Aortic atherosclerosis.   Aortic Atherosclerosis (ICD10-I70.0).      CURRENT THERAPY:  Surveillance  INTERVAL HISTORY:  Angelica Wright is here for a follow up of anal cancer. She was last seen by me 1 year ago and seen by NP Lacie 6 months ago in interim. She presents to the clinic alone. She notes she is doing well. She notes she still has mild digestive issues and pain with BM depending on what she eats. She notes only mild blood on her tissue when she wipes, not in toilet. She related this to hemorrhoids. She notes any abdominal pain or bloating is related to what she eats. She notes she is back to work and has adequate energy. She notes she still has vaginal dryness. She uses vaginal dilators. She has completed PT. She notes she received both her COVID19 vaccines. She notes she is on 5000 units Vit D daily.    REVIEW OF SYSTEMS:   Constitutional: Denies fevers, chills or abnormal weight loss Eyes: Denies blurriness of vision Ears, nose, mouth, throat, and face: Denies mucositis or sore throat Respiratory: Denies cough, dyspnea or wheezes Cardiovascular: Denies palpitation, chest discomfort or lower extremity swelling Gastrointestinal:  Denies nausea, heartburn or change in bowel habits (+) Occasional abdominal pain, bloating and pain with BM (+) Occasional blood on tissue with wiping Skin: Denies abnormal skin rashes Lymphatics: Denies new lymphadenopathy or easy bruising Neurological:Denies numbness, tingling or new weaknesses Behavioral/Psych: Mood is stable, no new changes  All other systems were reviewed with the patient and are negative.  MEDICAL HISTORY:  Past Medical History:  Diagnosis Date  . Hyperparathyroidism ([place])    resolved after surgery  . Hypertension   . Osteoporosis    of the spine  . Rectocele   . Squamous cell cancer, anus ([facility])   . TMJ syndrome   . Type 2 Diabetes ([place])  11/2013   controlled with diet/exer    SURGICAL HISTORY: Past Surgical History:  Procedure Laterality Date  . LYMPH NODE BIOPSY    . PARATHYROIDECTOMY  2000  . THERAPEUTIC ABORTION     x 2  . VULVA /PERINEUM BIOPSY  2012    I have reviewed the social history and family history with the patient and they are unchanged from previous note.  ALLERGIES:  is allergic to sulfa antibiotics.  MEDICATIONS:  Current Outpatient Medications  Medication Sig Dispense Refill  . alendronate (FOSAMAX) 70 MG tablet Take 1 tablet (70 mg total) by mouth every 7 (seven) days. Take first thing in am with 6 oz. Water.  Be upright after taking.  Eat nothing for one hour. 12 tablet 3  . aspirin 81 MG tablet Take 81 mg by mouth every evening. Morning    . atenolol (TENORMIN) 100 MG tablet Take 100 mg by mouth daily.    . calcium carbonate (TUMS - DOSED IN MG ELEMENTAL CALCIUM) 500 MG chewable tablet Chew 1 tablet by mouth daily as needed for indigestion or heartburn. PRN    . cholecalciferol (VITAMIN D3) 25 MCG (1000 UT) tablet Take 2,000 Units by mouth every evening. morning    . clobetasol ointment (TEMOVATE) 0.05 % Apply a pea sized amount topically BID x 1-2 weeks prn flare 30 g 1  . Coenzyme Q10 (CO Q 10) 100 MG CAPS Take 100 mg by mouth daily.     Marland Kitchen dicyclomine (  BENTYL) 20 MG tablet TAKE 1 TABLET (20 MG TOTAL) BY MOUTH 4 (FOUR) TIMES DAILY - BEFORE MEALS AND AT BEDTIME. 120 tablet 3  . diphenoxylate-atropine (LOMOTIL) 2.5-0.025 MG tablet Take 1-2 tablets by mouth 4 (four) times daily as needed for diarrhea or loose stools. (Patient taking differently: Take 1-2 tablets by mouth 4 (four) times daily as needed for diarrhea or loose stools. PRN) 30 tablet 1  . Estradiol 10 MCG TABS vaginal tablet One tablet vaginally 3 x a week. 36 tablet 3  . hydrochlorothiazide (HYDRODIURIL) 12.5 MG tablet Take 12.5 mg by mouth daily.     . hydrocortisone (ANUSOL-HC) 25 MG suppository Place 1 suppository (25 mg total)  rectally 2 (two) times daily. (Patient taking differently: Place 25 mg rectally 2 (two) times daily. PRN) 12 suppository 1  . ibuprofen (ADVIL,MOTRIN) 100 MG tablet Take 400-600 mg by mouth 2 (two) times daily as needed for pain.    . Magnesium 250 MG TABS Take 250 mg by mouth every evening. Morning    . Multiple Vitamin (MULTIVITAMIN WITH MINERALS) TABS tablet Take 1 tablet by mouth daily. PRN    . silver sulfADIAZINE (SILVADENE) 1 % cream Apply 1 application topically daily. (Patient taking differently: Apply 1 application topically daily. PRN) 50 g 0  . SUPER B COMPLEX/C PO Take 1 tablet by mouth daily.    . valACYclovir (VALTREX) 500 MG tablet Take one tab po BID for 3 days with flares. 30 tablet 1  . vitamin C (ASCORBIC ACID) 500 MG tablet Take 500 mg by mouth every evening.     No current facility-administered medications for this visit.    PHYSICAL EXAMINATION: ECOG PERFORMANCE STATUS: 1 - Symptomatic but completely ambulatory  Vitals:   04/18/20 1402 04/18/20 1404  BP: (!) 159/86 (!) 149/74  Pulse: 92   Resp: 19   Temp: 97.9 F (36.6 C)   SpO2: 98%    Filed Weights   04/18/20 1402  Weight: 163 lb 3.2 oz (74 kg)    GENERAL:alert, no distress and comfortable SKIN: skin color, texture, turgor are normal, no rashes or significant lesions EYES: normal, Conjunctiva are pink and non-injected, sclera clear  NECK: supple, thyroid normal size, non-tender, without nodularity LYMPH:  no palpable lymphadenopathy in the cervical, axillary  LUNGS: clear to auscultation and percussion with normal breathing effort HEART: regular rate & rhythm and no murmurs and no lower extremity edema ABDOMEN:abdomen soft, non-tender and normal bowel sounds Musculoskeletal:no cyanosis of digits and no clubbing  NEURO: alert & oriented x 3 with fluent speech, no focal motor/sensory deficits RECTAL: (+) Mild external hemorrhoids. No palpable mass. No blood on glove. Benign exam    LABORATORY DATA:  I  have reviewed the data as listed CBC Latest Ref Rng & Units 04/14/2020 10/19/2019 04/17/2019  WBC 4.0 - 10.5 K/uL 4.9 5.1 5.2  Hemoglobin 12.0 - 15.0 g/dL 13.5 13.3 12.9  Hematocrit 36 - 46 % 39.8 39.1 38.4  Platelets 150 - 400 K/uL 262 251 261     CMP Latest Ref Rng & Units 04/14/2020 10/19/2019 04/17/2019  Glucose 70 - 99 mg/dL 138(H) 120(H) 91  BUN 8 - 23 mg/dL '13 21 14  ' Creatinine 0.44 - 1.00 mg/dL 0.85 0.86 0.73  Sodium 135 - 145 mmol/L 144 141 141  Potassium 3.5 - 5.1 mmol/L 3.9 4.6 4.5  Chloride 98 - 111 mmol/L 106 107 108  CO2 22 - 32 mmol/L '27 25 24  ' Calcium 8.9 - 10.3 mg/dL 9.7 9.4  9.7  Total Protein 6.5 - 8.1 g/dL 7.6 7.5 7.5  Total Bilirubin 0.3 - 1.2 mg/dL 0.5 0.5 0.4  Alkaline Phos 38 - 126 U/L 96 101 87  AST 15 - 41 U/L 42(H) 27 25  ALT 0 - 44 U/L 61(H) 38 29      RADIOGRAPHIC STUDIES: I have personally reviewed the radiological images as listed and agreed with the findings in the report. No results found.   ASSESSMENT & PLAN:  Angelica Wright is a 64 y.o. female with    1.Anal Cancer,withpositiveleft pelvic lymph node, cT2N1bM0, stage IIIA -She was diagnosed in 09/2018, and completed concurrent chemoRT on 12/25/2018. She has recovered well.  -Her 03/2018 genetic testing was negative except for VUS of gene PMS2. -Based on 04/14/19 PET she had complete radiographic response -I personally reviewed and discussed her CT CAP from 04/14/20 with pt which showed no evidence of disease and thickening around her anus likely from RT. Scan also shows severe fatty liver.  -She is clinically doing well. Her GI symptoms of bloating, pain and rectal pain is stable and related to what she eats. Labs reviewed, CBC and CMP WNL except MCV 101.8, BG 138, AST 42, ALT 61, Vit D 23.2. Physical Exam unremarkable except mild external hemorrhoids. She does note occasional blood on tissue when she wipes.  -I recommend she increase Vit D to BID.  -She is 1.5 years since her cancer diagnosis.  Will repeat last surveillance scan in 2022. She will continue anal scopes with Dr Marcello Moores.  -F/u in 6 months. She will f/u with Dr Marcello Moores in interim.   2. HTN and DM -Continue medication, not on diabetic medication. She is being managed by her PCP   3.Rectal Pain, Diarrhea, vaginal stenosissecondary to chemoradiation -gradually improved over time -has diarrhea 2-5 times daily, some stool is formed now. Avoids imodium to prevent constipation which worsens rectal pain. Overall pain is minimal  -using vaginal dilator which is improving her intimacy and sexuality issues  -Her GI symptoms of bloating, pain and rectal pain is stable and related to what she eats. Her vaginal stenosis has increased but manageable.    4. Cancer Screenings, Osteoporosis  -Her 10/2019 DEXA showed osteoporosis with lowest T-score -3 at left femur neck.  -I encouraged her to continue yearly mammograms and regular Pap Smears with her Gyn.   5. Liver steatosis  -Seen on 04/14/20 CT CAP. She notes her father did have history of fatty liver as well.  -I discussed her monitoring her cholesterol level with her PCP and working on eating healthy, exercise and work on losing weight.     PLAN: -lab and CT scan reviewed, NED -Lab and F/u in 6 months, she will seee Dr. Marcello Moores in 3 months    No problem-specific Assessment & Plan notes found for this encounter.   No orders of the defined types were placed in this encounter.  All questions were answered. The patient knows to call the clinic with any problems, questions or concerns. No barriers to learning was detected. The total time spent in the appointment was 30 minutes.     Truitt Merle, MD 04/18/2020   I, Joslyn Devon, am acting as scribe for Truitt Merle, MD.   I have reviewed the above documentation for accuracy and completeness, and I agree with the above.

## 2020-04-18 NOTE — Telephone Encounter (Signed)
Left message for pt to return call to triage RN. 

## 2020-04-19 ENCOUNTER — Telehealth: Payer: Self-pay | Admitting: Hematology

## 2020-04-19 NOTE — Telephone Encounter (Signed)
Scheduled appt per 6/14 los. ° °Left a vm of the appt date and time. °

## 2020-04-22 NOTE — Telephone Encounter (Signed)
Left message for pt to return call to triage RN. 

## 2020-04-26 NOTE — Telephone Encounter (Signed)
Spoke with pt. Pt given results and recommendations per Dr Talbert Nan.  Pt states has been taking 10,000 IU vit D by Petra Kuba Made OTC daily x 7 days since 04/17/20 and wanted to see if ok to take this instead of Rx 50000 IU ? Pt states prefers taking over the course of the  week instead of 44628 IU at one time a week.  Advised will review with Dr Talbert Nan and return call to pt. Pt agreeable. Pt states ok to leave detailed message or mychart message if unable to reach by phone.   Routing to Dr Talbert Nan Rx vit D 50000 IU not sent.

## 2020-04-29 NOTE — Telephone Encounter (Signed)
I'm confused, how much vit d was she was taking the day her blood was drawn.

## 2020-04-29 NOTE — Telephone Encounter (Signed)
Left message for pt to return call to triage RN. 

## 2020-05-04 NOTE — Telephone Encounter (Signed)
Left message for pt to return call to triage RN. 

## 2020-05-17 NOTE — Telephone Encounter (Signed)
Left messages for pt to return call at both home and cell numbers.

## 2020-05-19 NOTE — Telephone Encounter (Signed)
Spoke with patient.  Patient has been taking OTC Vit D 2,000 IU daily prior to her lab draw. States she increased to Vit D to 10,000 IU daily after her labs returned.  Patient would prefer taking Vit D 10,000 IU daily instead of RX VitD.  Asking if this will be ok?   Routing to Dr. Talbert Nan.

## 2020-05-20 NOTE — Telephone Encounter (Signed)
Call to patients home number, left detailed message, ok per dpr. Advised as seen below per Dr. Talbert Nan, keep lab appt as scheduled for 07/06/20 at [facility] for Vit D recheck. Return call to office if any additional questions.   Future lab order placed.   Encounter closed.

## 2020-05-20 NOTE — Telephone Encounter (Signed)
Have her continue on the 10,000 IU a day and recheck her vit d level in 3 months

## 2020-06-20 IMAGING — CT NM PET TUM IMG INITIAL (PI) SKULL BASE T - THIGH
1 of 8 series · 1 of 25 positions shown · non-contrast
Comparison: CT scan 07/29/2018.  MRI pelvis 08/13/2018.

CLINICAL DATA: Initial treatment strategy for squamous cell
carcinoma of the anal canal..

EXAM:
NUCLEAR MEDICINE PET SKULL BASE TO THIGH
TECHNIQUE: 8 mCi F-18 FDG was injected intravenously. Full-ring PET imaging was
performed from the skull base to thigh after the radiotracer. CT
data was obtained and used for attenuation correction and anatomic
localization.
Fasting blood glucose: 124 mg/dl

[Series 4: ct sk_thigh 5.0 b31f · axial · 5.0mm · 0.98mm/px · 1 of 178 slices shown]
[im 178/178  brain]
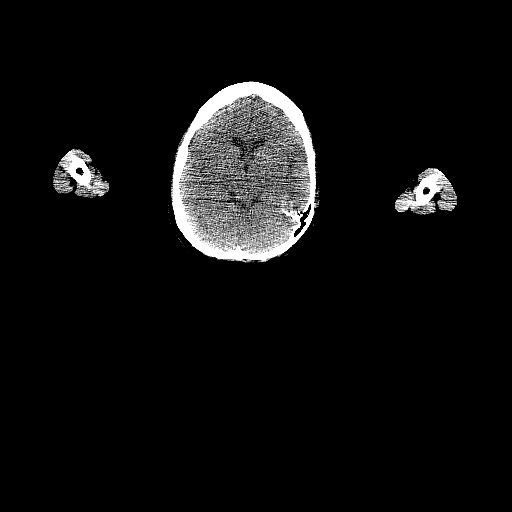

[1 of 25 positions shown; findings below may reference images not displayed]

FINDINGS: Mediastinal blood pool activity: SUV max

NECK: No hypermetabolic lymph nodes in the neck.

Incidental CT findings: none

CHEST: No hypermetabolic mediastinal or hilar nodes. No suspicious
pulmonary nodules on the CT scan.

Incidental CT findings: none

ABDOMEN/PELVIS: No abnormal hypermetabolic activity within the
liver, pancreas, adrenal glands, or spleen. No hypermetabolic lymph
nodes in the abdomen or pelvis.

The known 17 mm left pelvic sidewall lesion is markedly
hypermetabolic with SUV max = 8.5.

Relatively diffuse uptake is identified in the colon, nonspecific
with no convincing increased uptake in the anal region.

Incidental CT findings: Diffuse fatty deposition noted within the
liver parenchyma. 3.9 cm simple cyst identified inferior right liver
adjacent to the gallbladder fossa. Diffuse diverticulosis noted in
the colon without diverticulitis

SKELETON: Mottled FDG accumulation is identified diffusely in the
marrow. There is more focal hypermetabolism along the anterior
cortex of the left femoral neck without underlying bony lesion
evident. Foci of mild hypermetabolism also noted in the posterior L5
vertebral body and along the left L5-S1 interspace.

Incidental CT findings: No worrisome lytic or sclerotic osseous
abnormality.
IMPRESSION: 1. 17 mm left pelvic sidewall lesion is markedly hypermetabolic
compatible with known malignancy.
2. No additional definite hypermetabolic disease identified in the
neck, chest, abdomen, or pelvis.
3. Relatively diffuse FDG accumulation in the colon without focal
increased uptake above background colonic levels in the region of
the anus.
4. Focal FDG accumulation identified along the anterior left femoral
neck and in the lumbosacral junction. No underlying bone lesions
evident at these locations on CT images. Indeterminate finding
although metastatic disease cannot be excluded.

## 2020-07-06 ENCOUNTER — Other Ambulatory Visit (INDEPENDENT_AMBULATORY_CARE_PROVIDER_SITE_OTHER): Payer: 59

## 2020-07-06 ENCOUNTER — Other Ambulatory Visit: Payer: Self-pay

## 2020-07-06 DIAGNOSIS — E559 Vitamin D deficiency, unspecified: Secondary | ICD-10-CM

## 2020-07-07 LAB — VITAMIN D 25 HYDROXY (VIT D DEFICIENCY, FRACTURES): Vit D, 25-Hydroxy: 58.8 ng/mL (ref 30.0–100.0)

## 2020-09-19 ENCOUNTER — Ambulatory Visit: Payer: 59 | Admitting: Obstetrics and Gynecology

## 2020-09-19 NOTE — Progress Notes (Deleted)
64 y.o. [id number] Married Other or two or more races Not Hispanic or Latino female here for annual exam.      Patient's last menstrual period was 11/05/2002 (approximate).          Sexually active: {yes no:314532}  The current method of family planning is {contraception:315051}.    Exercising: {yes no:314532}  {types:19826} Smoker:  {YES [id number]  Health Maintenance: Pap:  01/16/2018 WNL NEG HPV, 04-28-14 WNL History of abnormal Pap:  no MMG:  Bi-rads 1 neg  BMD:   Severe osteoporosis  Colonoscopy: never  TDaP:  2012 Gardasil: NA   reports that she has never smoked. She has never used smokeless tobacco. She reports current alcohol use of about 7.0 standard drinks of alcohol per week. She reports that she does not use drugs.  Past Medical History:  Diagnosis Date  . Hyperparathyroidism ([place])    resolved after surgery  . Hypertension   . Osteoporosis    of the spine  . Rectocele   . Squamous cell cancer, anus ([facility])   . TMJ syndrome   . Type 2 Diabetes ([place]) 11/2013   controlled with diet/exer    Past Surgical History:  Procedure Laterality Date  . LYMPH NODE BIOPSY    . PARATHYROIDECTOMY  2000  . THERAPEUTIC ABORTION     x 2  . VULVA /PERINEUM BIOPSY  2012    Current Outpatient Medications  Medication Sig Dispense Refill  . alendronate (FOSAMAX) 70 MG tablet Take 1 tablet (70 mg total) by mouth every 7 (seven) days. Take first thing in am with 6 oz. Water.  Be upright after taking.  Eat nothing for one hour. 12 tablet 3  . aspirin 81 MG tablet Take 81 mg by mouth every evening. Morning    . atenolol (TENORMIN) 100 MG tablet Take 100 mg by mouth daily.    . calcium carbonate (TUMS - DOSED IN MG ELEMENTAL CALCIUM) 500 MG chewable tablet Chew 1 tablet by mouth daily as needed for indigestion or heartburn. PRN    . cholecalciferol (VITAMIN D3) 25 MCG (1000 UT) tablet Take 2,000 Units by mouth every evening. morning    . clobetasol ointment (TEMOVATE) 0.05 % Apply a pea  sized amount topically BID x 1-2 weeks prn flare 30 g 1  . Coenzyme Q10 (CO Q 10) 100 MG CAPS Take 100 mg by mouth daily.     Marland Kitchen dicyclomine (BENTYL) 20 MG tablet TAKE 1 TABLET (20 MG TOTAL) BY MOUTH 4 (FOUR) TIMES DAILY - BEFORE MEALS AND AT BEDTIME. 120 tablet 3  . diphenoxylate-atropine (LOMOTIL) 2.5-0.025 MG tablet Take 1-2 tablets by mouth 4 (four) times daily as needed for diarrhea or loose stools. (Patient taking differently: Take 1-2 tablets by mouth 4 (four) times daily as needed for diarrhea or loose stools. PRN) 30 tablet 1  . Estradiol 10 MCG TABS vaginal tablet One tablet vaginally 3 x a week. 36 tablet 3  . hydrochlorothiazide (HYDRODIURIL) 12.5 MG tablet Take 12.5 mg by mouth daily.     . hydrocortisone (ANUSOL-HC) 25 MG suppository Place 1 suppository (25 mg total) rectally 2 (two) times daily. (Patient taking differently: Place 25 mg rectally 2 (two) times daily. PRN) 12 suppository 1  . ibuprofen (ADVIL,MOTRIN) 100 MG tablet Take 400-600 mg by mouth 2 (two) times daily as needed for pain.    . Magnesium 250 MG TABS Take 250 mg by mouth every evening. Morning    . Multiple Vitamin (MULTIVITAMIN WITH MINERALS) TABS tablet  Take 1 tablet by mouth daily. PRN    . silver sulfADIAZINE (SILVADENE) 1 % cream Apply 1 application topically daily. (Patient taking differently: Apply 1 application topically daily. PRN) 50 g 0  . SUPER B COMPLEX/C PO Take 1 tablet by mouth daily.    . valACYclovir (VALTREX) 500 MG tablet Take one tab po BID for 3 days with flares. 30 tablet 1  . vitamin C (ASCORBIC ACID) 500 MG tablet Take 500 mg by mouth every evening.     No current facility-administered medications for this visit.    Family History  Problem Relation Age of Onset  . Breast cancer Mother 17       lumpectomy, x2 in other breast in late 67's  . Endometrial cancer Mother 66  . Breast cancer Maternal Aunt        dx in early 68's, x2 in late 80's  . Breast cancer Paternal Aunt 20  . Breast  cancer Paternal Aunt 85  . Cirrhosis Father        ETOH related  . Liver cancer Father   . Lung cancer Father        tobacco user  . Liver disease Father   . Stroke Maternal Grandmother   . Stroke Maternal Grandfather   . Emphysema Paternal Grandmother   . Diabetes Paternal Grandfather   . Breast cancer Cousin 42    Review of Systems  Exam:   LMP 11/05/2002 (Approximate)   Weight change: @WEIGHTCHANGE @ Height:      Ht Readings from Last 3 Encounters:  04/18/20 5' 1.25" (1.556 m)  10/19/19 5' 1.25" (1.556 m)  09/16/19 5' 1.25" (1.556 m)    General appearance: alert, cooperative and appears stated age Head: Normocephalic, without obvious abnormality, atraumatic Neck: no adenopathy, supple, symmetrical, trachea midline and thyroid {CHL AMB PHY EX THYROID NORM DEFAULT:[phone]::"normal to inspection and palpation"} Lungs: clear to auscultation bilaterally Cardiovascular: regular rate and rhythm Breasts: {Exam; breast:13139::"normal appearance, no masses or tenderness"} Abdomen: soft, non-tender; non distended,  no masses,  no organomegaly Extremities: extremities normal, atraumatic, no cyanosis or edema Skin: Skin color, texture, turgor normal. No rashes or lesions Lymph nodes: Cervical, supraclavicular, and axillary nodes normal. No abnormal inguinal nodes palpated Neurologic: Grossly normal   Pelvic: External genitalia:  no lesions              Urethra:  normal appearing urethra with no masses, tenderness or lesions              Bartholins and Skenes: normal                 Vagina: normal appearing vagina with normal color and discharge, no lesions              Cervix: {CHL AMB PHY EX CERVIX NORM DEFAULT:[phone]::"no lesions"}               Bimanual Exam:  Uterus:  {CHL AMB PHY EX UTERUS NORM DEFAULT:[phone]::"normal size, contour, position, consistency, mobility, non-tender"}              Adnexa: {CHL AMB PHY EX ADNEXA NO MASS DEFAULT:[phone]::"no mass,  fullness, tenderness"}               Rectovaginal: Confirms               Anus:  normal sphincter tone, no lesions  *** chaperoned for the exam.  A:  Well Woman with normal exam  P:

## 2020-10-18 ENCOUNTER — Inpatient Hospital Stay: Payer: 59

## 2020-10-18 ENCOUNTER — Inpatient Hospital Stay: Payer: 59 | Attending: Nurse Practitioner | Admitting: Nurse Practitioner

## 2020-10-18 ENCOUNTER — Encounter: Payer: Self-pay | Admitting: Nurse Practitioner

## 2020-10-18 ENCOUNTER — Other Ambulatory Visit: Payer: Self-pay

## 2020-10-18 VITALS — BP 143/72 | HR 71 | Temp 99.4°F | Resp 18 | Ht 61.0 in | Wt 161.1 lb

## 2020-10-18 DIAGNOSIS — C210 Malignant neoplasm of anus, unspecified: Secondary | ICD-10-CM | POA: Diagnosis present

## 2020-10-18 DIAGNOSIS — K760 Fatty (change of) liver, not elsewhere classified: Secondary | ICD-10-CM | POA: Insufficient documentation

## 2020-10-18 DIAGNOSIS — Z79899 Other long term (current) drug therapy: Secondary | ICD-10-CM | POA: Diagnosis not present

## 2020-10-18 DIAGNOSIS — R161 Splenomegaly, not elsewhere classified: Secondary | ICD-10-CM | POA: Insufficient documentation

## 2020-10-18 DIAGNOSIS — M810 Age-related osteoporosis without current pathological fracture: Secondary | ICD-10-CM | POA: Insufficient documentation

## 2020-10-18 DIAGNOSIS — I700 Atherosclerosis of aorta: Secondary | ICD-10-CM | POA: Diagnosis not present

## 2020-10-18 DIAGNOSIS — K6289 Other specified diseases of anus and rectum: Secondary | ICD-10-CM | POA: Diagnosis not present

## 2020-10-18 DIAGNOSIS — Z882 Allergy status to sulfonamides status: Secondary | ICD-10-CM | POA: Diagnosis not present

## 2020-10-18 DIAGNOSIS — E119 Type 2 diabetes mellitus without complications: Secondary | ICD-10-CM | POA: Insufficient documentation

## 2020-10-18 DIAGNOSIS — I10 Essential (primary) hypertension: Secondary | ICD-10-CM | POA: Diagnosis not present

## 2020-10-18 DIAGNOSIS — K5730 Diverticulosis of large intestine without perforation or abscess without bleeding: Secondary | ICD-10-CM | POA: Insufficient documentation

## 2020-10-18 LAB — CBC WITH DIFFERENTIAL (CANCER CENTER ONLY)
Abs Immature Granulocytes: 0.02 10*3/uL (ref 0.00–0.07)
Basophils Absolute: 0 10*3/uL (ref 0.0–0.1)
Basophils Relative: 0 %
Eosinophils Absolute: 0.1 10*3/uL (ref 0.0–0.5)
Eosinophils Relative: 1 %
HCT: 39 % (ref 36.0–46.0)
Hemoglobin: 13.1 g/dL (ref 12.0–15.0)
Immature Granulocytes: 0 %
Lymphocytes Relative: 14 %
Lymphs Abs: 0.8 10*3/uL (ref 0.7–4.0)
MCH: 33.2 pg (ref 26.0–34.0)
MCHC: 33.6 g/dL (ref 30.0–36.0)
MCV: 99 fL (ref 80.0–100.0)
Monocytes Absolute: 0.5 10*3/uL (ref 0.1–1.0)
Monocytes Relative: 8 %
Neutro Abs: 4.4 10*3/uL (ref 1.7–7.7)
Neutrophils Relative %: 77 %
Platelet Count: 260 10*3/uL (ref 150–400)
RBC: 3.94 MIL/uL (ref 3.87–5.11)
RDW: 12.8 % (ref 11.5–15.5)
WBC Count: 5.7 10*3/uL (ref 4.0–10.5)
nRBC: 0 % (ref 0.0–0.2)

## 2020-10-18 LAB — CMP (CANCER CENTER ONLY)
ALT: 43 U/L (ref 0–44)
AST: 29 U/L (ref 15–41)
Albumin: 4.1 g/dL (ref 3.5–5.0)
Alkaline Phosphatase: 95 U/L (ref 38–126)
Anion gap: 7 (ref 5–15)
BUN: 15 mg/dL (ref 8–23)
CO2: 25 mmol/L (ref 22–32)
Calcium: 10 mg/dL (ref 8.9–10.3)
Chloride: 109 mmol/L (ref 98–111)
Creatinine: 0.79 mg/dL (ref 0.44–1.00)
GFR, Estimated: >60 mL/min (ref >60)
Glucose, Bld: 127 mg/dL — ABNORMAL HIGH (ref 70–99)
Potassium: 4.5 mmol/L (ref 3.5–5.1)
Sodium: 141 mmol/L (ref 135–145)
Total Bilirubin: 0.7 mg/dL (ref 0.3–1.2)
Total Protein: 7.7 g/dL (ref 6.5–8.1)

## 2020-10-18 NOTE — Progress Notes (Signed)
[facility]   [phone] Fax:([phone]) [phone]   Clinic Follow up Note   Patient Care Team: Shirline Frees, MD as PCP - General (Family Medicine) Megan Salon, MD as Consulting Physician (Gynecology) Leighton Ruff, MD as Consulting Physician (General Surgery) 10/18/2020  CHIEF COMPLAINT: Follow-up anal cancer  SUMMARY OF ONCOLOGIC HISTORY: Oncology History  Anal cancer ([place])  03/19/2018 Genetic Testing   Genetic testing performed through [facility]'s Common Hereditary Cancers Panel reported out on 03/10/2018 showed no pathogenic mutations. The Common Hereditary Cancer Panel offered by [facility] includes sequencing and/or deletion duplication testing of the following 47 genes: APC, ATM, AXIN2, BARD1, BMPR1A, BRCA1, BRCA2, BRIP1, CDH1, CDKN2A (p14ARF), CDKN2A (p16INK4a), CKD4, CHEK2, CTNNA1, DICER1, EPCAM (Deletion/duplication testing only), GREM1 (promoter region deletion/duplication testing only), KIT, MEN1, MLH1, MSH2, MSH3, MSH6, MUTYH, NBN, NF1, NHTL1, PALB2, PDGFRA, PMS2, POLD1, POLE, PTEN, RAD50, RAD51C, RAD51D, SDHB, SDHC, SDHD, SMAD4, SMARCA4. STK11, TP53, TSC1, TSC2, and VHL.  The following genes were evaluated for sequence changes only: SDHA and HOXB13 c.251G>A variant only.  A variant of uncertain significance (VUS) in a gene called PMS2 was also noted. c.1399G>A (p.Val467Ile)   08/14/2018 Imaging   MRI PELVIS W WO Contrast 08/14/18 IMPRESSION: 1. Centrally necrotic 1.8 cm enlarged lymph node versus nonspecific soft tissue mass in the left hemipelvis adjacent to the posterior pelvic sidewall. Further evaluation with biopsy and/or PET-CT is recommended.   09/17/2018 Pathology Results   Diagnosis 09/17/18 Lymph node, needle/core biopsy, left pelvic - SQUAMOUS CELL CARCINOMA. - SEE COMMENT. Microscopic Comment   10/09/2018 PET scan   PET 10/09/18  IMPRESSION: 1. 17 mm left pelvic sidewall lesion is markedly hypermetabolic compatible with known  malignancy. 2. No additional definite hypermetabolic disease identified in the neck, chest, abdomen, or pelvis. 3. Relatively diffuse FDG accumulation in the colon without focal increased uptake above background colonic levels in the region of the anus. 4. Focal FDG accumulation identified along the anterior left femoral neck and in the lumbosacral junction. No underlying bone lesions evident at these locations on CT images. Indeterminate finding although metastatic disease cannot be excluded.    10/13/2018 Initial Biopsy   Diagnosis 10/13/18 Anus, biopsy - SQUAMOUS CELL CARCINOMA. - SEE COMMENT.   10/14/2018 Initial Diagnosis   Anal cancer ([place])   10/14/2018 Cancer Staging   Staging form: Anus, AJCC 8th Edition - Clinical stage from 10/14/2018: Stage IIIA (cT2, cN1b, cM0) - Signed by Truitt Merle, MD on 10/14/2018   11/03/2018 - 12/15/2018 Radiation Therapy   Concurrent chemoRt with Dr. Lisbeth Renshaw for 5-6 weeks starting 11/03/18. Plans to complete on 12/15/18   11/03/2018 - 12/15/2018 Chemotherapy   Concurrent chemoRt with Mitomycin and 5-FU on week 1 and week 5 starting 11/03/18. Week 5 chemo on 12/01/18   04/14/2019 PET scan   PET 04/14/19  IMPRESSION: 1. The hypermetabolic 17 mm left pelvic sidewall lesion seen on the previous study has resolved in the interval. No new or progressive hypermetabolic disease in the neck, chest, abdomen, or pelvis on today's study. 2. Hepatic steatosis. 3. Colonic diverticulosis without diverticulitis.     04/14/2020 Imaging   CT CAP w contrast IMPRESSION: 1. Thickening and stranding about the anus, see PET-CT for further detail. No mass lesion grossly on this evaluation. 2. No evidence of metastatic disease in the chest, abdomen or pelvis. 3. Marked hepatic steatosis. Signs of recanalization of the umbilical vein, correlate with any clinical signs of liver disease. This is indicative of portal hypertension but is seen in the  absence of  splenomegaly. Note that the portal vein is patent into the liver. 4. Recanalized umbilical vein with varix at the umbilicus. 5. Aortic atherosclerosis.   Aortic Atherosclerosis (ICD10-I70.0).     CURRENT THERAPY: Surveillance  INTERVAL HISTORY: Ms. Engebretsen returns for follow-up as scheduled.  She was last seen by Dr. Burr Medico on 04/18/2020. She saw Dr. Marcello Moores in 07/2020 with a good report. She is doing well, no changes in her health.  Energy and appetite are adequate, denies unintentional weight loss.  She is incorporating more roughage in her diet, continues to have periodic diarrhea.  Usually has a large formed BM in the morning with some blood then progressively gets looser and smaller amount throughout the day.  This is her baseline for a while.  Denies abdominal pain or bloating.  She continues to have vaginal narrowing and painful intercourse, followed by GYN who recommends a dilator and hormone inserts.  Next follow-up in January.  Denies recent fever, chills, cough, chest pain, dyspnea.  She received her COVID-19 vaccine and booster, and flu.  She is up-to-date on Pap and mammogram, plans to schedule colonoscopy with [facility] in early 2022.    MEDICAL HISTORY:  Past Medical History:  Diagnosis Date  . Hyperparathyroidism ([place])    resolved after surgery  . Hypertension   . Osteoporosis    of the spine  . Rectocele   . Squamous cell cancer, anus ([facility])   . TMJ syndrome   . Type 2 Diabetes ([place]) 11/2013   controlled with diet/exer    SURGICAL HISTORY: Past Surgical History:  Procedure Laterality Date  . LYMPH NODE BIOPSY    . PARATHYROIDECTOMY  2000  . THERAPEUTIC ABORTION     x 2  . VULVA /PERINEUM BIOPSY  2012    I have reviewed the social history and family history with the patient and they are unchanged from previous note.  ALLERGIES:  is allergic to sulfa antibiotics.  MEDICATIONS:  Current Outpatient Medications  Medication Sig Dispense Refill  . atenolol (TENORMIN) 100  MG tablet Take 100 mg by mouth daily.    Marland Kitchen atorvastatin (LIPITOR) 20 MG tablet Take 1 tablet by mouth daily.    Marland Kitchen atorvastatin (LIPITOR) 20 MG tablet Take 20 mg by mouth daily.    . Estradiol 10 MCG TABS vaginal tablet One tablet vaginally 3 x a week. 36 tablet 3  . hydrochlorothiazide (HYDRODIURIL) 12.5 MG tablet Take 12.5 mg by mouth daily.     . valACYclovir (VALTREX) 500 MG tablet Take one tab po BID for 3 days with flares. 30 tablet 1  . vitamin C (ASCORBIC ACID) 500 MG tablet Take 500 mg by mouth every evening.    Marland Kitchen alendronate (FOSAMAX) 70 MG tablet Take 1 tablet (70 mg total) by mouth every 7 (seven) days. Take first thing in am with 6 oz. Water.  Be upright after taking.  Eat nothing for one hour. (Patient not taking: Reported on 10/18/2020) 12 tablet 3  . aspirin 81 MG tablet Take 81 mg by mouth every evening. Morning    . calcium carbonate (TUMS - DOSED IN MG ELEMENTAL CALCIUM) 500 MG chewable tablet Chew 1 tablet by mouth daily as needed for indigestion or heartburn. PRN    . cholecalciferol (VITAMIN D3) 25 MCG (1000 UT) tablet Take 2,000 Units by mouth every evening. morning    . clobetasol ointment (TEMOVATE) 0.05 % Apply a pea sized amount topically BID x 1-2 weeks prn flare (Patient not taking: Reported  on 10/18/2020) 30 g 1  . Coenzyme Q10 (CO Q 10) 100 MG CAPS Take 100 mg by mouth daily.     Marland Kitchen dicyclomine (BENTYL) 20 MG tablet TAKE 1 TABLET (20 MG TOTAL) BY MOUTH 4 (FOUR) TIMES DAILY - BEFORE MEALS AND AT BEDTIME. 120 tablet 3  . diphenoxylate-atropine (LOMOTIL) 2.5-0.025 MG tablet Take 1-2 tablets by mouth 4 (four) times daily as needed for diarrhea or loose stools. (Patient taking differently: Take 1-2 tablets by mouth 4 (four) times daily as needed for diarrhea or loose stools. PRN) 30 tablet 1  . hydrocortisone (ANUSOL-HC) 25 MG suppository Place 1 suppository (25 mg total) rectally 2 (two) times daily. (Patient taking differently: Place 25 mg rectally 2 (two) times daily. PRN)  12 suppository 1  . ibuprofen (ADVIL,MOTRIN) 100 MG tablet Take 400-600 mg by mouth 2 (two) times daily as needed for pain. (Patient not taking: Reported on 10/18/2020)    . Magnesium 250 MG TABS Take 250 mg by mouth every evening. Morning    . Multiple Vitamin (MULTIVITAMIN WITH MINERALS) TABS tablet Take 1 tablet by mouth daily. PRN    . silver sulfADIAZINE (SILVADENE) 1 % cream Apply 1 application topically daily. (Patient not taking: Reported on 10/18/2020) 50 g 0  . SUPER B COMPLEX/C PO Take 1 tablet by mouth daily.     No current facility-administered medications for this visit.    PHYSICAL EXAMINATION: ECOG PERFORMANCE STATUS: 0 - Asymptomatic  Vitals:   10/18/20 1156  BP: (!) 143/72  Pulse: 71  Resp: 18  Temp: 99.4 F (37.4 C)  SpO2: 100%   Filed Weights   10/18/20 1156  Weight: 161 lb 1.6 oz (73.1 kg)    GENERAL:alert, no distress and comfortable SKIN: No rash EYES:  sclera clear NECK: Without mass LYMPH:  no palpable cervical, supraclavicular, or inguinal lymphadenopathy  LUNGS:  normal breathing effort HEART:  no lower extremity edema ABDOMEN:abdomen soft, non-tender and normal bowel sounds NEURO: alert & oriented x 3 with fluent speech Rectal: Small external skin tag with mild erythema along the perineum.  Internal anal exam reveals normal sphincter tone, scar tissue at the posterior anal canal, no palpable mass.  No blood on glove  LABORATORY DATA:  I have reviewed the data as listed CBC Latest Ref Rng & Units 10/18/2020 04/14/2020 10/19/2019  WBC 4.0 - 10.5 K/uL 5.7 4.9 5.1  Hemoglobin 12.0 - 15.0 g/dL 13.1 13.5 13.3  Hematocrit 36.0 - 46.0 % 39.0 39.8 39.1  Platelets 150 - 400 K/uL 260 262 251     CMP Latest Ref Rng & Units 10/18/2020 04/14/2020 10/19/2019  Glucose 70 - 99 mg/dL 127(H) 138(H) 120(H)  BUN 8 - 23 mg/dL _0 Creatinine 0.44 - 1.00 mg/dL 0.79 0.85 0.86  Sodium 135 - 145 mmol/L 141 144 141  Potassium 3.5 - 5.1 mmol/L 4.5 3.9 4.6   Chloride 98 - 111 mmol/L 109 106 107  CO2 22 - 32 mmol/L _1 Calcium 8.9 - 10.3 mg/dL 10.0 9.7 9.4  Total Protein 6.5 - 8.1 g/dL 7.7 7.6 7.5  Total Bilirubin 0.3 - 1.2 mg/dL 0.7 0.5 0.5  Alkaline Phos 38 - 126 U/L 95 96 101  AST 15 - 41 U/L 29 42(H) 27  ALT 0 - 44 U/L 43 61(H) 38      RADIOGRAPHIC STUDIES: I have personally reviewed the radiological images as listed and agreed with the findings in the report. No results found.   ASSESSMENT &  PLAN: MALAIKA ARNALL is a 64 y.o. female with    1.Anal Cancer,withpositiveleft pelvic lymph node, cT2N1bM0, stage IIIA -She was diagnosed in 09/2018, S/p concurrent chemoRT on 12/25/2018. PET 04/2019 showed complete radiographic response  -Her 03/2018 genetic testing was negative except for VUS of gene PMS2. -surveillance scans continue to show NED, continue surveillance -continue surveillance and alternating f/up with Dr. Marcello Moores with anoscopy  2. HTN and DM -per PCP   3.Rectal Pain,Diarrhea, vaginal stenosissecondary to chemoradiation -diarrhea resolved. She has painful BM with first stool of the day which is usually the largest/hardest and has some blood -vaginal stenosis managed by Gyn with dilator and hormone inserts   4. Cancer Screenings, Osteoporosis  -Her 10/2019 DEXA showed osteoporosis with lowest T-score -3 at left femur neck.  -UTD on pap and mammogram, patient to call to schedule colonoscopy in early 2022   Disposition: Ms. Severtson is clinically doing well.  Rectal exam is benign.  CBC and CMP are unremarkable.  There is no clinical concern for anal cancer recurrence.  She will continue surveillance. She is reportedly overdue for screening colonoscopy at [facility] in early 2022. Next f/up and anoscopy with Dr. Marcello Moores in 01/2021. Next and likely final surveillance CT in 04/2021.   We will see her back in 6 months, few days after CT.    Orders Placed This Encounter  Procedures  . CT Abdomen Pelvis W Contrast     Standing Status:   Future    Standing Expiration Date:   10/18/2021    Order Specific Question:   If indicated for the ordered procedure, I authorize the administration of contrast media per Radiology protocol    Answer:   Yes    Order Specific Question:   Preferred imaging location?    Answer:   [facility]    Order Specific Question:   Is Oral Contrast requested for this exam?    Answer:   Yes, Per Radiology protocol   All questions were answered. The patient knows to call the clinic with any problems, questions or concerns. No barriers to learning were detected.     Alla Feeling, NP 10/18/20

## 2020-10-19 ENCOUNTER — Telehealth: Payer: Self-pay | Admitting: Nurse Practitioner

## 2020-10-19 NOTE — Telephone Encounter (Signed)
Scheduled appointments per 12/14 los. Called patient, no answer. Left message for patient with appointments dates and times.

## 2020-10-22 ENCOUNTER — Other Ambulatory Visit: Payer: Self-pay | Admitting: Obstetrics and Gynecology

## 2020-10-24 NOTE — Telephone Encounter (Signed)
Medication refill request: Yuvafem Last AEX:  09/16/19 JJ Next AEX: 12/22/20 Last MMG (if hormonal medication request): 10/09/19 BIRADS 1 negative Refill authorized: today, please advise

## 2020-12-04 ENCOUNTER — Other Ambulatory Visit: Payer: Self-pay | Admitting: Obstetrics and Gynecology

## 2020-12-21 NOTE — Progress Notes (Signed)
65 y.o. [id number] Married Other or two or more races Not Hispanic or Latino female here for annual exam.  No vaginal bleeding. Patient states that she is having pain in right breast. The pain started 7-10 days ago. The pain is in the nipple, can be in other areas. She has 1.5 cups of coffee in the am and cup of tea at lunch.     She was given a script for Fosamax in 1/21 for osteoporosis. She never started it. She is willing to start it now.    H/O anal cancer, treated with radiation in 2020. She has resultant severe vaginal atrophy, was treated with vaginal estrogen and vaginal dilators. She has used one of the larger dilators, it was too uncomfortable and would bleed. She is still using vaginal estrogen which helps with comfort. Not able to have  Vaginal penetration. They are sexually active in other ways. Husband wants to have intercourse.  She has a good Social worker.  H/O lichen sclerosis, only occasionally bothers her. Not needing steroid ointments.   She has intermittent diarrhea, can leak some stool. Occurs ~2-3 x a month. Her stool has gotten firmer. Since the anal cancer she typically has 2-3 BM's a day. Needs to be careful with her diet.   She has some mild urge incontinence. She rarely feels herself leaking. Thinks she leaks a couple of drops daily, not a change.     FH of breast and uterine cancer, genetic testing negative, she did have a VUS in PMS2 TC risk under 20%    Patient's last menstrual period was 11/05/2002 (approximate).          Sexually active: yes  The current method of family planning is post menopausal status.    Exercising: No.  The patient does not participate in regular exercise at present. Smoker:  no  Health Maintenance: Pap: 01/16/2018 WNL NEG HPV, 04-28-14 WNL  History of abnormal Pap:  no MMG:  10/09/19 Bi-rads 1 neg  BMD:   10/09/19  Osteoporosis, T score -3.  Colonoscopy: never  TDaP:  2012 Gardasil: none   reports that she has never smoked. She  has never used smokeless tobacco. She reports current alcohol use of about 7.0 standard drinks of alcohol per week. She reports that she does not use drugs. Working as an Glass blower/designer. 2 grown sons, 5 grand children, all local  Past Medical History:  Diagnosis Date  . Hyperparathyroidism ([place])    resolved after surgery  . Hypertension   . Osteoporosis    of the spine  . Rectocele   . Squamous cell cancer, anus ([facility])   . TMJ syndrome   . Type 2 Diabetes ([place]) 11/2013   controlled with diet/exer    Past Surgical History:  Procedure Laterality Date  . LYMPH NODE BIOPSY    . PARATHYROIDECTOMY  2000  . THERAPEUTIC ABORTION     x 2  . VULVA /PERINEUM BIOPSY  2012    Current Outpatient Medications  Medication Sig Dispense Refill  . alendronate (FOSAMAX) 70 MG tablet Take 1 tablet (70 mg total) by mouth every 7 (seven) days. Take first thing in am with 6 oz. Water.  Be upright after taking.  Eat nothing for one hour. (Patient taking differently: Take 70 mg by mouth every 7 (seven) days. Take first thing in am with 6 oz. Water.  Be upright after taking.  Eat nothing for one hour.) 12 tablet 3  . atenolol (TENORMIN) 100 MG tablet Take 100  mg by mouth daily.    Marland Kitchen atorvastatin (LIPITOR) 20 MG tablet Take 1 tablet by mouth daily.    Marland Kitchen atorvastatin (LIPITOR) 20 MG tablet Take 20 mg by mouth daily.    . calcium carbonate (TUMS - DOSED IN MG ELEMENTAL CALCIUM) 500 MG chewable tablet Chew 1 tablet by mouth daily as needed for indigestion or heartburn. PRN    . clobetasol ointment (TEMOVATE) 0.05 % Apply a pea sized amount topically BID x 1-2 weeks prn flare 30 g 1  . Estradiol (YUVAFEM) 10 MCG TABS vaginal tablet Place one tablet vaginally 2 x a week 24 tablet 0  . hydrochlorothiazide (HYDRODIURIL) 12.5 MG tablet Take 12.5 mg by mouth daily.     Marland Kitchen ibuprofen (ADVIL,MOTRIN) 100 MG tablet Take 400-600 mg by mouth 2 (two) times daily as needed for pain.    . silver sulfADIAZINE (SILVADENE) 1 % cream  Apply 1 application topically daily. 50 g 0  . valACYclovir (VALTREX) 500 MG tablet Take one tab po BID for 3 days with flares. 30 tablet 1  . vitamin C (ASCORBIC ACID) 500 MG tablet Take 500 mg by mouth every evening.     No current facility-administered medications for this visit.    Family History  Problem Relation Age of Onset  . Breast cancer Mother 2       lumpectomy, x2 in other breast in late 12's  . Endometrial cancer Mother 65  . Breast cancer Maternal Aunt        dx in early 29's, x2 in late 80's  . Breast cancer Paternal Aunt 39  . Breast cancer Paternal Aunt 85  . Cirrhosis Father        ETOH related  . Liver cancer Father   . Lung cancer Father        tobacco user  . Liver disease Father   . Stroke Maternal Grandmother   . Stroke Maternal Grandfather   . Emphysema Paternal Grandmother   . Diabetes Paternal Grandfather   . Breast cancer Cousin 4    Review of Systems  All other systems reviewed and are negative.   Exam:   BP 130/62   Pulse 76   Ht 5' (1.524 m)   Wt 163 lb (73.9 kg)   LMP 11/05/2002 (Approximate)   SpO2 99%   BMI 31.83 kg/m   Weight change: _0 @ Height:   Height: 5' (152.4 cm)  Ht Readings from Last 3 Encounters:  12/22/20 5' (1.524 m)  10/18/20 5' 1" (1.549 m)  04/18/20 5' 1.25" (1.556 m)    General appearance: alert, cooperative and appears stated age Head: Normocephalic, without obvious abnormality, atraumatic Neck: no adenopathy, supple, symmetrical, trachea midline and thyroid normal to inspection and palpation Lungs: clear to auscultation bilaterally Cardiovascular: regular rate and rhythm Breasts: normal appearance, no masses or tenderness Abdomen: soft, non-tender; non distended,  no masses,  no organomegaly Extremities: extremities normal, atraumatic, no cyanosis or edema Skin: Skin color, texture, turgor normal. No rashes or lesions Lymph nodes: Cervical, supraclavicular, and axillary nodes normal. No abnormal  inguinal nodes palpated Neurologic: Grossly normal   Pelvic: External genitalia:  no lesions, whitening on the inner right labia majora, loss of architecture of the left labia minora, some agglutination of the right labia minora to majora.               Urethra:  normal appearing urethra with no masses, tenderness or lesions  Bartholins and Skenes: normal                 Vagina: mildly atrophic appearing vagina with normal color and discharge, no lesions. Easily able to pass an adolescent speculum and one finger.               Cervix: no lesions               Bimanual Exam:  Uterus:  no masses or tenderness              Adnexa: no mass, fullness, tenderness               Rectovaginal: Confirms               Anus:  normal sphincter tone, no lesions  Gae Dry chaperoned for the exam.  1. Well woman exam Discussed breast self exam Discussed calcium and vit D intake Mammogram due she will schedule (will change to diagnostic imaging if her mastalgia persists for the next 1-2 weeks.    2. Osteoporosis without current pathological fracture, unspecified osteoporosis type She hasn't started on the fosamax, but is willing to.  - alendronate (FOSAMAX) 70 MG tablet; Take 1 tablet (70 mg total) by mouth every 7 (seven) days. Take first thing in am with 6 oz. Water.  Be upright after taking.  Eat nothing for one hour.  Dispense: 12 tablet; Refill: 3 - DG Bone Density; Future  3. History of anal cancer Being followed by Oncology  4. History of radiation therapy   5. Lichen sclerosus Stable, intermittently bothersome. Discuss used of the steroid ointment 1-2 x a week to prevent worsening.  - clobetasol ointment (TEMOVATE) 0.05 %; Apply a pea sized amount topically 1-2 x a week and  BID x 1-2 weeks prn flare  Dispense: 30 g; Refill: 1  6. Vaginal atrophy Helped with vaginal estrogen, but not able to be sexually active. She has vaginal dilators  - Estradiol (YUVAFEM) 10 MCG TABS  vaginal tablet; Place one tablet vaginally 2 x a week  Dispense: 24 tablet; Refill: 3  7. Immunization due  - Tdap vaccine greater than or equal to 7yo IM  8. Fecal soiling due to fecal incontinence Infrequent Information given  9. Mastalgia Normal exam Discussed breast tenderness and treatment options If her symptoms persist for the next few weeks will order diagnostic breast imaging   10. Colon cancer screening - Cologuard ordered, she will check on coverage  11. Screening for cervical cancer  - Cytology - PAP with hpv

## 2020-12-22 ENCOUNTER — Ambulatory Visit: Payer: BC Managed Care – PPO | Admitting: Obstetrics and Gynecology

## 2020-12-22 ENCOUNTER — Encounter: Payer: Self-pay | Admitting: Obstetrics and Gynecology

## 2020-12-22 ENCOUNTER — Other Ambulatory Visit (HOSPITAL_COMMUNITY)
Admission: RE | Admit: 2020-12-22 | Discharge: 2020-12-22 | Disposition: A | Discharge status: Home / Self Care | Payer: 59 | Source: Ambulatory Visit | Attending: Obstetrics and Gynecology | Admitting: Obstetrics and Gynecology

## 2020-12-22 ENCOUNTER — Other Ambulatory Visit: Payer: Self-pay

## 2020-12-22 VITALS — BP 130/62 | HR 76 | Ht 60.0 in | Wt 163.0 lb

## 2020-12-22 DIAGNOSIS — Z124 Encounter for screening for malignant neoplasm of cervix: Secondary | ICD-10-CM

## 2020-12-22 DIAGNOSIS — Z23 Encounter for immunization: Secondary | ICD-10-CM

## 2020-12-22 DIAGNOSIS — R159 Full incontinence of feces: Secondary | ICD-10-CM

## 2020-12-22 DIAGNOSIS — M810 Age-related osteoporosis without current pathological fracture: Secondary | ICD-10-CM

## 2020-12-22 DIAGNOSIS — E559 Vitamin D deficiency, unspecified: Secondary | ICD-10-CM | POA: Insufficient documentation

## 2020-12-22 DIAGNOSIS — M816 Localized osteoporosis [Lequesne]: Secondary | ICD-10-CM | POA: Insufficient documentation

## 2020-12-22 DIAGNOSIS — L900 Lichen sclerosus et atrophicus: Secondary | ICD-10-CM

## 2020-12-22 DIAGNOSIS — Z923 Personal history of irradiation: Secondary | ICD-10-CM

## 2020-12-22 DIAGNOSIS — G4700 Insomnia, unspecified: Secondary | ICD-10-CM | POA: Insufficient documentation

## 2020-12-22 DIAGNOSIS — Z01419 Encounter for gynecological examination (general) (routine) without abnormal findings: Secondary | ICD-10-CM

## 2020-12-22 DIAGNOSIS — E119 Type 2 diabetes mellitus without complications: Secondary | ICD-10-CM | POA: Insufficient documentation

## 2020-12-22 DIAGNOSIS — N644 Mastodynia: Secondary | ICD-10-CM

## 2020-12-22 DIAGNOSIS — G2581 Restless legs syndrome: Secondary | ICD-10-CM | POA: Insufficient documentation

## 2020-12-22 DIAGNOSIS — M1710 Unilateral primary osteoarthritis, unspecified knee: Secondary | ICD-10-CM | POA: Insufficient documentation

## 2020-12-22 DIAGNOSIS — E7800 Pure hypercholesterolemia, unspecified: Secondary | ICD-10-CM | POA: Insufficient documentation

## 2020-12-22 DIAGNOSIS — I700 Atherosclerosis of aorta: Secondary | ICD-10-CM | POA: Insufficient documentation

## 2020-12-22 DIAGNOSIS — N952 Postmenopausal atrophic vaginitis: Secondary | ICD-10-CM

## 2020-12-22 DIAGNOSIS — Z85048 Personal history of other malignant neoplasm of rectum, rectosigmoid junction, and anus: Secondary | ICD-10-CM

## 2020-12-22 DIAGNOSIS — R3129 Other microscopic hematuria: Secondary | ICD-10-CM | POA: Insufficient documentation

## 2020-12-22 DIAGNOSIS — E209 Hypoparathyroidism, unspecified: Secondary | ICD-10-CM | POA: Insufficient documentation

## 2020-12-22 DIAGNOSIS — R600 Localized edema: Secondary | ICD-10-CM | POA: Insufficient documentation

## 2020-12-22 DIAGNOSIS — F419 Anxiety disorder, unspecified: Secondary | ICD-10-CM | POA: Insufficient documentation

## 2020-12-22 DIAGNOSIS — Z1211 Encounter for screening for malignant neoplasm of colon: Secondary | ICD-10-CM

## 2020-12-22 DIAGNOSIS — M179 Osteoarthritis of knee, unspecified: Secondary | ICD-10-CM | POA: Insufficient documentation

## 2020-12-22 MED ORDER — VALACYCLOVIR HCL 500 MG PO TABS: ORAL_TABLET | ORAL | 1 refills | Status: DC

## 2020-12-22 MED ORDER — ALENDRONATE SODIUM 70 MG PO TABS: 70.0000 mg | ORAL_TABLET | ORAL | 3 refills | Status: DC

## 2020-12-22 MED ORDER — CLOBETASOL PROPIONATE 0.05 % EX OINT: TOPICAL_OINTMENT | CUTANEOUS | 1 refills | Status: DC

## 2020-12-22 MED ORDER — ESTRADIOL 10 MCG VA TABS: ORAL_TABLET | VAGINAL | 3 refills | Status: DC

## 2020-12-22 NOTE — Patient Instructions (Addendum)
EXERCISE   We recommended that you start or continue a regular exercise program for good health. Physical activity is anything that gets your body moving, some is better than none. The CDC recommends 150 minutes per week of Moderate-Intensity Aerobic Activity and 2 or more days of Muscle Strengthening Activity.  Benefits of exercise are limitless: helps weight loss/weight maintenance, improves mood and energy, helps with depression and anxiety, improves sleep, tones and strengthens muscles, improves balance, improves bone density, protects from chronic conditions such as heart disease, high blood pressure and diabetes and so much more. To learn more visit: https://www.cdc.gov/physicalactivity/index.html  DIET: Good nutrition starts with a healthy diet of fruits, vegetables, whole grains, and lean protein sources. Drink plenty of water for hydration. Minimize empty calories, sodium, sweets. For more information about dietary recommendations visit: https://health.gov/our-work/nutrition-physical-activity/dietary-guidelines and https://www.myplate.gov/  ALCOHOL:  Women should limit their alcohol intake to no more than 7 drinks/beers/glasses of wine (combined, not each!) per week. Moderation of alcohol intake to this level decreases your risk of breast cancer and liver damage.  If you are concerned that you may have a problem, or your friends have told you they are concerned about your drinking, there are many resources to help. A well-known program that is free, effective, and available to all people all over the nation is Alcoholics Anonymous.  Check out this site to learn more: https://www.aa.org/   CALCIUM AND VITAMIN D:  Adequate intake of calcium and Vitamin D are recommended for bone health.  You should be getting between 1000-1200 mg of calcium and 800 units of Vitamin D daily between diet and supplements  PAP SMEARS:  Pap smears, to check for cervical cancer or precancers,  have traditionally been  done yearly, scientific advances have shown that most women can have pap smears less often.  However, every woman still should have a physical exam from her gynecologist every year. It will include a breast check, inspection of the vulva and vagina to check for abnormal growths or skin changes, a visual exam of the cervix, and then an exam to evaluate the size and shape of the uterus and ovaries. We will also provide age appropriate advice regarding health maintenance, like when you should have certain vaccines, screening for sexually transmitted diseases, bone density testing, colonoscopy, mammograms, etc.   MAMMOGRAMS:  All women over 40 years old should have a routine mammogram.   COLON CANCER SCREENING: Now recommend starting at age 45. At this time colonoscopy is not covered for routine screening until 50. There are take home tests that can be done between 45-49.   COLONOSCOPY:  Colonoscopy to screen for colon cancer is recommended for all women at age 50.  We know, you hate the idea of the prep.  We agree, BUT, having colon cancer and not knowing it is worse!!  Colon cancer so often starts as a polyp that can be seen and removed at colonscopy, which can quite literally save your life!  And if your first colonoscopy is normal and you have no family history of colon cancer, most women don't have to have it again for 10 years.  Once every ten years, you can do something that may end up saving your life, right?  We will be happy to help you get it scheduled when you are ready.  Be sure to check your insurance coverage so you understand how much it will cost.  It may be covered as a preventative service at no cost, but you should check   your particular policy.      Breast Self-Awareness Breast self-awareness means being familiar with how your breasts look and feel. It involves checking your breasts regularly and reporting any changes to your health care provider. Practicing breast self-awareness is  important. A change in your breasts can be a sign of a serious medical problem. Being familiar with how your breasts look and feel allows you to find any problems early, when treatment is more likely to be successful. All women should practice breast self-awareness, including women who have had breast implants. How to do a breast self-exam One way to learn what is normal for your breasts and whether your breasts are changing is to do a breast self-exam. To do a breast self-exam: Look for Changes  1. Remove all the clothing above your waist. 2. Stand in front of a mirror in a room with good lighting. 3. Put your hands on your hips. 4. Push your hands firmly downward. 5. Compare your breasts in the mirror. Look for differences between them (asymmetry), such as: ? Differences in shape. ? Differences in size. ? Puckers, dips, and bumps in one breast and not the other. 6. Look at each breast for changes in your skin, such as: ? Redness. ? Scaly areas. 7. Look for changes in your nipples, such as: ? Discharge. ? Bleeding. ? Dimpling. ? Redness. ? A change in position. Feel for Changes Carefully feel your breasts for lumps and changes. It is best to do this while lying on your back on the floor and again while sitting or standing in the shower or tub with soapy water on your skin. Feel each breast in the following way:  Place the arm on the side of the breast you are examining above your head.  Feel your breast with the other hand.  Start in the nipple area and make  inch (2 cm) overlapping circles to feel your breast. Use the pads of your three middle fingers to do this. Apply light pressure, then medium pressure, then firm pressure. The light pressure will allow you to feel the tissue closest to the skin. The medium pressure will allow you to feel the tissue that is a little deeper. The firm pressure will allow you to feel the tissue close to the ribs.  Continue the overlapping circles,  moving downward over the breast until you feel your ribs below your breast.  Move one finger-width toward the center of the body. Continue to use the  inch (2 cm) overlapping circles to feel your breast as you move slowly up toward your collarbone.  Continue the up and down exam using all three pressures until you reach your armpit.  Write Down What You Find  Write down what is normal for each breast and any changes that you find. Keep a written record with breast changes or normal findings for each breast. By writing this information down, you do not need to depend only on memory for size, tenderness, or location. Write down where you are in your menstrual cycle, if you are still menstruating. If you are having trouble noticing differences in your breasts, do not get discouraged. With time you will become more familiar with the variations in your breasts and more comfortable with the exam. How often should I examine my breasts? Examine your breasts every month. If you are breastfeeding, the best time to examine your breasts is after a feeding or after using a breast pump. If you menstruate, the best time to   examine your breasts is 5-7 days after your period is over. During your period, your breasts are lumpier, and it may be more difficult to notice changes. When should I see my health care provider? See your health care provider if you notice:  A change in shape or size of your breasts or nipples.  A change in the skin of your breast or nipples, such as a reddened or scaly area.  Unusual discharge from your nipples.  A lump or thick area that was not there before.  Pain in your breasts.  Anything that concerns you.  Breast Tenderness Breast tenderness is a common problem for women of all ages, but may also occur in men. Breast tenderness may range from mild discomfort to severe pain. In women, the pain usually comes and goes with the menstrual cycle, but it can also be  constant. Breast tenderness has many possible causes, including hormone changes, infections, and taking certain medicines. You may have tests, such as a mammogram or an ultrasound, to check for any unusual findings. Having breast tenderness usually does not mean that you have breast cancer. Follow these instructions at home: Managing pain and discomfort  If directed, put ice to the painful area. To do this: ? Put ice in a plastic bag. ? Place a towel between your skin and the bag. ? Leave the ice on for 20 minutes, 2-3 times a day.  Wear a supportive bra, especially during exercise. You may also want to wear a supportive bra while sleeping if your breasts are very tender.   Medicines  Take over-the-counter and prescription medicines only as told by your health care provider. If the cause of your pain is infection, you may be prescribed an antibiotic medicine.  If you were prescribed an antibiotic, take it as told by your health care provider. Do not stop taking the antibiotic even if you start to feel better. Eating and drinking  Your health care provider may recommend that you lessen the amount of fat in your diet. You can do this by: ? Limiting fried foods. ? Cooking foods using methods such as baking, boiling, grilling, and broiling.  Decrease the amount of caffeine in your diet. Instead, drink more water and choose caffeine-free drinks. General instructions  Keep a log of the days and times when your breasts are most tender.  Ask your health care provider how to do breast exams at home. This will help you notice if you have an unusual growth or lump.  Keep all follow-up visits as told by your health care provider. This is important.   Contact a health care provider if:  Any part of your breast is hard, red, and hot to the touch. This may be a sign of infection.  You are a woman and: ? Not breastfeeding and you have fluid, especially blood or pus, coming out of your  nipples. ? Have a new or painful lump in your breast that remains after your menstrual period ends.  You have a fever.  Your pain does not improve or it gets worse.  Your pain is interfering with your daily activities. Summary  Breast tenderness may range from mild discomfort to severe pain.  Breast tenderness has many possible causes, including hormone changes, infections, and taking certain medicines.  It can be treated with ice, wearing a supportive bra, and medicines.  Make changes to your diet if told to by your health care provider. This information is not intended to replace advice given  to you by your health care provider. Make sure you discuss any questions you have with your health care provider. Document Revised: 03/16/2019 Document Reviewed: 03/16/2019 Elsevier Patient Education  [place].   Kegel Exercises  Kegel exercises can help strengthen your pelvic floor muscles. The pelvic floor is a group of muscles that support your rectum, small intestine, and bladder. In females, pelvic floor muscles also help support the womb (uterus). These muscles help you control the flow of urine and stool. Kegel exercises are painless and simple, and they do not require any equipment. Your provider may suggest Kegel exercises to:  Improve bladder and bowel control.  Improve sexual response.  Improve weak pelvic floor muscles after surgery to remove the uterus (hysterectomy) or pregnancy (females).  Improve weak pelvic floor muscles after prostate gland removal or surgery (males). Kegel exercises involve squeezing your pelvic floor muscles, which are the same muscles you squeeze when you try to stop the flow of urine or keep from passing gas. The exercises can be done while sitting, standing, or lying down, but it is best to vary your position. Exercises How to do Kegel exercises: 1. Squeeze your pelvic floor muscles tight. You should feel a tight lift in your rectal area.  If you are a female, you should also feel a tightness in your vaginal area. Keep your stomach, buttocks, and legs relaxed. 2. Hold the muscles tight for up to 10 seconds. 3. Breathe normally. 4. Relax your muscles. 5. Repeat as told by your health care provider. Repeat this exercise daily as told by your health care provider. Continue to do this exercise for at least 4-6 weeks, or for as long as told by your health care provider. You may be referred to a physical therapist who can help you learn more about how to do Kegel exercises. Depending on your condition, your health care provider may recommend:  Varying how long you squeeze your muscles.  Doing several sets of exercises every day.  Doing exercises for several weeks.  Making Kegel exercises a part of your regular exercise routine. This information is not intended to replace advice given to you by your health care provider. Make sure you discuss any questions you have with your health care provider. Document Revised: 02/26/2020 Document Reviewed: 06/11/2018 Elsevier Patient Education  2021 [place].  Fecal Incontinence Fecal incontinence, also called accidental bowel leakage, is not being able to control your bowels. This condition happens because the nerves or muscles around the anus do not work the way they should. This affects their ability to hold stool (feces). What are the causes? This condition may be caused by:  Damage to the muscles at the end of the rectum (sphincter).  Damage to the nerves that control bowel movements.  Diarrhea.  Chronic constipation.  Pelvic floor dysfunction. This means the muscles in the pelvis do not work well.  Loss of bowel storage capacity. This occurs when the rectum can no longer stretch in size in order to store feces.  Inflammatory bowel disease (IBD), such as Crohn's disease.  Irritable bowel syndrome (IBS). What increases the risk? You are more likely to develop this  condition if you:  Were born with bowels or a pelvis that did not form correctly.  Have had rectal surgery.  Have had radiation treatment for certain cancers.  Have been pregnant, had a vaginal delivery, or had surgery that damaged the pelvic floor muscles.  Had a complicated childbirth, spinal cord injury, or other  trauma that caused nerve damage.  Have a condition that can affect nerve function, such as diabetes, Parkinson's disease, or multiple sclerosis.  Have a condition where the rectum drops down into the anus or vagina (prolapse).  Are 25 years of age or older. What are the signs or symptoms? The main symptom of this condition is not being able to control your bowels. You also might not be able to get to the bathroom before a bowel movement. How is this diagnosed? This condition is diagnosed with a medical history and physical exam. You may also have other tests, including:  Blood tests.  Urine tests.  A rectal exam.  Ultrasound.  MRI.  Colonoscopy. This is an exam that looks at your large intestine (colon).  Anal manometry. This is a test that measures the strength of the anal sphincter.  Anal electromyogram (EMG). This is a test that uses small electrodes to check for nerve damage. How is this treated? Treatment for this condition depends on the cause and severity. Treatment may also focus on addressing any underlying causes of this condition. Treatment may include:  Medicines. This may include medicines to: ? Prevent diarrhea. ? Help with constipation (bulk-forming laxatives). ? Treat any underlying conditions.  Biofeedback therapy. This can help to retrain muscles that are affected.  Fiber supplements. These can help manage your bowel movements.  Nerve stimulation.  Injectable gel to promote tissue growth and better muscle control.  Surgery. You may need: ? Sphincter repair surgery. ? Diversion surgery. This procedure lets feces pass out of your body  through a hole in your abdomen. Follow these instructions at home: Eating and drinking  Follow instructions from your health care provider about any eating or drinking restrictions. ? Work with a dietitian to come up with a healthy diet that will help you avoid the foods that can make your condition worse. ? Keep a diet diary to find out which foods or drinks could be making your condition worse.  Drink enough fluid to keep your urine pale yellow.   Lifestyle  Do not use any products that contain nicotine or tobacco, such as cigarettes and e-cigarettes. If you need help quitting, ask your health care provider. This may help your condition.  If you are overweight, talk with your health care provider about how to safely lose weight. This may help your condition.  Increase your physical activity as told by your health care provider. This may help your condition. Always talk with your health care provider before starting a new exercise program.  Carry a change of clothes and supplies to clean up quickly if you have an episode of fetal incontinence.  Consider joining a fecal incontinence support group. You can find a support group online or in your local community. General instructions  Take over-the-counter and prescription medicines only as told by your health care provider. This includes any supplements.  Apply a moisture barrier, such as petroleum jelly, to your rectum. This protects the skin from irritation caused by ongoing leaking or diarrhea.  Tell your health care provider if you are upset or depressed about your condition.  Keep all follow-up visits as told by your health care provider. This is important.   Where to find more information  International Foundation for Functional Gastrointestinal Disorders: iffgd.[place] [facility]: patients.gi.org Contact a health care provider if:  You have a fever.  You have redness, swelling, or pain around your  rectum.  Your pain is getting worse or  you lose feeling in your rectal area.  You have blood in your stool.  You feel sad or hopeless.  You avoid social or work situations. Get help right away if:  You stop having bowel movements.  You cannot eat or drink without vomiting.  You have rectal bleeding that does not stop.  You have severe pain that is getting worse.  You have symptoms of dehydration, including: ? Sleepiness or fatigue. ? Producing little or no urine, tears, or sweat. ? Dizziness. ? Dry mouth. ? Unusual irritability. ? Headache. ? Inability to think clearly. Summary  Fecal incontinence, also called accidental bowel leakage, is not being able to control your bowels. This condition happens because the nerves or muscles around the anus do not work the way they should.  Treatment varies depending on the cause and severity of your condition. Treatment may also focus on addressing any underlying causes of this condition.  Follow instructions from your health care provider about any eating or drinking restrictions, lifestyle changes, and skin care.  Take over-the-counter and prescription medicines only as told by your health care provider. This includes any supplements.  Tell your health care provider if your symptoms worsen or if you are upset or depressed about your condition. This information is not intended to replace advice given to you by your health care provider. Make sure you discuss any questions you have with your health care provider. Document Revised: 03/06/2018 Document Reviewed: 03/06/2018 Elsevier Patient Education  2021 [employer].

## 2020-12-23 LAB — CYTOLOGY - PAP
Comment: NEGATIVE
Diagnosis: NEGATIVE
High risk HPV: NEGATIVE

## 2020-12-30 ENCOUNTER — Other Ambulatory Visit: Payer: Self-pay | Admitting: Obstetrics and Gynecology

## 2021-01-09 DIAGNOSIS — Z85048 Personal history of other malignant neoplasm of rectum, rectosigmoid junction, and anus: Secondary | ICD-10-CM | POA: Diagnosis not present

## 2021-01-12 ENCOUNTER — Other Ambulatory Visit: Payer: Self-pay | Admitting: Obstetrics and Gynecology

## 2021-01-12 DIAGNOSIS — Z1231 Encounter for screening mammogram for malignant neoplasm of breast: Secondary | ICD-10-CM

## 2021-02-06 DIAGNOSIS — R0789 Other chest pain: Secondary | ICD-10-CM | POA: Diagnosis not present

## 2021-02-06 DIAGNOSIS — I10 Essential (primary) hypertension: Secondary | ICD-10-CM | POA: Diagnosis not present

## 2021-02-06 DIAGNOSIS — E7800 Pure hypercholesterolemia, unspecified: Secondary | ICD-10-CM | POA: Diagnosis not present

## 2021-02-06 DIAGNOSIS — M810 Age-related osteoporosis without current pathological fracture: Secondary | ICD-10-CM | POA: Diagnosis not present

## 2021-02-06 DIAGNOSIS — E119 Type 2 diabetes mellitus without complications: Secondary | ICD-10-CM | POA: Diagnosis not present

## 2021-03-01 DIAGNOSIS — H900 Conductive hearing loss, bilateral: Secondary | ICD-10-CM | POA: Diagnosis not present

## 2021-03-08 ENCOUNTER — Ambulatory Visit
Admission: RE | Admit: 2021-03-08 | Discharge: 2021-03-08 | Disposition: A | Discharge status: Home / Self Care | Payer: BC Managed Care – PPO | Source: Ambulatory Visit | Attending: Obstetrics and Gynecology | Admitting: Obstetrics and Gynecology

## 2021-03-08 ENCOUNTER — Other Ambulatory Visit: Payer: Self-pay | Admitting: Obstetrics and Gynecology

## 2021-03-08 ENCOUNTER — Other Ambulatory Visit: Payer: Self-pay

## 2021-03-08 DIAGNOSIS — Z1231 Encounter for screening mammogram for malignant neoplasm of breast: Secondary | ICD-10-CM

## 2021-03-08 DIAGNOSIS — N644 Mastodynia: Secondary | ICD-10-CM

## 2021-03-09 ENCOUNTER — Encounter: Payer: Self-pay | Admitting: Cardiology

## 2021-03-09 ENCOUNTER — Ambulatory Visit (INDEPENDENT_AMBULATORY_CARE_PROVIDER_SITE_OTHER): Payer: BC Managed Care – PPO | Admitting: Cardiology

## 2021-03-09 VITALS — BP 120/67 | HR 73 | Ht 61.0 in | Wt 168.6 lb

## 2021-03-09 DIAGNOSIS — I10 Essential (primary) hypertension: Secondary | ICD-10-CM

## 2021-03-09 DIAGNOSIS — E119 Type 2 diabetes mellitus without complications: Secondary | ICD-10-CM

## 2021-03-09 DIAGNOSIS — E7800 Pure hypercholesterolemia, unspecified: Secondary | ICD-10-CM | POA: Diagnosis not present

## 2021-03-09 DIAGNOSIS — R0602 Shortness of breath: Secondary | ICD-10-CM | POA: Diagnosis not present

## 2021-03-09 DIAGNOSIS — I700 Atherosclerosis of aorta: Secondary | ICD-10-CM | POA: Diagnosis not present

## 2021-03-09 DIAGNOSIS — R0609 Other forms of dyspnea: Secondary | ICD-10-CM | POA: Insufficient documentation

## 2021-03-09 NOTE — Progress Notes (Signed)
0

## 2021-03-09 NOTE — Patient Instructions (Addendum)
Medication Instructions:  No changes  *If you need a refill on your cardiac medications before your next appointment, please call your pharmacy*   Lab Work:  Not needed   Testing/Procedures: Both test will be schedule at [facility] has requested that you have an echocardiogram. Echocardiography is a painless test that uses sound waves to create images of your heart. It provides your doctor with information about the size and shape of your heart and how well your heart's chambers and valves are working. This procedure takes approximately one hour. There are no restrictions for this procedure.  And  CT coronary calcium score. This test is done at [address]. This is $99 out of pocket.   Coronary CalciumScan A coronary calcium scan is an imaging test used to look for deposits of calcium and other fatty materials (plaques) in the inner lining of the blood vessels of the heart (coronary arteries). These deposits of calcium and plaques can partly clog and narrow the coronary arteries without producing any symptoms or warning signs. This puts a person at risk for a heart attack. This test can detect these deposits before symptoms develop. Tell a health care provider about:  Any allergies you have.  All medicines you are taking, including vitamins, herbs, eye drops, creams, and over-the-counter medicines.  Any problems you or family members have had with anesthetic medicines.  Any blood disorders you have.  Any surgeries you have had.  Any medical conditions you have.  Whether you are pregnant or may be pregnant. What are the risks? Generally, this is a safe procedure. However, problems may occur, including:  Harm to a pregnant woman and her unborn baby. This test involves the use of radiation. Radiation exposure can be dangerous to a pregnant woman and her unborn baby. If you are pregnant, you generally should not have this  procedure done.  Slight increase in the risk of cancer. This is because of the radiation involved in the test. What happens before the procedure? No preparation is needed for this procedure. What happens during the procedure?  You will undress and remove any jewelry around your neck or chest.  You will put on a hospital gown.  Sticky electrodes will be placed on your chest. The electrodes will be connected to an electrocardiogram (ECG) machine to record a tracing of the electrical activity of your heart.  A CT scanner will take pictures of your heart. During this time, you will be asked to lie still and hold your breath for 2-3 seconds while a picture of your heart is being taken. The procedure may vary among health care providers and hospitals. What happens after the procedure?  You can get dressed.  You can return to your normal activities.  It is up to you to get the results of your test. Ask your health care provider, or the department that is doing the test, when your results will be ready. Summary  A coronary calcium scan is an imaging test used to look for deposits of calcium and other fatty materials (plaques) in the inner lining of the blood vessels of the heart (coronary arteries).  Generally, this is a safe procedure. Tell your health care provider if you are pregnant or may be pregnant.  No preparation is needed for this procedure.  A CT scanner will take pictures of your heart.  You can return to your normal activities after the scan is done. This information  is not intended to replace advice given to you by your health care provider. Make sure you discuss any questions you have with your health care provider. Document Released: 04/19/2008 Document Revised: 09/10/2016 Document Reviewed: 09/10/2016 Elsevier Interactive Patient Education  2017 [place]: At [facility], you and your health needs are our priority.  As part of our continuing mission  to provide you with exceptional heart care, we have created designated Provider Care Teams.  These Care Teams include your primary Cardiologist (physician) and Advanced Practice Providers (APPs -  Physician Assistants and Nurse Practitioners) who all work together to provide you with the care you need, when you need it.     Your next appointment:   1 to 2 month(s)  The format for your next appointment:   virtual or in person  Provider:   Glenetta Hew, MD

## 2021-03-09 NOTE — Progress Notes (Signed)
Primary Care Provider: Shirline Frees, MD Cardiologist: None Electrophysiologist: None  Clinic Note: Chief Complaint  Patient presents with  . New Patient (Initial Visit)  . Shortness of Breath   ===================================  ASSESSMENT/PLAN   Problem List Items Addressed This Visit    Shortness of breath - Primary    On associated hunger.  As an anginal equivalent, we were consistent to be postprandial dyspnea and then hunger related.  Need to exclude CHF and chronic abnormality. Check 2D echo. Based on Cardiovascular Risk with Coronary Calcium Score      Relevant Orders   EKG 12-Lead   CT CARDIAC SCORING (SELF PAY ONLY)   ECHOCARDIOGRAM COMPLETE   Essential hypertension (Chronic)    BP controlled on combination of Beta blocker & HCTZ      Relevant Medications   aspirin 81 MG chewable tablet   Hardening of the aorta (main artery of the heart) (HCC) (Chronic)    Aortic Atherosclerosis noted on CT with HTN, HLD & DM-2.  Plan - check 2 D Echo for sign of Aortic Dilation / Aneurysm  Continue atherosclerosis Rx with ASA, beta blocker & statin  Baseline CV evaluation with Coronary Calcium Score      Relevant Medications   aspirin 81 MG chewable tablet   Other Relevant Orders   EKG 12-Lead   CT CARDIAC SCORING (SELF PAY ONLY)   ECHOCARDIOGRAM COMPLETE   Pure hypercholesterolemia (Chronic)    Hyperlipidemia associated with Hypertension & DM-2 along with Obesity = Metabolic Syndrome.  Will assess baseline CV risk with Coronary Calcium Score.       Relevant Medications   aspirin 81 MG chewable tablet   Type 2 diabetes mellitus without complication (HCC) (Chronic)    A1c recently 6.6 - not currently on meds; diet controlled.      Relevant Medications   aspirin 81 MG chewable tablet   Other Relevant Orders   CT CARDIAC SCORING (SELF PAY ONLY)     ===================================  HPI:    Angelica Wright is a 65 y.o. female with a PMH  notable for Mild DM-2, HTN & HLD who presents today for evaluation of CHEST PAIN & SHORTNESS OF BREATH with HUNGER.  Angelica Wright was seen by Dr. Kenton Kingfisher on [id number] for routine follow-up. She noted getting short of breath at times, usually when she is hungry.  Also, often associated with right sided (upper right) chest pain that can sometimes be related to exertion & hunger.   She was noting insomnia that she has been treating with trazodone.   Recent Hospitalizations: none  Reviewed  CV studies:    The following studies were reviewed today: (if available, images/films reviewed: From Epic Chart or Care Everywhere) . None:   Interval History:   Angelica Wright is here with similar symptoms.  She tells me that it is very strange.  When she is hungry she feels her heart rate go up, and she gets short of breath.  She may or may not have some chest discomfort associated with it but his right upper chest.  She also can have some epigastric type discomfort.  Interestingly, every once she eats, the shortness of breath and chest discomfort goes away.  At baseline, she is very sedentary and deconditioned.  Admittedly is not very active.  She will get short of breath if she goes up and down stairs which is about the most she does because of having two-story house.  Exercise is limited by bilateral  knee pain.  She tells me that she routinely checks her blood pressure at home and is doing well.  She also has a Kardia monitor which she is using to monitor for any arrhythmias--nothing notable for STEMI..  CV Review of Symptoms (Summary): positive for - chest pain, shortness of breath and both Chest Pain & SOB with hunger - NOT after eating. CP is Right sided.  negative for - dyspnea on exertion, edema, irregular heartbeat, orthopnea, palpitations, paroxysmal nocturnal dyspnea, rapid heart rate or Lightheadedness, dizziness or wooziness, syncope/near syncope or TIA/amaurosis fugax, claudication  The patient  does not have symptoms concerning for COVID-19 infection (fever, chills, cough, or new shortness of breath).   REVIEWED OF SYSTEMS   Review of Systems  Constitutional: Negative for malaise/fatigue and weight loss.  HENT: Negative for congestion and nosebleeds.   Respiratory: Positive for shortness of breath (With exertion, but with hunger (not postprandial)).   Cardiovascular: Positive for chest pain (with hunger). Negative for leg swelling.  Gastrointestinal: Negative for blood in stool and melena.  Genitourinary: Negative for hematuria.  Musculoskeletal: Positive for joint pain. Negative for falls and myalgias.  Neurological: Negative for dizziness, focal weakness, weakness and headaches.  Psychiatric/Behavioral: Negative for depression and memory loss. The patient has insomnia (uses trazodone). The patient is not nervous/anxious.    I have reviewed and (if needed) personally updated the patient's problem list, medications, allergies, past medical and surgical history, social and family history.   PAST MEDICAL HISTORY   Past Medical History:  Diagnosis Date  . Aortic atherosclerosis ([place])   . Essential hypertension   . Hyperlipidemia associated with type 2 diabetes mellitus ([place])   . Hyperparathyroidism ([place])    resolved after surgery  . Nonalcoholic steatohepatitis ([facility])   . Osteoarthritis    Hands, feet, knees  . Osteoporosis    of the spine  . Rectocele   . Sensorineural hearing loss (SNHL) of both ears   . Squamous cell cancer, anus (HCC)    (Dr. Ermelinda Das -oncology) -radiation and chemo therapy  . TMJ syndrome   . Type 2 Diabetes ([place]) 11/2013   controlled with diet/exercise    PAST SURGICAL HISTORY   Past Surgical History:  Procedure Laterality Date  . LYMPH NODE BIOPSY    . PARATHYROIDECTOMY  2000  . THERAPEUTIC ABORTION     x 2  . VULVA /PERINEUM BIOPSY  2012    Immunization History  Administered Date(s) Administered  . Influenza Split 08/18/2009,  08/29/2010, 08/29/2011, 08/07/2013, 08/12/2014  . Influenza, Quadrivalent, Recombinant, Inj, Pf 07/31/2019, 08/05/2020  . Influenza,inj,Quad PF,6+ Mos 08/16/2016, 08/21/2017, 07/29/2018  . Influenza-Unspecified 08/25/2015, 08/14/2016, 08/05/2020  . PFIZER Comirnaty(Gray Top)Covid-19 Tri-Sucrose Vaccine 01/22/2020, 02/12/2020, 10/09/2020  . PFIZER(Purple Top)SARS-COV-2 Vaccination 01/22/2020, 02/12/2020, 10/09/2020  . Td 01/04/1998  . Tdap 05/27/2008, 11/05/2010, 12/22/2020    MEDICATIONS/ALLERGIES   Current Meds  Medication Sig  . alendronate (FOSAMAX) 70 MG tablet Take 1 tablet (70 mg total) by mouth every 7 (seven) days. Take first thing in am with 6 oz. Water.  Be upright after taking.  Eat nothing for one hour.  Marland Kitchen aspirin 81 MG chewable tablet Chew 81 mg by mouth daily.  Marland Kitchen atenolol (TENORMIN) 100 MG tablet Take 100 mg by mouth daily.  Marland Kitchen atorvastatin (LIPITOR) 20 MG tablet Take 1 tablet by mouth daily.  . Blood Glucose Monitoring Suppl (ACCU-CHEK GUIDE) w/Device KIT daily.  . calcium carbonate (TUMS - DOSED IN MG ELEMENTAL CALCIUM) 500 MG chewable tablet Chew 1 tablet by  mouth daily as needed for indigestion or heartburn. PRN  . Cholecalciferol (VITAMIN D3) 125 MCG (5000 UT) CAPS Take 5,000 Units by mouth daily.  . clobetasol ointment (TEMOVATE) 0.05 % Apply a pea sized amount topically 1-2 x a week and  BID x 1-2 weeks prn flare  . Estradiol (YUVAFEM) 10 MCG TABS vaginal tablet Place one tablet vaginally 2 x a week  . hydrochlorothiazide (HYDRODIURIL) 12.5 MG tablet Take 12.5 mg by mouth daily.   Marland Kitchen ibuprofen (ADVIL,MOTRIN) 100 MG tablet Take 400-600 mg by mouth 2 (two) times daily as needed for pain.  . Lancets (ACCU-CHEK MULTICLIX) lancets See admin instructions.  . silver sulfADIAZINE (SILVADENE) 1 % cream Apply 1 application topically daily.  . traZODone (DESYREL) 50 MG tablet Take 50 mg by mouth as needed.  . valACYclovir (VALTREX) 500 MG tablet Take one tab po BID for 3 days with  flares.  . vitamin C (ASCORBIC ACID) 500 MG tablet Take 500 mg by mouth daily.    Allergies  Allergen Reactions  . Polymyxin B-Trimethoprim Itching  . Sulfa Antibiotics Nausea And Vomiting    SOCIAL HISTORY/FAMILY HISTORY   Reviewed in Epic:  Pertinent findings:  Social History   Tobacco Use  . Smoking status: Never Smoker  . Smokeless tobacco: Never Used  Vaping Use  . Vaping Use: Never used  Substance Use Topics  . Alcohol use: Yes    Alcohol/week: 7.0 standard drinks    Types: 7 Glasses of wine per week  . Drug use: No   Social History   Social History Narrative   Does not exercise routinely because of bone-on-bone osteoarthritis pains in her knees.-Tries to walk much as she can.   Roman [place], EKG, labs   Wt Readings from Last 3 Encounters:  03/09/21 168 lb 9.6 oz (76.5 kg)  12/22/20 163 lb (73.9 kg)  10/18/20 161 lb 1.6 oz (73.1 kg)    Physical Exam: BP 120/67   Pulse 73   Ht _0  (1.549 m)   Wt 168 lb 9.6 oz (76.5 kg)   LMP 11/05/2002 (Approximate)   SpO2 99%   BMI 31.86 kg/m  Physical Exam Vitals reviewed.  Constitutional:      General: She is not in acute distress.    Appearance: She is well-developed. She is obese. She is not ill-appearing or toxic-appearing.  HENT:     Head: Normocephalic and atraumatic.  Neck:     Vascular: No carotid bruit.  Cardiovascular:     Rate and Rhythm: Normal rate and regular rhythm.     Pulses: Normal pulses.     Heart sounds: Normal heart sounds. No murmur heard. No friction rub. No gallop.   Pulmonary:     Effort: Pulmonary effort is normal. No respiratory distress.     Breath sounds: Normal breath sounds. No stridor. No wheezing, rhonchi or rales.  Chest:     Chest wall: Tenderness present.  Abdominal:     General: Abdomen is flat. Bowel sounds are normal. There is no distension.     Palpations: Abdomen is soft. There is no mass (no HSM).     Tenderness: There is no abdominal  tenderness. There is no rebound.  Musculoskeletal:        General: No swelling. Normal range of motion.     Cervical back: Normal range of motion and neck supple.  Skin:    General: Skin is warm and dry.  Neurological:  General: No focal deficit present.     Mental Status: She is alert and oriented to person, place, and time.     Cranial Nerves: No cranial nerve deficit (grossly intact).  Psychiatric:        Mood and Affect: Mood normal.        Behavior: Behavior normal.        Thought Content: Thought content normal.        Judgment: Judgment normal.     Adult ECG Report  Rate: 73 ;  Rhythm: normal sinus rhythm, sinus arrhythmia and Normal axis, intervals and durations.;   Narrative Interpretation: Normal EKG  Recent Labs:  reviewed  No results found for: CHOL, HDL, LDLCALC, LDLDIRECT, TRIG, CHOLHDL Lab Results  Component Value Date   CREATININE 0.79 10/18/2020   BUN 15 10/18/2020   NA 141 10/18/2020   K 4.5 10/18/2020   CL 109 10/18/2020   CO2 25 10/18/2020   CBC Latest Ref Rng & Units 10/18/2020 04/14/2020 10/19/2019  WBC 4.0 - 10.5 K/uL 5.7 4.9 5.1  Hemoglobin 12.0 - 15.0 g/dL 13.1 13.5 13.3  Hematocrit 36.0 - 46.0 % 39.0 39.8 39.1  Platelets 150 - 400 K/uL 260 262 251    08/05/20: Hgb A1c 6.6 Lab Results  Component Value Date   TSH 1.67 06/29/2019    ==================================================  COVID-19 Education: The signs and symptoms of COVID-19 were discussed with the patient and how to seek care for testing (follow up with PCP or arrange E-visit).   The importance of social distancing and COVID-19 vaccination was discussed today. The patient is practicing social distancing & Masking.   I spent a total of 28mnutes with the patient spent in direct patient consultation.  Additional time spent with chart review  / charting (studies, outside notes, etc): 16 min Total Time: 49 min  Current medicines are reviewed at length with the patient today.   (+/- concerns) n/a  This visit occurred during the SARS-CoV-2 public health emergency.  Safety protocols were in place, including screening questions prior to the visit, additional usage of staff PPE, and extensive cleaning of exam room while observing appropriate contact time as indicated for disinfecting solutions.  Notice: This dictation was prepared with Dragon dictation along with smaller phrase technology. Any transcriptional errors that result from this process are unintentional and may not be corrected upon review.  Patient Instructions / Medication Changes & Studies & Tests Ordered   Patient Instructions  Medication Instructions:  No changes  *If you need a refill on your cardiac medications before your next appointment, please call your pharmacy*   Lab Work:  Not needed   Testing/Procedures: [facility] test [facility] you have an [facility]. Echocardiography is a painless test that uses sound waves to create images of your heart. It provides your doctor with information about the size and shape of your heart and how well your heart's chambers and valves are working. This procedure takes approximately one hour. There are no restrictions for this procedure.  And  CT coronary calcium score. This test is done at [address]. This is $99 out of pocket.   Coronary CalciumScan A coronary calcium scan is an imaging test used to look for deposits of calcium and other fatty materials (plaques) in the inner lining of the blood vessels of the heart (coronary arteries). These deposits of calcium and plaques can partly clog and narrow  the coronary arteries without producing any symptoms or warning signs. This puts a person at risk for a heart attack. This test can detect these deposits before symptoms develop. Tell a health care provider about:  Any allergies you have.  All medicines you are  taking, including vitamins, herbs, eye drops, creams, and over-the-counter medicines.  Any problems you or family members have had with anesthetic medicines.  Any blood disorders you have.  Any surgeries you have had.  Any medical conditions you have.  Whether you are pregnant or may be pregnant. What are the risks? Generally, this is a safe procedure. However, problems may occur, including:  Harm to a pregnant woman and her unborn baby. This test involves the use of radiation. Radiation exposure can be dangerous to a pregnant woman and her unborn baby. If you are pregnant, you generally should not have this procedure done.  Slight increase in the risk of cancer. This is because of the radiation involved in the test. What happens before the procedure? No preparation is needed for this procedure. What happens during the procedure?  You will undress and remove any jewelry around your neck or chest.  You will put on a hospital gown.  Sticky electrodes will be placed on your chest. The electrodes will be connected to an electrocardiogram (ECG) machine to record a tracing of the electrical activity of your heart.  A CT scanner will take pictures of your heart. During this time, you will be asked to lie still and hold your breath for 2-3 seconds while a picture of your heart is being taken. The procedure may vary among health care providers and hospitals. What happens after the procedure?  You can get dressed.  You can return to your normal activities.  It is up to you to get the results of your test. Ask your health care provider, or the department that is doing the test, when your results will be ready. Summary  A coronary calcium scan is an imaging test used to look for deposits of calcium and other fatty materials (plaques) in the inner lining of the blood vessels of the heart (coronary arteries).  Generally, this is a safe procedure. Tell your health care provider if you are  pregnant or may be pregnant.  No preparation is needed for this procedure.  A CT scanner will take pictures of your heart.  You can return to your normal activities after the scan is done. This information is not intended to replace advice given to you by your health care provider. Make sure you discuss any questions you have with your health care provider. Document Released: 04/19/2008 Document Revised: 09/10/2016 Document Reviewed: 09/10/2016 Elsevier Interactive Patient Education  2017 [place]: At [facility], you and your health needs are our priority.  As part of our continuing mission to provide you with exceptional heart care, we have created designated Provider Care Teams.  These Care Teams include your primary Cardiologist (physician) and Advanced Practice Providers (APPs -  Physician Assistants and Nurse Practitioners) who all work together to provide you with the care you need, when you need it.     Your next appointment:   1 to 2 month(s)  The format for your next appointment:   virtual or in person  Provider:   Glenetta Hew, MD    Studies Ordered:   Orders Placed This Encounter  Procedures  . CT CARDIAC SCORING (SELF PAY ONLY)  . EKG 12-Lead  . ECHOCARDIOGRAM  COMPLETE      Glenetta Hew, M.D., M.S. Interventional Cardiologist   Pager # [phone] Phone # [phone] [address] [place], [place] [address]   Thank you for choosing [facility] at [facility]!!

## 2021-03-12 ENCOUNTER — Encounter: Payer: Self-pay | Admitting: Cardiology

## 2021-03-12 NOTE — Progress Notes (Incomplete)
Primary Care Provider: Shirline Frees, MD Cardiologist: None Electrophysiologist: None  Clinic Note: Chief Complaint  Patient presents with  . New Patient (Initial Visit)  . Shortness of Breath   ===================================  ASSESSMENT/PLAN   Problem List Items Addressed This Visit    Shortness of breath   Relevant Orders   EKG 12-Lead   CT CARDIAC SCORING (SELF PAY ONLY)   ECHOCARDIOGRAM COMPLETE   Essential hypertension - Primary (Chronic)   Relevant Medications   aspirin 81 MG chewable tablet   Hardening of the aorta (main artery of the heart) (HCC) (Chronic)   Relevant Medications   aspirin 81 MG chewable tablet   Other Relevant Orders   EKG 12-Lead   CT CARDIAC SCORING (SELF PAY ONLY)   ECHOCARDIOGRAM COMPLETE   Pure hypercholesterolemia (Chronic)   Relevant Medications   aspirin 81 MG chewable tablet   Type 2 diabetes mellitus without complication (HCC) (Chronic)   Relevant Medications   aspirin 81 MG chewable tablet   Other Relevant Orders   CT CARDIAC SCORING (SELF PAY ONLY)     ===================================  HPI:    Angelica Wright is a 65 y.o. female with a PMH notable for Mild DM-2, HTN & HLD who presents today for evaluation of CHEST PAIN & SHORTNESS OF BREATH with HUNGER.  Angelica Wright was seen by Dr. Kenton Kingfisher on [id number] for routine follow-up. She noted getting short of breath at times, usually when she is hungry.  Also, often associated with right sided (upper right) chest pain that can sometimes be related to exertion & hunger.   She was noting insomnia that she has been treating with trazodone.   Recent Hospitalizations: none  Reviewed  CV studies:    The following studies were reviewed today: (if available, images/films reviewed: From Epic Chart or Care Everywhere) . None:   Interval History:   Angelica Wright is here with similar symptoms.  She tells me that it is very strange.  When she is hungry she feels her heart  rate go up, and she gets short of breath.  She may or may not have some chest discomfort associated with it but his right upper chest.  She also can have some epigastric type discomfort.  Interestingly, every once she eats, the shortness of breath and chest discomfort goes away.  At baseline, she is very sedentary and deconditioned.  Admittedly is not very active.  She will get short of breath if she goes up and down stairs which is about the most she does because of having two-story house.  Exercise is limited by bilateral knee pain.  She tells me that she routinely checks her blood pressure at home and is doing well.  She also has a Kardia monitor which she is using to monitor for any arrhythmias--nothing notable for STEMI..  CV Review of Symptoms (Summary): positive for - chest pain, shortness of breath and both Chest Pain & SOB with hunger - NOT after eating. CP is Right sided.  negative for - dyspnea on exertion, edema, irregular heartbeat, orthopnea, palpitations, paroxysmal nocturnal dyspnea, rapid heart rate or Lightheadedness, dizziness or wooziness, syncope/near syncope or TIA/amaurosis fugax, claudication  The patient does not have symptoms concerning for COVID-19 infection (fever, chills, cough, or new shortness of breath).   REVIEWED OF SYSTEMS   Review of Systems  Constitutional: Negative for malaise/fatigue and weight loss.  HENT: Negative for congestion and nosebleeds.   Respiratory: Positive for shortness of breath (With exertion, but  with hunger (not postprandial)).   Cardiovascular: Positive for chest pain (with hunger). Negative for leg swelling.  Gastrointestinal: Negative for blood in stool and melena.  Genitourinary: Negative for hematuria.  Musculoskeletal: Positive for joint pain. Negative for falls and myalgias.  Neurological: Negative for dizziness, focal weakness, weakness and headaches.  Psychiatric/Behavioral: Negative for depression and memory loss. The patient  has insomnia (uses trazodone). The patient is not nervous/anxious.    I have reviewed and (if needed) personally updated the patient's problem list, medications, allergies, past medical and surgical history, social and family history.   PAST MEDICAL HISTORY   Past Medical History:  Diagnosis Date  . Aortic atherosclerosis ([place])   . Essential hypertension   . Hyperlipidemia associated with type 2 diabetes mellitus ([place])   . Hyperparathyroidism ([place])    resolved after surgery  . Nonalcoholic steatohepatitis ([facility])   . Osteoarthritis    Hands, feet, knees  . Osteoporosis    of the spine  . Rectocele   . Sensorineural hearing loss (SNHL) of both ears   . Squamous cell cancer, anus (HCC)    (Dr. Ermelinda Das -oncology) -radiation and chemo therapy  . TMJ syndrome   . Type 2 Diabetes ([place]) 11/2013   controlled with diet/exercise    PAST SURGICAL HISTORY   Past Surgical History:  Procedure Laterality Date  . LYMPH NODE BIOPSY    . PARATHYROIDECTOMY  2000  . THERAPEUTIC ABORTION     x 2  . VULVA /PERINEUM BIOPSY  2012    Immunization History  Administered Date(s) Administered  . Influenza Split 08/18/2009, 08/29/2010, 08/29/2011, 08/07/2013, 08/12/2014  . Influenza, Quadrivalent, Recombinant, Inj, Pf 07/31/2019, 08/05/2020  . Influenza,inj,Quad PF,6+ Mos 08/16/2016, 08/21/2017, 07/29/2018  . Influenza-Unspecified 08/25/2015, 08/14/2016, 08/05/2020  . PFIZER Comirnaty(Gray Top)Covid-19 Tri-Sucrose Vaccine 01/22/2020, 02/12/2020, 10/09/2020  . PFIZER(Purple Top)SARS-COV-2 Vaccination 01/22/2020, 02/12/2020, 10/09/2020  . Td 01/04/1998  . Tdap 05/27/2008, 11/05/2010, 12/22/2020    MEDICATIONS/ALLERGIES   Current Meds  Medication Sig  . alendronate (FOSAMAX) 70 MG tablet Take 1 tablet (70 mg total) by mouth every 7 (seven) days. Take first thing in am with 6 oz. Water.  Be upright after taking.  Eat nothing for one hour.  Marland Kitchen aspirin 81 MG chewable tablet Chew 81 mg by mouth daily.   Marland Kitchen atenolol (TENORMIN) 100 MG tablet Take 100 mg by mouth daily.  Marland Kitchen atorvastatin (LIPITOR) 20 MG tablet Take 1 tablet by mouth daily.  . Blood Glucose Monitoring Suppl (ACCU-CHEK GUIDE) w/Device KIT daily.  . calcium carbonate (TUMS - DOSED IN MG ELEMENTAL CALCIUM) 500 MG chewable tablet Chew 1 tablet by mouth daily as needed for indigestion or heartburn. PRN  . Cholecalciferol (VITAMIN D3) 125 MCG (5000 UT) CAPS Take 5,000 Units by mouth daily.  . clobetasol ointment (TEMOVATE) 0.05 % Apply a pea sized amount topically 1-2 x a week and  BID x 1-2 weeks prn flare  . Estradiol (YUVAFEM) 10 MCG TABS vaginal tablet Place one tablet vaginally 2 x a week  . hydrochlorothiazide (HYDRODIURIL) 12.5 MG tablet Take 12.5 mg by mouth daily.   Marland Kitchen ibuprofen (ADVIL,MOTRIN) 100 MG tablet Take 400-600 mg by mouth 2 (two) times daily as needed for pain.  . Lancets (ACCU-CHEK MULTICLIX) lancets See admin instructions.  . silver sulfADIAZINE (SILVADENE) 1 % cream Apply 1 application topically daily.  . traZODone (DESYREL) 50 MG tablet Take 50 mg by mouth as needed.  . valACYclovir (VALTREX) 500 MG tablet Take one tab po BID for 3 days  with flares.  . vitamin C (ASCORBIC ACID) 500 MG tablet Take 500 mg by mouth daily.    Allergies  Allergen Reactions  . Polymyxin B-Trimethoprim Itching  . Sulfa Antibiotics Nausea And Vomiting    SOCIAL HISTORY/FAMILY HISTORY   Reviewed in Epic:  Pertinent findings:  Social History   Tobacco Use  . Smoking status: Never Smoker  . Smokeless tobacco: Never Used  Vaping Use  . Vaping Use: Never used  Substance Use Topics  . Alcohol use: Yes    Alcohol/week: 7.0 standard drinks    Types: 7 Glasses of wine per week  . Drug use: No   Social History   Social History Narrative   Does not exercise routinely because of bone-on-bone osteoarthritis pains in her knees.-Tries to walk much as she can.   [employer]    OBJCTIVE -PE, EKG, labs   Wt Readings from Last 3  Encounters:  03/09/21 168 lb 9.6 oz (76.5 kg)  12/22/20 163 lb (73.9 kg)  10/18/20 161 lb 1.6 oz (73.1 kg)    Physical Exam: BP 120/67   Pulse 73   Ht '5\' 1"'  (1.549 m)   Wt 168 lb 9.6 oz (76.5 kg)   LMP 11/05/2002 (Approximate)   SpO2 99%   BMI 31.86 kg/m  Physical Exam Vitals reviewed.  Constitutional:      General: She is not in acute distress.    Appearance: She is well-developed. She is obese. She is not ill-appearing or toxic-appearing.  HENT:     Head: Normocephalic and atraumatic.  Neck:     Vascular: No carotid bruit.  Cardiovascular:     Rate and Rhythm: Normal rate and regular rhythm.     Pulses: Normal pulses.     Heart sounds: Normal heart sounds. No murmur heard. No friction rub. No gallop.   Pulmonary:     Effort: Pulmonary effort is normal. No respiratory distress.     Breath sounds: Normal breath sounds. No stridor. No wheezing, rhonchi or rales.  Chest:     Chest wall: Tenderness present.  Abdominal:     General: Abdomen is flat. Bowel sounds are normal. There is no distension.     Palpations: Abdomen is soft. There is no mass (no HSM).     Tenderness: There is no abdominal tenderness. There is no rebound.  Musculoskeletal:        General: No swelling. Normal range of motion.     Cervical back: Normal range of motion and neck supple.  Skin:    General: Skin is warm and dry.  Neurological:     General: No focal deficit present.     Mental Status: She is alert and oriented to person, place, and time.     Cranial Nerves: No cranial nerve deficit (grossly intact).  Psychiatric:        Mood and Affect: Mood normal.        Behavior: Behavior normal.        Thought Content: Thought content normal.        Judgment: Judgment normal.     Adult ECG Report  Rate: 73 ;  Rhythm: normal sinus rhythm, sinus arrhythmia and Normal axis, intervals and durations.;   Narrative Interpretation: Normal EKG  Recent Labs:  reviewed  No results found for: CHOL, HDL,  LDLCALC, LDLDIRECT, TRIG, CHOLHDL Lab Results  Component Value Date   CREATININE 0.79 10/18/2020   BUN 15 10/18/2020   NA 141 10/18/2020   K 4.5 10/18/2020  CL 109 10/18/2020   CO2 25 10/18/2020   CBC Latest Ref Rng & Units 10/18/2020 04/14/2020 10/19/2019  WBC 4.0 - 10.5 K/uL 5.7 4.9 5.1  Hemoglobin 12.0 - 15.0 g/dL 13.1 13.5 13.3  Hematocrit 36.0 - 46.0 % 39.0 39.8 39.1  Platelets 150 - 400 K/uL 260 262 251    08/05/20: Hgb A1c 6.6 Lab Results  Component Value Date   TSH 1.67 06/29/2019    ==================================================  COVID-19 Education: The signs and symptoms of COVID-19 were discussed with the patient and how to seek care for testing (follow up with PCP or arrange E-visit).   The importance of social distancing and COVID-19 vaccination was discussed today. The patient is practicing social distancing & Masking.   I spent a total of 38mnutes with the patient spent in direct patient consultation.  Additional time spent with chart review  / charting (studies, outside notes, etc): 16 min Total Time: 49 min  Current medicines are reviewed at length with the patient today.  (+/- concerns) n/a  This visit occurred during the SARS-CoV-2 public health emergency.  Safety protocols were in place, including screening questions prior to the visit, additional usage of staff PPE, and extensive cleaning of exam room while observing appropriate contact time as indicated for disinfecting solutions.  Notice: This dictation was prepared with Dragon dictation along with smaller phrase technology. Any transcriptional errors that result from this process are unintentional and may not be corrected upon review.  Patient Instructions / Medication Changes & Studies & Tests Ordered   Patient Instructions  Medication Instructions:  No changes  *If you need a refill on your cardiac medications before your next appointment, please call your pharmacy*   Lab Work:  Not  needed   Testing/Procedures: [facility] test [facility] you have an echocardiogram. Echocardiography is a painless test that uses sound waves to create images of your heart. It provides your doctor with information about the size and shape of your heart and how well your heart's chambers and valves are working. This procedure takes approximately one hour. There are no restrictions for this procedure.  And  CT coronary calcium score. This test is done at [address]. This is $99 out of pocket.   Coronary CalciumScan A coronary calcium scan is an imaging test used to look for deposits of calcium and other fatty materials (plaques) in the inner lining of the blood vessels of the heart (coronary arteries). These deposits of calcium and plaques can partly clog and narrow the coronary arteries without producing any symptoms or warning signs. This puts a person at risk for a heart attack. This test can detect these deposits before symptoms develop. Tell a health care provider about:  Any allergies you have.  All medicines you are taking, including vitamins, herbs, eye drops, creams, and over-the-counter medicines.  Any problems you or family members have had with anesthetic medicines.  Any blood disorders you have.  Any surgeries you have had.  Any medical conditions you have.  Whether you are pregnant or may be pregnant. What are the risks? Generally, this is a safe procedure. However, problems may occur, including:  Harm to a pregnant woman and her unborn baby. This test involves the use of radiation. Radiation exposure can be dangerous to a pregnant woman and her unborn baby. If you are pregnant, you generally should not have this procedure done.  Slight  increase in the risk of cancer. This is because of the radiation involved in the test. What happens before the procedure? No preparation is  needed for this procedure. What happens during the procedure?  You will undress and remove any jewelry around your neck or chest.  You will put on a hospital gown.  Sticky electrodes will be placed on your chest. The electrodes will be connected to an electrocardiogram (ECG) machine to record a tracing of the electrical activity of your heart.  A CT scanner will take pictures of your heart. During this time, you will be asked to lie still and hold your breath for 2-3 seconds while a picture of your heart is being taken. The procedure may vary among health care providers and hospitals. What happens after the procedure?  You can get dressed.  You can return to your normal activities.  It is up to you to get the results of your test. Ask your health care provider, or the department that is doing the test, when your results will be ready. Summary  A coronary calcium scan is an imaging test used to look for deposits of calcium and other fatty materials (plaques) in the inner lining of the blood vessels of the heart (coronary arteries).  Generally, this is a safe procedure. Tell your health care provider if you are pregnant or may be pregnant.  No preparation is needed for this procedure.  A CT scanner will take pictures of your heart.  You can return to your normal activities after the scan is done. This information is not intended to replace advice given to you by your health care provider. Make sure you discuss any questions you have with your health care provider. Document Released: 04/19/2008 Document Revised: 09/10/2016 Document Reviewed: 09/10/2016 Elsevier Interactive Patient Education  2017 [place]: At [facility], you and your health needs are our priority.  As part of our continuing mission to provide you with exceptional heart care, we have created designated Provider Care Teams.  These Care Teams include your primary Cardiologist (physician) and  Advanced Practice Providers (APPs -  Physician Assistants and Nurse Practitioners) who all work together to provide you with the care you need, when you need it.     Your next appointment:   1 to 2 month(s)  The format for your next appointment:   virtual or in person  Provider:   Glenetta Hew, MD      Studies Ordered:   Orders Placed This Encounter  Procedures  . CT CARDIAC SCORING (SELF PAY ONLY)  . EKG 12-Lead  . ECHOCARDIOGRAM COMPLETE     Glenetta Hew, M.D., M.S. Interventional Cardiologist   Pager # [phone] Phone # [phone] [address] [place], [place] [address]   Thank you for choosing [facility] at [facility]!!

## 2021-03-12 NOTE — Assessment & Plan Note (Signed)
Hyperlipidemia associated with Hypertension & DM-2 along with Obesity = Metabolic Syndrome.  Will assess baseline CV risk with Coronary Calcium Score.

## 2021-03-12 NOTE — Assessment & Plan Note (Signed)
Aortic Atherosclerosis noted on CT with HTN, HLD & DM-2.  Plan - check 2 D Echo for sign of Aortic Dilation / Aneurysm  Continue atherosclerosis Rx with ASA, beta blocker & statin  Baseline CV evaluation with Coronary Calcium Score

## 2021-03-13 NOTE — Assessment & Plan Note (Addendum)
On associated hunger.  As an anginal equivalent, we were consistent to be postprandial dyspnea and then hunger related.  Need to exclude CHF and chronic abnormality. Check 2D echo. Based on Cardiovascular Risk with Coronary Calcium Score

## 2021-03-13 NOTE — Assessment & Plan Note (Signed)
A1c recently 6.6 - not currently on meds; diet controlled.

## 2021-03-13 NOTE — Assessment & Plan Note (Signed)
BP controlled on combination of Beta blocker & HCTZ

## 2021-03-28 ENCOUNTER — Encounter: Payer: Self-pay | Admitting: Hematology

## 2021-04-14 ENCOUNTER — Telehealth: Payer: Self-pay | Admitting: Hematology

## 2021-04-14 NOTE — Telephone Encounter (Signed)
Scheduled appointment per 06/10 sch msg. Patient is aware. 

## 2021-04-18 ENCOUNTER — Ambulatory Visit
Admission: RE | Admit: 2021-04-18 | Discharge: 2021-04-18 | Disposition: A | Discharge status: Home / Self Care | Payer: BC Managed Care – PPO | Source: Ambulatory Visit | Attending: Obstetrics and Gynecology | Admitting: Obstetrics and Gynecology

## 2021-04-18 ENCOUNTER — Other Ambulatory Visit: Payer: Self-pay

## 2021-04-18 ENCOUNTER — Ambulatory Visit: Payer: BC Managed Care – PPO

## 2021-04-18 ENCOUNTER — Inpatient Hospital Stay: Payer: BC Managed Care – PPO | Attending: Hematology

## 2021-04-18 DIAGNOSIS — C210 Malignant neoplasm of anus, unspecified: Secondary | ICD-10-CM

## 2021-04-18 DIAGNOSIS — M810 Age-related osteoporosis without current pathological fracture: Secondary | ICD-10-CM | POA: Diagnosis not present

## 2021-04-18 DIAGNOSIS — N644 Mastodynia: Secondary | ICD-10-CM

## 2021-04-18 DIAGNOSIS — E119 Type 2 diabetes mellitus without complications: Secondary | ICD-10-CM | POA: Diagnosis not present

## 2021-04-18 DIAGNOSIS — I10 Essential (primary) hypertension: Secondary | ICD-10-CM | POA: Diagnosis not present

## 2021-04-18 DIAGNOSIS — R922 Inconclusive mammogram: Secondary | ICD-10-CM | POA: Diagnosis not present

## 2021-04-18 DIAGNOSIS — Z85048 Personal history of other malignant neoplasm of rectum, rectosigmoid junction, and anus: Secondary | ICD-10-CM | POA: Diagnosis not present

## 2021-04-18 LAB — CBC WITH DIFFERENTIAL (CANCER CENTER ONLY)
Abs Immature Granulocytes: 0.03 10*3/uL (ref 0.00–0.07)
Basophils Absolute: 0 10*3/uL (ref 0.0–0.1)
Basophils Relative: 1 %
Eosinophils Absolute: 0.1 10*3/uL (ref 0.0–0.5)
Eosinophils Relative: 2 %
HCT: 38.6 % (ref 36.0–46.0)
Hemoglobin: 13.3 g/dL (ref 12.0–15.0)
Immature Granulocytes: 1 %
Lymphocytes Relative: 15 %
Lymphs Abs: 0.9 10*3/uL (ref 0.7–4.0)
MCH: 34.1 pg — ABNORMAL HIGH (ref 26.0–34.0)
MCHC: 34.5 g/dL (ref 30.0–36.0)
MCV: 99 fL (ref 80.0–100.0)
Monocytes Absolute: 0.7 10*3/uL (ref 0.1–1.0)
Monocytes Relative: 12 %
Neutro Abs: 4 10*3/uL (ref 1.7–7.7)
Neutrophils Relative %: 69 %
Platelet Count: 253 10*3/uL (ref 150–400)
RBC: 3.9 MIL/uL (ref 3.87–5.11)
RDW: 13 % (ref 11.5–15.5)
WBC Count: 5.7 10*3/uL (ref 4.0–10.5)
nRBC: 0 % (ref 0.0–0.2)

## 2021-04-18 LAB — CMP (CANCER CENTER ONLY)
ALT: 63 U/L — ABNORMAL HIGH (ref 0–44)
AST: 40 U/L (ref 15–41)
Albumin: 4.1 g/dL (ref 3.5–5.0)
Alkaline Phosphatase: 109 U/L (ref 38–126)
Anion gap: 8 (ref 5–15)
BUN: 20 mg/dL (ref 8–23)
CO2: 25 mmol/L (ref 22–32)
Calcium: 9.8 mg/dL (ref 8.9–10.3)
Chloride: 109 mmol/L (ref 98–111)
Creatinine: 0.77 mg/dL (ref 0.44–1.00)
GFR, Estimated: >60 mL/min (ref >60)
Glucose, Bld: 148 mg/dL — ABNORMAL HIGH (ref 70–99)
Potassium: 3.9 mmol/L (ref 3.5–5.1)
Sodium: 142 mmol/L (ref 135–145)
Total Bilirubin: 0.5 mg/dL (ref 0.3–1.2)
Total Protein: 7.6 g/dL (ref 6.5–8.1)

## 2021-04-20 ENCOUNTER — Ambulatory Visit: Payer: 59 | Admitting: Hematology

## 2021-04-24 ENCOUNTER — Other Ambulatory Visit (HOSPITAL_COMMUNITY): Payer: BC Managed Care – PPO

## 2021-04-24 ENCOUNTER — Inpatient Hospital Stay: Admission: RE | Admit: 2021-04-24 | Payer: Self-pay | Source: Ambulatory Visit

## 2021-04-27 ENCOUNTER — Encounter: Payer: Self-pay | Admitting: Hematology

## 2021-04-27 ENCOUNTER — Telehealth: Payer: BC Managed Care – PPO | Admitting: Cardiology

## 2021-05-01 ENCOUNTER — Other Ambulatory Visit: Payer: Self-pay

## 2021-05-01 ENCOUNTER — Ambulatory Visit (HOSPITAL_COMMUNITY)
Admission: RE | Admit: 2021-05-01 | Discharge: 2021-05-01 | Disposition: A | Discharge status: Home / Self Care | Payer: BC Managed Care – PPO | Source: Ambulatory Visit | Attending: Nurse Practitioner | Admitting: Nurse Practitioner

## 2021-05-01 ENCOUNTER — Encounter (HOSPITAL_COMMUNITY): Payer: Self-pay

## 2021-05-01 DIAGNOSIS — K5730 Diverticulosis of large intestine without perforation or abscess without bleeding: Secondary | ICD-10-CM | POA: Diagnosis not present

## 2021-05-01 DIAGNOSIS — C189 Malignant neoplasm of colon, unspecified: Secondary | ICD-10-CM | POA: Diagnosis not present

## 2021-05-01 DIAGNOSIS — K769 Liver disease, unspecified: Secondary | ICD-10-CM | POA: Diagnosis not present

## 2021-05-01 DIAGNOSIS — C210 Malignant neoplasm of anus, unspecified: Secondary | ICD-10-CM | POA: Diagnosis not present

## 2021-05-01 MED ORDER — IOHEXOL 300 MG/ML  SOLN
100.0000 mL | Freq: Once | INTRAMUSCULAR | Status: AC | PRN
  Administered 2021-05-01: 100 mL via INTRAVENOUS

## 2021-05-01 MED ORDER — SODIUM CHLORIDE (PF) 0.9 % IJ SOLN
INTRAMUSCULAR | Status: AC
  Filled 2021-05-01: qty 50

## 2021-05-04 ENCOUNTER — Encounter: Payer: Self-pay | Admitting: Hematology

## 2021-05-04 ENCOUNTER — Other Ambulatory Visit: Payer: Self-pay

## 2021-05-04 ENCOUNTER — Inpatient Hospital Stay: Payer: BC Managed Care – PPO | Admitting: Hematology

## 2021-05-04 VITALS — BP 151/75 | HR 88 | Temp 97.3°F | Resp 18 | Ht 61.0 in | Wt 166.8 lb

## 2021-05-04 DIAGNOSIS — C210 Malignant neoplasm of anus, unspecified: Secondary | ICD-10-CM

## 2021-05-04 NOTE — Progress Notes (Signed)
[name]   Telephone:([phone]) [phone] Fax:(336) [phone]   Clinic Follow up Note   Patient Care Team: Shirline Frees, MD as PCP - General (Family Medicine) Megan Salon, MD as Consulting Physician (Gynecology) Leighton Ruff, MD as Consulting Physician (General Surgery)  Date of Service:  05/04/2021  CHIEF COMPLAINT: f/u of anal cancer  SUMMARY OF ONCOLOGIC HISTORY: Oncology History  Anal cancer ([place])  03/19/2018 Genetic Testing   Genetic testing performed through [facility]'s Common Hereditary Cancers Panel reported out on 03/10/2018 showed no pathogenic mutations. The Common Hereditary Cancer Panel offered by [facility] includes sequencing and/or deletion duplication testing of the following 47 genes: APC, ATM, AXIN2, BARD1, BMPR1A, BRCA1, BRCA2, BRIP1, CDH1, CDKN2A (p14ARF), CDKN2A (p16INK4a), CKD4, CHEK2, CTNNA1, DICER1, EPCAM (Deletion/duplication testing only), GREM1 (promoter region deletion/duplication testing only), KIT, MEN1, MLH1, MSH2, MSH3, MSH6, MUTYH, NBN, NF1, NHTL1, PALB2, PDGFRA, PMS2, POLD1, POLE, PTEN, RAD50, RAD51C, RAD51D, SDHB, SDHC, SDHD, SMAD4, SMARCA4. STK11, TP53, TSC1, TSC2, and VHL.  The following genes were evaluated for sequence changes only: SDHA and HOXB13 c.251G>A variant only.   A variant of uncertain significance (VUS) in a gene called PMS2 was also noted. c.1399G>A (p.Val467Ile)   08/14/2018 Imaging   MRI PELVIS W WO Contrast 08/14/18 IMPRESSION: 1. Centrally necrotic 1.8 cm enlarged lymph node versus nonspecific soft tissue mass in the left hemipelvis adjacent to the posterior pelvic sidewall. Further evaluation with biopsy and/or PET-CT is recommended.    09/17/2018 Pathology Results   Diagnosis 09/17/18 Lymph node, needle/core biopsy, left pelvic - SQUAMOUS CELL CARCINOMA. - SEE COMMENT. Microscopic Comment    10/09/2018 PET scan   PET 10/09/18  IMPRESSION: 1. 17 mm left pelvic sidewall lesion is markedly  hypermetabolic compatible with known malignancy. 2. No additional definite hypermetabolic disease identified in the neck, chest, abdomen, or pelvis. 3. Relatively diffuse FDG accumulation in the colon without focal increased uptake above background colonic levels in the region of the anus. 4. Focal FDG accumulation identified along the anterior left femoral neck and in the lumbosacral junction. No underlying bone lesions evident at these locations on CT images. Indeterminate finding although metastatic disease cannot be excluded.    10/13/2018 Initial Biopsy   Diagnosis 10/13/18 Anus, biopsy - SQUAMOUS CELL CARCINOMA. - SEE COMMENT.    10/14/2018 Initial Diagnosis   Anal cancer ([place])    10/14/2018 Cancer Staging   Staging form: Anus, AJCC 8th Edition - Clinical stage from 10/14/2018: Stage IIIA (cT2, cN1b, cM0) - Signed by Truitt Merle, MD on 10/14/2018    11/03/2018 - 12/15/2018 Radiation Therapy   Concurrent chemoRt with Dr. Lisbeth Renshaw for 5-6 weeks starting 11/03/18. Plans to complete on 12/15/18    11/03/2018 - 12/15/2018 Chemotherapy   Concurrent chemoRt with Mitomycin and 5-FU on week 1 and week 5 starting 11/03/18. Week 5 chemo on 12/01/18    04/14/2019 PET scan   PET 04/14/19  IMPRESSION: 1. The hypermetabolic 17 mm left pelvic sidewall lesion seen on the previous study has resolved in the interval. No new or progressive hypermetabolic disease in the neck, chest, abdomen, or pelvis on today's study. 2. Hepatic steatosis. 3. Colonic diverticulosis without diverticulitis.     04/14/2020 Imaging   CT CAP w contrast IMPRESSION: 1. Thickening and stranding about the anus, see PET-CT for further detail. No mass lesion grossly on this evaluation. 2. No evidence of metastatic disease in the chest, abdomen or pelvis. 3. Marked hepatic steatosis. Signs of recanalization of the umbilical vein, correlate with any clinical signs  of liver disease. This is indicative of portal  hypertension but is seen in the absence of splenomegaly. Note that the portal vein is patent into the liver. 4. Recanalized umbilical vein with varix at the umbilicus. 5. Aortic atherosclerosis.   Aortic Atherosclerosis (ICD10-I70.0).   05/01/2021 Imaging   CT A/P w contrast  IMPRESSION: 1. No evidence of anal carcinoma recurrence or metastasis. 2. No abdominopelvic lymphadenopathy. 3. Scattered colonic diverticulosis without diverticulitis.      CURRENT THERAPY:  Surveillance  INTERVAL HISTORY:  Angelica Wright is here for a follow up of anal cancer. She was last seen by me a year ago, and by NP Lacie 6 months ago. She presents to the clinic accompanied by her husband. She is working to change her diet to eliminate taking cholesterol medication. She reports continued 4-5 bowel movements a day, with blood only when she wipes and that's all (this also depends on size of stool). She mentions her insurance company ([employer], which she obtained since her last visit) recommended she try a diet that is essentially a Keto diet. She wonders if this is a good idea and if she should try it. She reports she had EGD in the interim, and this was normal. She also notes she had mammogram recently, which she stays on top of due to her family history of breast cancer on both sides of her family.  All other systems were reviewed with the patient and are negative.  MEDICAL HISTORY:  Past Medical History:  Diagnosis Date   Aortic atherosclerosis ([place])    Essential hypertension    Hyperlipidemia associated with type 2 diabetes mellitus ([place])    Hyperparathyroidism ([place])    resolved after surgery   Nonalcoholic steatohepatitis ([facility])    Osteoarthritis    Hands, feet, knees   Osteoporosis    of the spine   Rectocele    Sensorineural hearing loss (SNHL) of both ears    Squamous cell cancer, anus ([place])    (Dr. Ermelinda Das -oncology) -radiation and chemo therapy   TMJ syndrome    Type 2  Diabetes ([place]) 11/2013   controlled with diet/exercise    SURGICAL HISTORY: Past Surgical History:  Procedure Laterality Date   LYMPH NODE BIOPSY     PARATHYROIDECTOMY  2000   THERAPEUTIC ABORTION     x 2   VULVA /PERINEUM BIOPSY  2012    I have reviewed the social history and family history with the patient and they are unchanged from previous note.  ALLERGIES:  is allergic to polymyxin b-trimethoprim and sulfa antibiotics.  MEDICATIONS:  Current Outpatient Medications  Medication Sig Dispense Refill   alendronate (FOSAMAX) 70 MG tablet Take 1 tablet (70 mg total) by mouth every 7 (seven) days. Take first thing in am with 6 oz. Water.  Be upright after taking.  Eat nothing for one hour. 12 tablet 3   aspirin 81 MG chewable tablet Chew 81 mg by mouth daily.     atenolol (TENORMIN) 100 MG tablet Take 100 mg by mouth daily.     atorvastatin (LIPITOR) 20 MG tablet Take 1 tablet by mouth daily.     Blood Glucose Monitoring Suppl (ACCU-CHEK GUIDE) w/Device KIT daily.     calcium carbonate (TUMS - DOSED IN MG ELEMENTAL CALCIUM) 500 MG chewable tablet Chew 1 tablet by mouth daily as needed for indigestion or heartburn. PRN     Cholecalciferol (VITAMIN D3) 125 MCG (5000 UT) CAPS Take 5,000 Units by mouth daily.  clobetasol ointment (TEMOVATE) 0.05 % Apply a pea sized amount topically 1-2 x a week and  BID x 1-2 weeks prn flare 30 g 1   Estradiol (YUVAFEM) 10 MCG TABS vaginal tablet Place one tablet vaginally 2 x a week 24 tablet 3   hydrochlorothiazide (HYDRODIURIL) 12.5 MG tablet Take 12.5 mg by mouth daily.      ibuprofen (ADVIL,MOTRIN) 100 MG tablet Take 400-600 mg by mouth 2 (two) times daily as needed for pain.     Lancets (ACCU-CHEK MULTICLIX) lancets See admin instructions.     silver sulfADIAZINE (SILVADENE) 1 % cream Apply 1 application topically daily. 50 g 0   traZODone (DESYREL) 50 MG tablet Take 50 mg by mouth as needed.     valACYclovir (VALTREX) 500 MG tablet Take one tab  po BID for 3 days with flares. 30 tablet 1   vitamin C (ASCORBIC ACID) 500 MG tablet Take 500 mg by mouth daily.     No current facility-administered medications for this visit.    PHYSICAL EXAMINATION: ECOG PERFORMANCE STATUS: 0 - Asymptomatic  Vitals:   05/04/21 0841  BP: (!) 151/75  Pulse: 88  Resp: 18  Temp: (!) 97.3 F (36.3 C)  SpO2: 94%   Filed Weights   05/04/21 0841  Weight: 166 lb 12.8 oz (75.7 kg)    GENERAL:alert, no distress and comfortable SKIN: skin color, texture, turgor are normal, no rashes or significant lesions EYES: normal, Conjunctiva are pink and non-injected, sclera clear  NECK: supple, thyroid normal size, non-tender, without nodularity LYMPH:  no palpable lymphadenopathy in the cervical, axillary or inguinal LUNGS: clear to auscultation and percussion with normal breathing effort HEART: regular rate & rhythm and no murmurs and no lower extremity edema ABDOMEN:abdomen soft, non-tender and normal bowel sounds Musculoskeletal:no cyanosis of digits and no clubbing  NEURO: alert & oriented x 3 with fluent speech, no focal motor/sensory deficits RECTAL: No palpable mass. Mild external hemorrhoids present. No blood on glove. Normal stool present. Benign exam    LABORATORY DATA:  I have reviewed the data as listed CBC Latest Ref Rng & Units 04/18/2021 10/18/2020 04/14/2020  WBC 4.0 - 10.5 K/uL 5.7 5.7 4.9  Hemoglobin 12.0 - 15.0 g/dL 13.3 13.1 13.5  Hematocrit 36.0 - 46.0 % 38.6 39.0 39.8  Platelets 150 - 400 K/uL 253 260 262     CMP Latest Ref Rng & Units 04/18/2021 10/18/2020 04/14/2020  Glucose 70 - 99 mg/dL 148(H) 127(H) 138(H)  BUN 8 - 23 mg/dL _0 Creatinine 0.44 - 1.00 mg/dL 0.77 0.79 0.85  Sodium 135 - 145 mmol/L 142 141 144  Potassium 3.5 - 5.1 mmol/L 3.9 4.5 3.9  Chloride 98 - 111 mmol/L 109 109 106  CO2 22 - 32 mmol/L _1 Calcium 8.9 - 10.3 mg/dL 9.8 10.0 9.7  Total Protein 6.5 - 8.1 g/dL 7.6 7.7 7.6  Total Bilirubin 0.3 -  1.2 mg/dL 0.5 0.7 0.5  Alkaline Phos 38 - 126 U/L 109 95 96  AST 15 - 41 U/L 40 29 42(H)  ALT 0 - 44 U/L 63(H) 43 61(H)      RADIOGRAPHIC STUDIES: I have personally reviewed the radiological images as listed and agreed with the findings in the report. No results found.   ASSESSMENT & PLAN:  Angelica Wright is a 65 y.o. female with   1. Anal Cancer, with positive left pelvic lymph node, cT2N1bM0, stage IIIA -She was diagnosed in 09/2018, and completed concurrent  chemoRT on 12/25/2018. She has recovered well.  -Her 03/2018 genetic testing was negative except for VUS of gene PMS2. -Based on 04/14/19 PET she had complete radiographic response -CT CAP from 04/14/20 showed no evidence of disease and thickening around her anus likely from RT. Scan also shows severe fatty liver. -I personally reviewed her CT A/P from 05/01/21 with her, which showed no evidence of recurrence, metastasis, or lymphadenopathy. -She is clinically doing well. Physical exam unremarkable except mild external hemorrhoids. She does note occasional blood on tissue when she wipes. Her last scope with Dr. Marcello Moores was also normal, per her report. -F/u in 6 months. She will schedule follow up with Dr. Marcello Moores for 07/2021.   2. HTN and DM -Continue medication, not on diabetic medication. She is being managed by her PCP  -Her blood sugar from 04/18/21 was slightly elevated at 142. She has worked to change her diet, such as adding more fiber. We discussed the Keto diet, which was recommended to her by her insurance company Nurse, mental health). I think could be beneficial for her, including to help get better control of her sugars.   3. 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 Overall pain is minimal -using vaginal dilator which is improving her intimacy and sexuality issues  -Her GI  symptoms have overall improved, and her only remaining complaint, aside from the number of bowel movements per day, is some mild abdominal bloating/pain.    4. Cancer Screenings, Osteoporosis -Her 10/2019 DEXA showed osteoporosis with lowest T-score -3 at left femur neck.  -Most recent Pap 12/2020, negative -Most recent mammogram 04/2021, negative     PLAN:  -lab and CT scan reviewed, NED -Lab and F/u in 6 months, she will seee Dr. Marcello Moores in 3 months     No problem-specific Assessment & Plan notes found for this encounter.   No orders of the defined types were placed in this encounter.  All questions were answered. The patient knows to call the clinic with any problems, questions or concerns. No barriers to learning was detected. The total time spent in the appointment was 30 minutes.     Truitt Merle, MD 05/04/2021   I, Wilburn Mylar, am acting as scribe for Truitt Merle, MD.   I have reviewed the above documentation for accuracy and completeness, and I agree with the above.

## 2021-05-05 ENCOUNTER — Encounter: Payer: Self-pay | Admitting: Obstetrics and Gynecology

## 2021-05-13 DIAGNOSIS — E118 Type 2 diabetes mellitus with unspecified complications: Secondary | ICD-10-CM | POA: Diagnosis not present

## 2021-05-31 ENCOUNTER — Telehealth (HOSPITAL_COMMUNITY): Payer: Self-pay | Admitting: Cardiology

## 2021-05-31 NOTE — Telephone Encounter (Signed)
Will send this message to pts ordering Provider and primary covering RN, as an FYI.

## 2021-05-31 NOTE — Telephone Encounter (Signed)
Patient cancelled echocardiogram for the reason below:  05/31/2021 9:17 AM PH:[phone], Angelica Wright  Cancel Rsn: Patient (financial reasons)  Order will be removed from the ECHO WQ and when patient calls back to reschedule we can reinstate the order. Thank you

## 2021-06-01 NOTE — Telephone Encounter (Signed)
That is fine  Angelica Hew, MD

## 2021-06-02 ENCOUNTER — Other Ambulatory Visit (HOSPITAL_COMMUNITY): Payer: BC Managed Care – PPO

## 2021-06-02 ENCOUNTER — Other Ambulatory Visit: Payer: Self-pay

## 2021-07-04 ENCOUNTER — Telehealth: Payer: Self-pay | Admitting: *Deleted

## 2021-07-04 NOTE — Telephone Encounter (Signed)
Left message for patient to call back -would like to switch appointment to in person rather than virtual 07/13/21 with Dr Ellyn Hack .

## 2021-07-12 ENCOUNTER — Encounter: Payer: Self-pay | Admitting: Obstetrics and Gynecology

## 2021-07-12 ENCOUNTER — Other Ambulatory Visit: Payer: Self-pay

## 2021-07-12 ENCOUNTER — Ambulatory Visit: Payer: BC Managed Care – PPO | Admitting: Obstetrics and Gynecology

## 2021-07-12 VITALS — BP 118/82 | HR 58 | Ht 61.0 in | Wt 168.0 lb

## 2021-07-12 DIAGNOSIS — B372 Candidiasis of skin and nail: Secondary | ICD-10-CM | POA: Diagnosis not present

## 2021-07-12 MED ORDER — NYSTATIN 100000 UNIT/GM EX CREA: 1.0000 "application " | TOPICAL_CREAM | Freq: Two times a day (BID) | CUTANEOUS | 1 refills | Status: DC

## 2021-07-12 NOTE — Progress Notes (Signed)
GYNECOLOGY  VISIT   HPI: 65 y.o.   Married Other or two or more races Not Hispanic or Latino  female   [phone] with Patient's last menstrual period was 11/05/2002 (approximate).   here for rash in groin and under breast.  The rash under her breast started a month ago, itchy and uncomfortable. Recently started to get a rash in her left groin.   GYNECOLOGIC HISTORY: Patient's last menstrual period was 11/05/2002 (approximate). Contraception:pmp  Menopausal hormone therapy: Yes, estradiol vaginal tablets         OB History     Gravida  4   Para  2   Term  2   Preterm  0   AB  2   Living  2      SAB  0   IAB  2   Ectopic  0   Multiple  0   Live Births  2              Patient Active Problem List   Diagnosis Date Noted   Shortness of breath 03/09/2021   Age-related osteoporosis without current pathological fracture 12/22/2020   Anxiety 12/22/2020   Hardening of the aorta (main artery of the heart) ([place]) 12/22/2020   Hypoparathyroidism ([place]) 12/22/2020   Insomnia 12/22/2020   Localized edema 12/22/2020   Microscopic hematuria 12/22/2020   Osteoarthritis of knee 12/22/2020   Localized osteoporosis without current pathological fracture 12/22/2020   Pure hypercholesterolemia 12/22/2020   Restless legs 12/22/2020   Type 2 diabetes mellitus without complication ([place]) 81/15/7262   Vitamin D deficiency 12/22/2020   Fatty (change of) liver, not elsewhere classified 04/18/2020   Anal cancer ([place]) 10/14/2018   Diabetes ([place]) 09/29/2018   Genetic testing 03/19/2018   Family history of breast cancer    Family history of uterine cancer    Osteopenia 07/06/2015   Essential hypertension 07/06/2015   Atrophic vaginitis 03/55/9741   Lichen sclerosus et atrophicus of the vulva 07/06/2015    Past Medical History:  Diagnosis Date   Aortic atherosclerosis ([place])    Essential hypertension    Hyperlipidemia associated with type 2 diabetes mellitus ([place])     Hyperparathyroidism ([place])    resolved after surgery   Nonalcoholic steatohepatitis ([facility])    Osteoarthritis    Hands, feet, knees   Osteoporosis    of the spine   Rectocele    Sensorineural hearing loss (SNHL) of both ears    Squamous cell cancer, anus ([place])    (Dr. Ermelinda Das -oncology) -radiation and chemo therapy   TMJ syndrome    Type 2 Diabetes ([place]) 11/2013   controlled with diet/exercise    Past Surgical History:  Procedure Laterality Date   LYMPH NODE BIOPSY     PARATHYROIDECTOMY  2000   THERAPEUTIC ABORTION     x 2   VULVA /PERINEUM BIOPSY  2012    Current Outpatient Medications  Medication Sig Dispense Refill   alendronate (FOSAMAX) 70 MG tablet Take 1 tablet (70 mg total) by mouth every 7 (seven) days. Take first thing in am with 6 oz. Water.  Be upright after taking.  Eat nothing for one hour. 12 tablet 3   aspirin 81 MG chewable tablet Chew 81 mg by mouth daily.     atenolol (TENORMIN) 100 MG tablet Take 100 mg by mouth daily.     atorvastatin (LIPITOR) 20 MG tablet Take 1 tablet by mouth daily.     Blood Glucose Monitoring Suppl (ACCU-CHEK GUIDE) w/Device KIT daily.  calcium carbonate (TUMS - DOSED IN MG ELEMENTAL CALCIUM) 500 MG chewable tablet Chew 1 tablet by mouth daily as needed for indigestion or heartburn. PRN     Cholecalciferol (VITAMIN D3) 125 MCG (5000 UT) CAPS Take 5,000 Units by mouth daily.     clobetasol ointment (TEMOVATE) 0.05 % Apply a pea sized amount topically 1-2 x a week and  BID x 1-2 weeks prn flare 30 g 1   Estradiol (YUVAFEM) 10 MCG TABS vaginal tablet Place one tablet vaginally 2 x a week 24 tablet 3   hydrochlorothiazide (HYDRODIURIL) 12.5 MG tablet Take 12.5 mg by mouth daily.      ibuprofen (ADVIL,MOTRIN) 100 MG tablet Take 400-600 mg by mouth 2 (two) times daily as needed for pain.     Lancets (ACCU-CHEK MULTICLIX) lancets See admin instructions.     silver sulfADIAZINE (SILVADENE) 1 % cream Apply 1 application topically daily. 50 g 0    traZODone (DESYREL) 50 MG tablet Take 50 mg by mouth as needed.     valACYclovir (VALTREX) 500 MG tablet Take one tab po BID for 3 days with flares. 30 tablet 1   vitamin C (ASCORBIC ACID) 500 MG tablet Take 500 mg by mouth daily.     No current facility-administered medications for this visit.     ALLERGIES: Polymyxin b-trimethoprim and Sulfa antibiotics  Family History  Problem Relation Age of Onset   Breast cancer Mother 17       lumpectomy, x2 in other breast in late 80's   Endometrial cancer Mother 90   Breast cancer Maternal Aunt        dx in early 12's, x2 in late 80's   Breast cancer Paternal Aunt 91   Breast cancer Paternal Aunt 2   Cirrhosis Father        ETOH related   Liver cancer Father    Lung cancer Father        tobacco user   Liver disease Father    Stroke Maternal Grandmother    Stroke Maternal Grandfather    Emphysema Paternal Grandmother    Diabetes Paternal Grandfather    Breast cancer Cousin 28    Social History   Socioeconomic History   Marital status: Married    Spouse name: Not on file   Number of children: 2   Years of education: Not on file   Highest education level: Some college, no degree  Occupational History   Occupation: Therapist, sports: [employer]  Tobacco Use   Smoking status: Never   Smokeless tobacco: Never  Vaping Use   Vaping Use: Never used  Substance and Sexual Activity   Alcohol use: Yes    Alcohol/week: 7.0 standard drinks    Types: 7 Glasses of wine per week   Drug use: No   Sexual activity: Yes    Partners: Male    Birth control/protection: Other-see comments    Comment: vasectomy-husband  Other Topics Concern   Not on file  Social History Narrative   Does not exercise routinely because of bone-on-bone osteoarthritis pains in her knees.-Tries to walk much as she can.   [employer]   Social Determinants of Radio broadcast assistant Strain: Not on file  Food Insecurity: Not on file   Transportation Needs: Not on file  Physical Activity: Not on file  Stress: Not on file  Social Connections: Not on file  Intimate Partner Violence: Not on file    Review of  Systems  Skin:  Positive for rash.   PHYSICAL EXAMINATION:    LMP 11/05/2002 (Approximate)     General appearance: alert, cooperative and appears stated age Skin: ~10 cm patch of very erythematous skin under her right breast, small patchy rash under her left breast, erythema in her left groin.   1. Candidal intertrigo - nystatin cream (MYCOSTATIN); Apply 1 application topically 2 (two) times daily. Apply to affected area BID for up to 7 days.  Dispense: 30 g; Refill: 1

## 2021-07-12 NOTE — Patient Instructions (Signed)
Skin Yeast Infection A skin yeast infection is a condition in which there is an overgrowth of yeast (Candida) that normally lives on the skin. This condition usually occurs in areas of the skin that are constantly warm and moist, such as the skin under the breasts or armpits, or in the groin and other body folds. What are the causes? This condition is caused by a change in the normal balance of the yeast that live on the skin. What increases the risk? You are more likely to develop this condition if you: Are obese. Are pregnant. Are 89 years of age or older. Wear tight clothing. Have any of the following conditions: Diabetes. Malnutrition. A weak body defense system (immune system). Take medicines such as: Birth control pills. Antibiotics. Steroid medicines. What are the signs or symptoms? The most common symptom of this condition is itchiness in the affected area. Other symptoms include: A red, swollen area of the skin. Bumps on the skin. How is this diagnosed? This condition is diagnosed with a medical history and physical exam. Your health care provider may check for yeast by taking scrapings of the skin to be viewed under a microscope. How is this treated? This condition is treated with medicine. Medicines may be prescribed or available over the counter. The medicines may be: Taken by mouth (orally). Applied as a cream or powder to your skin. Follow these instructions at home:  Take or apply over-the-counter and prescription medicines only as told by your health care provider. Maintain a healthy weight. If you need help losing weight, talk with your health care provider. Keep your skin clean and dry. Wear loose-fitting clothing. If you have diabetes, keep your blood sugar under control. Keep all follow-up visits. This is important. Contact a health care provider if: Your symptoms go away and then come back. Your symptoms do not get better with treatment. Your symptoms get  worse. Your rash spreads. You have a fever or chills. You have new symptoms. You have new warmth or redness of your skin. Your rash is painful or bleeding. Summary A skin yeast infection is a condition in which there is an overgrowth of yeast (Candida) that normally lives on the skin. Take or apply over-the-counter and prescription medicines only as told by your health care provider. Keep your skin clean and dry. Contact a health care provider if your symptoms do not get better with treatment. This information is not intended to replace advice given to you by your health care provider. Make sure you discuss any questions you have with your health care provider. Document Revised: 01/10/2021 Document Reviewed: 01/10/2021 Elsevier Patient Education  [place].

## 2021-07-13 ENCOUNTER — Telehealth: Payer: BC Managed Care – PPO | Admitting: Cardiology

## 2021-07-25 DIAGNOSIS — Z85048 Personal history of other malignant neoplasm of rectum, rectosigmoid junction, and anus: Secondary | ICD-10-CM | POA: Diagnosis not present

## 2021-08-14 DIAGNOSIS — Z23 Encounter for immunization: Secondary | ICD-10-CM | POA: Diagnosis not present

## 2021-08-14 DIAGNOSIS — H9313 Tinnitus, bilateral: Secondary | ICD-10-CM | POA: Diagnosis not present

## 2021-08-14 DIAGNOSIS — I10 Essential (primary) hypertension: Secondary | ICD-10-CM | POA: Diagnosis not present

## 2021-08-14 DIAGNOSIS — E7800 Pure hypercholesterolemia, unspecified: Secondary | ICD-10-CM | POA: Diagnosis not present

## 2021-08-14 DIAGNOSIS — E119 Type 2 diabetes mellitus without complications: Secondary | ICD-10-CM | POA: Diagnosis not present

## 2021-08-14 DIAGNOSIS — Z0000 Encounter for general adult medical examination without abnormal findings: Secondary | ICD-10-CM | POA: Diagnosis not present

## 2021-08-16 ENCOUNTER — Other Ambulatory Visit: Payer: BC Managed Care – PPO

## 2021-08-28 DIAGNOSIS — F4323 Adjustment disorder with mixed anxiety and depressed mood: Secondary | ICD-10-CM | POA: Diagnosis not present

## 2021-09-08 DIAGNOSIS — F4323 Adjustment disorder with mixed anxiety and depressed mood: Secondary | ICD-10-CM | POA: Diagnosis not present

## 2021-09-22 DIAGNOSIS — F4323 Adjustment disorder with mixed anxiety and depressed mood: Secondary | ICD-10-CM | POA: Diagnosis not present

## 2021-09-30 ENCOUNTER — Other Ambulatory Visit: Payer: Self-pay | Admitting: Obstetrics and Gynecology

## 2021-09-30 DIAGNOSIS — N952 Postmenopausal atrophic vaginitis: Secondary | ICD-10-CM

## 2021-10-02 NOTE — Telephone Encounter (Signed)
Annual exam scheduled on 12/27/20 Last annual exam was on 12/22/20 Mammogram 6/22

## 2021-10-06 DIAGNOSIS — F4323 Adjustment disorder with mixed anxiety and depressed mood: Secondary | ICD-10-CM | POA: Diagnosis not present

## 2021-10-20 DIAGNOSIS — F4323 Adjustment disorder with mixed anxiety and depressed mood: Secondary | ICD-10-CM | POA: Diagnosis not present

## 2021-11-02 ENCOUNTER — Inpatient Hospital Stay: Payer: BC Managed Care – PPO | Admitting: Hematology

## 2021-11-02 ENCOUNTER — Inpatient Hospital Stay: Payer: BC Managed Care – PPO

## 2021-11-09 ENCOUNTER — Encounter: Payer: Self-pay | Admitting: Hematology

## 2021-11-16 ENCOUNTER — Inpatient Hospital Stay: Payer: Managed Care, Other (non HMO) | Attending: Hematology

## 2021-11-16 ENCOUNTER — Other Ambulatory Visit: Payer: Self-pay

## 2021-11-16 ENCOUNTER — Encounter: Payer: Self-pay | Admitting: Hematology

## 2021-11-16 ENCOUNTER — Inpatient Hospital Stay (HOSPITAL_BASED_OUTPATIENT_CLINIC_OR_DEPARTMENT_OTHER): Payer: Managed Care, Other (non HMO) | Admitting: Hematology

## 2021-11-16 VITALS — BP 137/84 | HR 72 | Temp 97.9°F | Resp 17 | Wt 168.8 lb

## 2021-11-16 DIAGNOSIS — M810 Age-related osteoporosis without current pathological fracture: Secondary | ICD-10-CM | POA: Diagnosis not present

## 2021-11-16 DIAGNOSIS — K5730 Diverticulosis of large intestine without perforation or abscess without bleeding: Secondary | ICD-10-CM | POA: Diagnosis not present

## 2021-11-16 DIAGNOSIS — C210 Malignant neoplasm of anus, unspecified: Secondary | ICD-10-CM

## 2021-11-16 DIAGNOSIS — N895 Stricture and atresia of vagina: Secondary | ICD-10-CM | POA: Insufficient documentation

## 2021-11-16 DIAGNOSIS — I10 Essential (primary) hypertension: Secondary | ICD-10-CM | POA: Diagnosis not present

## 2021-11-16 DIAGNOSIS — Z85048 Personal history of other malignant neoplasm of rectum, rectosigmoid junction, and anus: Secondary | ICD-10-CM | POA: Insufficient documentation

## 2021-11-16 DIAGNOSIS — E119 Type 2 diabetes mellitus without complications: Secondary | ICD-10-CM | POA: Insufficient documentation

## 2021-11-16 LAB — CMP (CANCER CENTER ONLY)
ALT: 56 U/L — ABNORMAL HIGH (ref 0–44)
AST: 36 U/L (ref 15–41)
Albumin: 4.3 g/dL (ref 3.5–5.0)
Alkaline Phosphatase: 94 U/L (ref 38–126)
Anion gap: 8 (ref 5–15)
BUN: 18 mg/dL (ref 8–23)
CO2: 26 mmol/L (ref 22–32)
Calcium: 9.7 mg/dL (ref 8.9–10.3)
Chloride: 107 mmol/L (ref 98–111)
Creatinine: 0.75 mg/dL (ref 0.44–1.00)
GFR, Estimated: >60 mL/min (ref >60)
Glucose, Bld: 162 mg/dL — ABNORMAL HIGH (ref 70–99)
Potassium: 4.1 mmol/L (ref 3.5–5.1)
Sodium: 141 mmol/L (ref 135–145)
Total Bilirubin: 0.4 mg/dL (ref 0.3–1.2)
Total Protein: 7.3 g/dL (ref 6.5–8.1)

## 2021-11-16 LAB — CBC WITH DIFFERENTIAL (CANCER CENTER ONLY)
Abs Immature Granulocytes: 0.02 10*3/uL (ref 0.00–0.07)
Basophils Absolute: 0 10*3/uL (ref 0.0–0.1)
Basophils Relative: 1 %
Eosinophils Absolute: 0.1 10*3/uL (ref 0.0–0.5)
Eosinophils Relative: 2 %
HCT: 38.7 % (ref 36.0–46.0)
Hemoglobin: 13.2 g/dL (ref 12.0–15.0)
Immature Granulocytes: 0 %
Lymphocytes Relative: 16 %
Lymphs Abs: 1 10*3/uL (ref 0.7–4.0)
MCH: 33.6 pg (ref 26.0–34.0)
MCHC: 34.1 g/dL (ref 30.0–36.0)
MCV: 98.5 fL (ref 80.0–100.0)
Monocytes Absolute: 0.6 10*3/uL (ref 0.1–1.0)
Monocytes Relative: 10 %
Neutro Abs: 4.1 10*3/uL (ref 1.7–7.7)
Neutrophils Relative %: 71 %
Platelet Count: 248 10*3/uL (ref 150–400)
RBC: 3.93 MIL/uL (ref 3.87–5.11)
RDW: 12.8 % (ref 11.5–15.5)
WBC Count: 5.8 10*3/uL (ref 4.0–10.5)
nRBC: 0 % (ref 0.0–0.2)

## 2021-11-16 NOTE — Progress Notes (Signed)
[name]   [phone] Fax:(336) [phone]   Clinic Follow up Note   Patient Care Team: Angelica Frees, MD as PCP - General (Family Medicine) Angelica Ruff, MD as Consulting Physician (General Surgery) Angelica Dom, MD as Consulting Physician (Obstetrics and Gynecology) Angelica Merle, MD as Consulting Physician (Hematology) Angelica Man, MD as Consulting Physician (Cardiology)  Date of Service:  11/16/2021  CHIEF COMPLAINT: f/u of anal cancer  CURRENT THERAPY:  Surveillance  ASSESSMENT & PLAN:  Angelica Wright is a 66 y.o. female with   1. Anal Cancer, with positive left pelvic lymph node, cT2N1bM0, stage IIIA -She was diagnosed in 09/2018, and completed concurrent chemoRT on 12/25/2018. She has recovered well.  -Her 03/2018 genetic testing was negative except for VUS of gene PMS2. -Based on 04/14/19 PET she had complete radiographic response -most recent CT A/P from 05/01/21 showed NED. -She is clinically doing well. Physical exam unremarkable except mild external hemorrhoids. Her last scope with Dr. Marcello Wright in 07/2021 was also normal. -F/u in 5 months, will plan for CT several days before. She will see Dr. Marcello Wright in 01/2022.   2. HTN and DM -Continue medication, not on diabetic medication. She is being managed by her PCP    3. 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  -she continues to do pelvic floor exercises at home -she was prescribed estradiol for her vaginal stenosis, which she notes she is trying to wean off of.   4. Cancer Screenings, Osteoporosis -Her 10/2019 DEXA showed osteoporosis with lowest T-score -3 at left femur neck. Repeat scheduled for 12/05/21. -Most recent Pap 12/2020, negative -Most recent mammogram 04/2021, negative     PLAN:  -she is clinically doing well -f/u with  Dr. Marcello Wright in 01/2022 -f/u in 5 months, with lab and CT several days before   No problem-specific Assessment & Plan notes found for this encounter.   SUMMARY OF ONCOLOGIC HISTORY: Oncology History  Anal cancer ([place])  03/19/2018 Genetic Testing   Genetic testing performed through [facility]'s Common Hereditary Cancers Panel reported out on 03/10/2018 showed no pathogenic mutations. The Common Hereditary Cancer Panel offered by [facility] includes sequencing and/or deletion duplication testing of the following 47 genes: APC, ATM, AXIN2, BARD1, BMPR1A, BRCA1, BRCA2, BRIP1, CDH1, CDKN2A (p14ARF), CDKN2A (p16INK4a), CKD4, CHEK2, CTNNA1, DICER1, EPCAM (Deletion/duplication testing only), GREM1 (promoter region deletion/duplication testing only), KIT, MEN1, MLH1, MSH2, MSH3, MSH6, MUTYH, NBN, NF1, NHTL1, PALB2, PDGFRA, PMS2, POLD1, POLE, PTEN, RAD50, RAD51C, RAD51D, SDHB, SDHC, SDHD, SMAD4, SMARCA4. STK11, TP53, TSC1, TSC2, and VHL.  The following genes were evaluated for sequence changes only: SDHA and HOXB13 c.251G>A variant only.   A variant of uncertain significance (VUS) in a gene called PMS2 was also noted. c.1399G>A (p.Val467Ile)   08/14/2018 Imaging   MRI PELVIS W WO Contrast 08/14/18 IMPRESSION: 1. Centrally necrotic 1.8 cm enlarged lymph node versus nonspecific soft tissue mass in the left hemipelvis adjacent to the posterior pelvic sidewall. Further evaluation with biopsy and/or PET-CT is recommended.   09/17/2018 Pathology Results   Diagnosis 09/17/18 Lymph node, needle/core biopsy, left pelvic - SQUAMOUS CELL CARCINOMA. - SEE COMMENT. Microscopic Comment   10/09/2018 PET scan   PET 10/09/18  IMPRESSION: 1. 17 mm left pelvic sidewall lesion is markedly hypermetabolic compatible with known malignancy. 2. No additional definite hypermetabolic disease identified in the neck, chest, abdomen,  or pelvis. 3. Relatively diffuse FDG accumulation in the colon without focal increased uptake above  background colonic levels in the region of the anus. 4. Focal FDG accumulation identified along the anterior left femoral neck and in the lumbosacral junction. No underlying bone lesions evident at these locations on CT images. Indeterminate finding although metastatic disease cannot be excluded.    10/13/2018 Initial Biopsy   Diagnosis 10/13/18 Anus, biopsy - SQUAMOUS CELL CARCINOMA. - SEE COMMENT.   10/14/2018 Initial Diagnosis   Anal cancer ([place])   10/14/2018 Cancer Staging   Staging form: Anus, AJCC 8th Edition - Clinical stage from 10/14/2018: Stage IIIA (cT2, cN1b, cM0) - Signed by Angelica Merle, MD on 10/14/2018    11/03/2018 - 12/15/2018 Radiation Therapy   Concurrent chemoRt with Dr. Lisbeth Renshaw for 5-6 weeks starting 11/03/18. Plans to complete on 12/15/18   11/03/2018 - 12/15/2018 Chemotherapy   Concurrent chemoRt with Mitomycin and 5-FU on week 1 and week 5 starting 11/03/18. Week 5 chemo on 12/01/18   04/14/2019 PET scan   PET 04/14/19  IMPRESSION: 1. The hypermetabolic 17 mm left pelvic sidewall lesion seen on the previous study has resolved in the interval. No new or progressive hypermetabolic disease in the neck, chest, abdomen, or pelvis on today's study. 2. Hepatic steatosis. 3. Colonic diverticulosis without diverticulitis.     04/14/2020 Imaging   CT CAP w contrast IMPRESSION: 1. Thickening and stranding about the anus, see PET-CT for further detail. No mass lesion grossly on this evaluation. 2. No evidence of metastatic disease in the chest, abdomen or pelvis. 3. Marked hepatic steatosis. Signs of recanalization of the umbilical vein, correlate with any clinical signs of liver disease. This is indicative of portal hypertension but is seen in the absence of splenomegaly. Note that the portal vein is patent into the liver. 4. Recanalized umbilical vein with varix at the umbilicus. 5. Aortic atherosclerosis.   Aortic Atherosclerosis (ICD10-I70.0).   05/01/2021  Imaging   CT A/P w contrast  IMPRESSION: 1. No evidence of anal carcinoma recurrence or metastasis. 2. No abdominopelvic lymphadenopathy. 3. Scattered colonic diverticulosis without diverticulitis.      INTERVAL HISTORY:  Angelica Wright is here for a follow up of anal cancer. She was last seen by me on 05/04/21. She presents to the clinic accompanied by her husband. She reports no new issues. She reports she is still having bowel movements 3-4 times a day; she adds that she will sometimes not make it to the bathroom. She denies pain or bloating.   All other systems were reviewed with the patient and are negative.  MEDICAL HISTORY:  Past Medical History:  Diagnosis Date   Aortic atherosclerosis ([place])    Essential hypertension    Hyperlipidemia associated with type 2 diabetes mellitus ([place])    Hyperparathyroidism ([place])    resolved after surgery   Nonalcoholic steatohepatitis ([facility])    Osteoarthritis    Hands, feet, knees   Osteoporosis    of the spine   Rectocele    Sensorineural hearing loss (SNHL) of both ears    Squamous cell cancer, anus ([place])    (Dr. Ermelinda Das -oncology) -radiation and chemo therapy   TMJ syndrome    Type 2 Diabetes ([place]) 11/2013   controlled with diet/exercise    SURGICAL HISTORY: Past Surgical History:  Procedure Laterality Date   LYMPH NODE BIOPSY     PARATHYROIDECTOMY  2000   THERAPEUTIC ABORTION     x 2   VULVA /PERINEUM BIOPSY  2012    I have reviewed the social history and family history with the patient and they are unchanged from previous note.  ALLERGIES:  is allergic to polymyxin b-trimethoprim and sulfa antibiotics.  MEDICATIONS:  Current Outpatient Medications  Medication Sig Dispense Refill   alendronate (FOSAMAX) 70 MG tablet Take 1 tablet (70 mg total) by mouth every 7 (seven) days. Take first thing in am with 6 oz. Water.  Be upright after taking.  Eat nothing for one hour. 12 tablet 3   aspirin 81 MG chewable tablet Chew 81 mg  by mouth daily.     atenolol (TENORMIN) 100 MG tablet Take 100 mg by mouth daily.     atorvastatin (LIPITOR) 20 MG tablet Take 1 tablet by mouth daily.     Blood Glucose Monitoring Suppl (ACCU-CHEK GUIDE) w/Device KIT daily.     Cholecalciferol (VITAMIN D3) 125 MCG (5000 UT) CAPS Take 5,000 Units by mouth daily.     clobetasol ointment (TEMOVATE) 0.05 % Apply a pea sized amount topically 1-2 x a week and  BID x 1-2 weeks prn flare 30 g 1   Estradiol (VAGIFEM) 10 MCG TABS vaginal tablet PLACE ONE TABLET VAGINALLY 2 X A WEEK 24 tablet 0   hydrochlorothiazide (HYDRODIURIL) 12.5 MG tablet Take 12.5 mg by mouth daily.      Lancets (ACCU-CHEK MULTICLIX) lancets See admin instructions.     nystatin cream (MYCOSTATIN) Apply 1 application topically 2 (two) times daily. Apply to affected area BID for up to 7 days. 30 g 1   silver sulfADIAZINE (SILVADENE) 1 % cream Apply 1 application topically daily. 50 g 0   valACYclovir (VALTREX) 500 MG tablet Take one tab po BID for 3 days with flares. 30 tablet 1   vitamin C (ASCORBIC ACID) 500 MG tablet Take 500 mg by mouth daily.     No current facility-administered medications for this visit.    PHYSICAL EXAMINATION: ECOG PERFORMANCE STATUS: 0 - Asymptomatic  Vitals:   11/16/21 0810  BP: 137/84  Pulse: 72  Resp: 17  Temp: 97.9 F (36.6 C)  SpO2: 99%   Wt Readings from Last 3 Encounters:  11/16/21 168 lb 12.8 oz (76.6 kg)  07/12/21 168 lb (76.2 kg)  05/04/21 166 lb 12.8 oz (75.7 kg)     GENERAL:alert, no distress and comfortable SKIN: skin color, texture, turgor are normal, no rashes or significant lesions EYES: normal, Conjunctiva are pink and non-injected, sclera clear  NECK: supple, thyroid normal size, non-tender, without nodularity LYMPH:  no palpable lymphadenopathy in the cervical, axillary  LUNGS: clear to auscultation and percussion with normal breathing effort HEART: regular rate & rhythm and no murmurs and no lower extremity  edema ABDOMEN:abdomen soft, non-tender and normal bowel sounds Musculoskeletal:no cyanosis of digits and no clubbing  NEURO: alert & oriented x 3 with fluent speech, no focal motor/sensory deficits RECTAL: No palpable mass. No blood on glove. Normal stool present. Benign exam    LABORATORY DATA:  I have reviewed the data as listed CBC Latest Ref Rng & Units 11/16/2021 04/18/2021 10/18/2020  WBC 4.0 - 10.5 K/uL 5.8 5.7 5.7  Hemoglobin 12.0 - 15.0 g/dL 13.2 13.3 13.1  Hematocrit 36.0 - 46.0 % 38.7 38.6 39.0  Platelets 150 - 400 K/uL 248 253 260     CMP Latest Ref Rng & Units 11/16/2021 04/18/2021 10/18/2020  Glucose 70 - 99 mg/dL 162(H) 148(H) 127(H)  BUN 8 - 23 mg/dL '18 20 15  ' Creatinine 0.44 - 1.00  mg/dL 0.75 0.77 0.79  Sodium 135 - 145 mmol/L 141 142 141  Potassium 3.5 - 5.1 mmol/L 4.1 3.9 4.5  Chloride 98 - 111 mmol/L 107 109 109  CO2 22 - 32 mmol/L '26 25 25  ' Calcium 8.9 - 10.3 mg/dL 9.7 9.8 10.0  Total Protein 6.5 - 8.1 g/dL 7.3 7.6 7.7  Total Bilirubin 0.3 - 1.2 mg/dL 0.4 0.5 0.7  Alkaline Phos 38 - 126 U/L 94 109 95  AST 15 - 41 U/L 36 40 29  ALT 0 - 44 U/L 56(H) 63(H) 43      RADIOGRAPHIC STUDIES: I have personally reviewed the radiological images as listed and agreed with the findings in the report. No results found.    Orders Placed This Encounter  Procedures   CT CHEST ABDOMEN PELVIS W CONTRAST    Standing Status:   Future    Standing Expiration Date:   11/16/2022    Order Specific Question:   Preferred imaging location?    Answer:   [facility]    Order Specific Question:   Is Oral Contrast requested for this exam?    Answer:   Yes, Per Radiology protocol   All questions were answered. The patient knows to call the clinic with any problems, questions or concerns. No barriers to learning was detected. The total time spent in the appointment was 30 minutes.     Angelica Merle, MD 11/16/2021   I, Wilburn Mylar, am acting as scribe for Angelica Merle, MD.    I have reviewed the above documentation for accuracy and completeness, and I agree with the above.

## 2021-12-18 NOTE — Progress Notes (Signed)
66 y.o. [id number] Married Other or two or more races Not Hispanic or Latino female here for annual exam.    Patient states that she is having some vaginal discharge. The d/c is yellow, mild odor   H/O anal cancer, treated with radiation in 2020. She has resultant severe vaginal atrophy, was treated with vaginal estrogen and vaginal dilators. She is doing the vaginal estrogen 1 x a week, feeling dry will go back to 2 x a week. The dilators didn't help, so she stopped using them. Sexually active without penetration.   H/O lichen sclerosis, bothers her on occasion and will use a steroid ointment. Will call when she needs more.   FH of breast and uterine cancer, genetic testing negative, she did have a VUS in PMS2 TC risk under 20%  H/O osteoporosis, previously treated with fosamax for a few weeks. She didn't tolerate it.  11/16/21 labs: CMP with glucose of 162, ALT 56, otherwise normal CBC normal  Patient's last menstrual period was 11/05/2002 (approximate).          Sexually active: No.  The current method of family planning is post menopausal status.    Exercising: No.  The patient does not participate in regular exercise at present. Smoker:  no  Health Maintenance: Pap:  12/22/20 WNL, neg hpv; 01/16/2018 WNL NEG HPV,  04-28-14 WNL   History of abnormal Pap:  no MMG:  04/18/21 density B Bi-rads 1 neg  BMD:  12/19/21 osteoporosis T score -2.8; 10/09/19 Osteoporosis T-score -3   Colonoscopy: never  TDaP:  12/22/20  Gardasil: n/a   reports that she has never smoked. She has never used smokeless tobacco. She reports current alcohol use of about 7.0 standard drinks per week. She reports that she does not use drugs. 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 2 grown sons, 5 grand children, all local  Past Medical History:  Diagnosis Date   Aortic atherosclerosis ([place])    Essential hypertension    Hyperlipidemia associated with type 2 diabetes mellitus ([place])    Hyperparathyroidism ([place])     resolved after surgery   Nonalcoholic steatohepatitis ([facility])    Osteoarthritis    Hands, feet, knees   Osteoporosis    of the spine   Rectocele    Sensorineural hearing loss (SNHL) of both ears    Squamous cell cancer, anus ([place])    (Dr. Ermelinda Das -oncology) -radiation and chemo therapy   TMJ syndrome    Type 2 Diabetes ([place]) 11/2013   controlled with diet/exercise    Past Surgical History:  Procedure Laterality Date   LYMPH NODE BIOPSY     PARATHYROIDECTOMY  2000   THERAPEUTIC ABORTION     x 2   VULVA /PERINEUM BIOPSY  2012    Current Outpatient Medications  Medication Sig Dispense Refill   aspirin 81 MG chewable tablet Chew 81 mg by mouth daily.     atenolol (TENORMIN) 100 MG tablet Take 100 mg by mouth daily.     atorvastatin (LIPITOR) 20 MG tablet Take 1 tablet by mouth daily.     Blood Glucose Monitoring Suppl (ACCU-CHEK GUIDE) w/Device KIT daily.     Cholecalciferol (VITAMIN D3) 125 MCG (5000 UT) CAPS Take 5,000 Units by mouth daily.     clobetasol ointment (TEMOVATE) 0.05 % Apply a pea sized amount topically 1-2 x a week and  BID x 1-2 weeks prn flare 30 g 1   Estradiol (VAGIFEM) 10 MCG TABS vaginal tablet PLACE ONE TABLET  VAGINALLY 2 X A WEEK 24 tablet 0   hydrochlorothiazide (HYDRODIURIL) 12.5 MG tablet Take 12.5 mg by mouth daily.      Lancets (ACCU-CHEK MULTICLIX) lancets See admin instructions.     nystatin cream (MYCOSTATIN) Apply 1 application topically 2 (two) times daily. Apply to affected area BID for up to 7 days. 30 g 1   valACYclovir (VALTREX) 500 MG tablet Take one tab po BID for 3 days with flares. 30 tablet 1   vitamin C (ASCORBIC ACID) 500 MG tablet Take 500 mg by mouth daily.     Magnesium 400 MG TABS See admin instructions.     No current facility-administered medications for this visit.    Family History  Problem Relation Age of Onset   Breast cancer Mother 73       lumpectomy, x2 in other breast in late 80's   Endometrial cancer Mother 4    Breast cancer Maternal Aunt        dx in early 63's, x2 in late 80's   Breast cancer Paternal Aunt 29   Breast cancer Paternal Aunt 83   Cirrhosis Father        ETOH related   Liver cancer Father    Lung cancer Father        tobacco user   Liver disease Father    Stroke Maternal Grandmother    Stroke Maternal Grandfather    Emphysema Paternal Grandmother    Diabetes Paternal Grandfather    Breast cancer Cousin 76    Review of Systems  Genitourinary:  Positive for vaginal discharge.  All other systems reviewed and are negative.  Exam:   BP 134/82    Pulse 78    Ht '5\' 1"'  (1.549 m)    Wt 168 lb (76.2 kg)    LMP 11/05/2002 (Approximate)    SpO2 99%    BMI 31.74 kg/m   Weight change: '@WEIGHTCHANGE' @ Height:   Height: '5\' 1"'  (154.9 cm)  Ht Readings from Last 3 Encounters:  12/27/21 '5\' 1"'  (1.549 m)  07/12/21 '5\' 1"'  (1.549 m)  05/04/21 '5\' 1"'  (1.549 m)    General appearance: alert, cooperative and appears stated age Head: Normocephalic, without obvious abnormality, atraumatic Neck: no adenopathy, supple, symmetrical, trachea midline and thyroid normal to inspection and palpation Lungs: clear to auscultation bilaterally Cardiovascular: regular rate and rhythm Breasts: normal appearance, no masses or tenderness Abdomen: soft, non-tender; non distended,  no masses,  no organomegaly Extremities: extremities normal, atraumatic, no cyanosis or edema Skin: Skin color, texture, turgor normal. No rashes or lesions Lymph nodes: Cervical, supraclavicular, and axillary nodes normal. No abnormal inguinal nodes palpated Neurologic: Grossly normal   Pelvic: External genitalia:  no lesions, some whitening on the inner right labia majora, some agglutination of right labia majora to minora, some loss of architecture of the left labia minora.              Urethra:  normal appearing urethra with no masses, tenderness or lesions              Bartholins and Skenes: normal                 Vagina:  mildly  atrophic appearing vagina with normal color and discharge, no lesions. Easily able to pass an adolescent speculum and one finger. Small grade 1-2 rectocele              Cervix: no lesions and flush with vagina  Bimanual Exam:  Uterus:   no masses or tenderness              Adnexa: no mass, fullness, tenderness               Rectovaginal: Confirms               Anus:  normal sphincter tone, no lesions  Gae Dry chaperoned for the exam.      1. Well woman exam Mammogram in 6/23 No pap this year  2. Colon cancer screening - Ambulatory referral to Gastroenterology  3. History of anal cancer Being followed by Oncology  4. History of radiation therapy  5. Lichen sclerosus Stable, she will call when she needs more steroid  6. Vaginal atrophy Helped with vaginal estrogen, increase estrogen to 2 x a week  7. Osteoporosis without current pathological fracture, unspecified osteoporosis type Discussed treatment options. She didn't tolerate fosamax. Will get a copy of lab work from primary Discussed prolia and IV bisphosphonate therapy, reading information given. Risks discussed. Once I have her lab work back will start treatment. She is leaning towards prolia. We discussed that if she starts prolia, she will need to stay on it. Reviewed the rapid decrease in vertebral bone density after stopping prolia.   8. Vitamin D deficiency Will see if her primary checked vit d, if not will order it  9. Vaginal discharge - WET PREP FOR TRICH, YEAST, CLUE: negative - SureSwab Advanced Vaginitis, TMA

## 2021-12-19 ENCOUNTER — Other Ambulatory Visit: Payer: Self-pay | Admitting: Obstetrics and Gynecology

## 2021-12-19 ENCOUNTER — Ambulatory Visit (INDEPENDENT_AMBULATORY_CARE_PROVIDER_SITE_OTHER): Payer: Managed Care, Other (non HMO)

## 2021-12-19 ENCOUNTER — Other Ambulatory Visit: Payer: Self-pay

## 2021-12-19 DIAGNOSIS — Z780 Asymptomatic menopausal state: Secondary | ICD-10-CM

## 2021-12-19 DIAGNOSIS — M810 Age-related osteoporosis without current pathological fracture: Secondary | ICD-10-CM | POA: Diagnosis not present

## 2021-12-27 ENCOUNTER — Encounter: Payer: Self-pay | Admitting: Obstetrics and Gynecology

## 2021-12-27 ENCOUNTER — Ambulatory Visit (INDEPENDENT_AMBULATORY_CARE_PROVIDER_SITE_OTHER): Payer: Managed Care, Other (non HMO) | Admitting: Obstetrics and Gynecology

## 2021-12-27 ENCOUNTER — Other Ambulatory Visit: Payer: Self-pay

## 2021-12-27 VITALS — BP 134/82 | HR 78 | Ht 61.0 in | Wt 168.0 lb

## 2021-12-27 DIAGNOSIS — Z923 Personal history of irradiation: Secondary | ICD-10-CM | POA: Diagnosis not present

## 2021-12-27 DIAGNOSIS — Z01419 Encounter for gynecological examination (general) (routine) without abnormal findings: Secondary | ICD-10-CM | POA: Diagnosis not present

## 2021-12-27 DIAGNOSIS — R143 Flatulence: Secondary | ICD-10-CM | POA: Insufficient documentation

## 2021-12-27 DIAGNOSIS — L900 Lichen sclerosus et atrophicus: Secondary | ICD-10-CM

## 2021-12-27 DIAGNOSIS — Z1211 Encounter for screening for malignant neoplasm of colon: Secondary | ICD-10-CM | POA: Diagnosis not present

## 2021-12-27 DIAGNOSIS — Z85048 Personal history of other malignant neoplasm of rectum, rectosigmoid junction, and anus: Secondary | ICD-10-CM | POA: Diagnosis not present

## 2021-12-27 DIAGNOSIS — E559 Vitamin D deficiency, unspecified: Secondary | ICD-10-CM

## 2021-12-27 DIAGNOSIS — R142 Eructation: Secondary | ICD-10-CM | POA: Insufficient documentation

## 2021-12-27 DIAGNOSIS — E1169 Type 2 diabetes mellitus with other specified complication: Secondary | ICD-10-CM | POA: Insufficient documentation

## 2021-12-27 DIAGNOSIS — N952 Postmenopausal atrophic vaginitis: Secondary | ICD-10-CM

## 2021-12-27 DIAGNOSIS — H903 Sensorineural hearing loss, bilateral: Secondary | ICD-10-CM | POA: Insufficient documentation

## 2021-12-27 DIAGNOSIS — K219 Gastro-esophageal reflux disease without esophagitis: Secondary | ICD-10-CM | POA: Insufficient documentation

## 2021-12-27 DIAGNOSIS — M810 Age-related osteoporosis without current pathological fracture: Secondary | ICD-10-CM

## 2021-12-27 DIAGNOSIS — H9313 Tinnitus, bilateral: Secondary | ICD-10-CM | POA: Insufficient documentation

## 2021-12-27 DIAGNOSIS — E1165 Type 2 diabetes mellitus with hyperglycemia: Secondary | ICD-10-CM | POA: Insufficient documentation

## 2021-12-27 DIAGNOSIS — N898 Other specified noninflammatory disorders of vagina: Secondary | ICD-10-CM

## 2021-12-27 DIAGNOSIS — E669 Obesity, unspecified: Secondary | ICD-10-CM | POA: Insufficient documentation

## 2021-12-27 LAB — WET PREP FOR TRICH, YEAST, CLUE

## 2021-12-27 NOTE — Patient Instructions (Signed)
EXERCISE   We recommended that you start or continue a regular exercise program for good health. Physical activity is anything that gets your body moving, some is better than none. The CDC recommends 150 minutes per week of Moderate-Intensity Aerobic Activity and 2 or more days of Muscle Strengthening Activity.  Benefits of exercise are limitless: helps weight loss/weight maintenance, improves mood and energy, helps with depression and anxiety, improves sleep, tones and strengthens muscles, improves balance, improves bone density, protects from chronic conditions such as heart disease, high blood pressure and diabetes and so much more. To learn more visit: https://www.cdc.gov/physicalactivity/index.html  DIET: Good nutrition starts with a healthy diet of fruits, vegetables, whole grains, and lean protein sources. Drink plenty of water for hydration. Minimize empty calories, sodium, sweets. For more information about dietary recommendations visit: https://health.gov/our-work/nutrition-physical-activity/dietary-guidelines and https://www.myplate.gov/  ALCOHOL:  Women should limit their alcohol intake to no more than 7 drinks/beers/glasses of wine (combined, not each!) per week. Moderation of alcohol intake to this level decreases your risk of breast cancer and liver damage.  If you are concerned that you may have a problem, or your friends have told you they are concerned about your drinking, there are many resources to help. A well-known program that is free, effective, and available to all people all over the nation is Alcoholics Anonymous.  Check out this site to learn more: https://www.aa.org/   CALCIUM AND VITAMIN D:  Adequate intake of calcium and Vitamin D are recommended for bone health.  You should be getting between 1000-1200 mg of calcium and 800 units of Vitamin D daily between diet and supplements  PAP SMEARS:  Pap smears, to check for cervical cancer or precancers,  have traditionally been  done yearly, scientific advances have shown that most women can have pap smears less often.  However, every woman still should have a physical exam from her gynecologist every year. It will include a breast check, inspection of the vulva and vagina to check for abnormal growths or skin changes, a visual exam of the cervix, and then an exam to evaluate the size and shape of the uterus and ovaries. We will also provide age appropriate advice regarding health maintenance, like when you should have certain vaccines, screening for sexually transmitted diseases, bone density testing, colonoscopy, mammograms, etc.   MAMMOGRAMS:  All women over 40 years old should have a routine mammogram.   COLON CANCER SCREENING: Now recommend starting at age 45. At this time colonoscopy is not covered for routine screening until 50. There are take home tests that can be done between 45-49.   COLONOSCOPY:  Colonoscopy to screen for colon cancer is recommended for all women at age 50.  We know, you hate the idea of the prep.  We agree, BUT, having colon cancer and not knowing it is worse!!  Colon cancer so often starts as a polyp that can be seen and removed at colonscopy, which can quite literally save your life!  And if your first colonoscopy is normal and you have no family history of colon cancer, most women don't have to have it again for 10 years.  Once every ten years, you can do something that may end up saving your life, right?  We will be happy to help you get it scheduled when you are ready.  Be sure to check your insurance coverage so you understand how much it will cost.  It may be covered as a preventative service at no cost, but you should check   your particular policy.      Breast Self-Awareness Breast self-awareness means being familiar with how your breasts look and feel. It involves checking your breasts regularly and reporting any changes to your health care provider. Practicing breast self-awareness is  important. A change in your breasts can be a sign of a serious medical problem. Being familiar with how your breasts look and feel allows you to find any problems early, when treatment is more likely to be successful. All women should practice breast self-awareness, including women who have had breast implants. How to do a breast self-exam One way to learn what is normal for your breasts and whether your breasts are changing is to do a breast self-exam. To do a breast self-exam: Look for Changes  Remove all the clothing above your waist. Stand in front of a mirror in a room with good lighting. Put your hands on your hips. Push your hands firmly downward. Compare your breasts in the mirror. Look for differences between them (asymmetry), such as: Differences in shape. Differences in size. Puckers, dips, and bumps in one breast and not the other. Look at each breast for changes in your skin, such as: Redness. Scaly areas. Look for changes in your nipples, such as: Discharge. Bleeding. Dimpling. Redness. A change in position. Feel for Changes Carefully feel your breasts for lumps and changes. It is best to do this while lying on your back on the floor and again while sitting or standing in the shower or tub with soapy water on your skin. Feel each breast in the following way: Place the arm on the side of the breast you are examining above your head. Feel your breast with the other hand. Start in the nipple area and make  inch (2 cm) overlapping circles to feel your breast. Use the pads of your three middle fingers to do this. Apply light pressure, then medium pressure, then firm pressure. The light pressure will allow you to feel the tissue closest to the skin. The medium pressure will allow you to feel the tissue that is a little deeper. The firm pressure will allow you to feel the tissue close to the ribs. Continue the overlapping circles, moving downward over the breast until you feel your  ribs below your breast. Move one finger-width toward the center of the body. Continue to use the  inch (2 cm) overlapping circles to feel your breast as you move slowly up toward your collarbone. Continue the up and down exam using all three pressures until you reach your armpit.  Write Down What You Find  Write down what is normal for each breast and any changes that you find. Keep a written record with breast changes or normal findings for each breast. By writing this information down, you do not need to depend only on memory for size, tenderness, or location. Write down where you are in your menstrual cycle, if you are still menstruating. If you are having trouble noticing differences in your breasts, do not get discouraged. With time you will become more familiar with the variations in your breasts and more comfortable with the exam. How often should I examine my breasts? Examine your breasts every month. If you are breastfeeding, the best time to examine your breasts is after a feeding or after using a breast pump. If you menstruate, the best time to examine your breasts is 5-7 days after your period is over. During your period, your breasts are lumpier, and it may be more   difficult to notice changes. When should I see my health care provider? See your health care provider if you notice: A change in shape or size of your breasts or nipples. A change in the skin of your breast or nipples, such as a reddened or scaly area. Unusual discharge from your nipples. A lump or thick area that was not there before. Pain in your breasts. Anything that concerns you. Osteoporosis Osteoporosis happens when the bones become thin and less dense than normal. Osteoporosis makes bones more brittle and fragile and more likely to break (fracture). Over time, osteoporosis can cause your bones to become so weak that they fracture after a minor fall. Bones in the hip, wrist, and spine are most likely to fracture  due to osteoporosis. What are the causes? The exact cause of this condition is not known. What increases the risk? You are more likely to develop this condition if you: Have family members with this condition. Have poor nutrition. Use the following: Steroid medicines, such as prednisone. Anti-seizure medicines. Nicotine or tobacco, such as cigarettes, e-cigarettes, and chewing tobacco. Are female. Are age 50 or older. Are not physically active (are sedentary). Are of European or Asian descent. Have a small body frame. What are the signs or symptoms? A fracture might be the first sign of osteoporosis, especially if the fracture results from a fall or injury that usually would not cause a bone to break. Other signs and symptoms include: Pain in the neck or low back. Stooped posture. Loss of height. How is this diagnosed? This condition may be diagnosed based on: Your medical history. A physical exam. A bone mineral density test, also called a DXA or DEXA test (dual-energy X-ray absorptiometry test). This test uses X-rays to measure the amount of minerals in your bones. How is this treated? This condition may be treated by: Making lifestyle changes, such as: Including foods with more calcium and vitamin D in your diet. Doing weight-bearing and muscle-strengthening exercises. Stopping tobacco use. Limiting alcohol intake. Taking medicine to slow the process of bone loss or to increase bone density. Taking daily supplements of calcium and vitamin D. Taking hormone replacement medicines, such as estrogen for women and testosterone for men. Monitoring your levels of calcium and vitamin D. The goal of treatment is to strengthen your bones and lower your risk for a fracture. Follow these instructions at home: Eating and drinking Include calcium and vitamin D in your diet. Calcium is important for bone health, and vitamin D helps your body absorb calcium. Good sources of calcium and  vitamin D include: Certain fatty fish, such as salmon and tuna. Products that have calcium and vitamin D added to them (are fortified), such as fortified cereals. Egg yolks. Cheese. Liver.  Activity Do exercises as told by your health care provider. Ask your health care provider what exercises and activities are safe for you. You should do: Exercises that make you work against gravity (weight-bearing exercises), such as tai chi, yoga, or walking. Exercises to strengthen muscles, such as lifting weights. Lifestyle Do not drink alcohol if: Your health care provider tells you not to drink. You are pregnant, may be pregnant, or are planning to become pregnant. If you drink alcohol: Limit how much you use to: 0-1 drink a day for women. 0-2 drinks a day for men. Know how much alcohol is in your drink. In the [place] one drink equals one 12 oz bottle of beer (355 mL), one 5 oz glass of wine (148   mL), or one 1 oz glass of hard liquor (44 mL). Do not use any products that contain nicotine or tobacco, such as cigarettes, e-cigarettes, and chewing tobacco. If you need help quitting, ask your health care provider. Preventing falls Use devices to help you move around (mobility aids) as needed, such as canes, walkers, scooters, or crutches. Keep rooms well-lit and clutter-free. Remove tripping hazards from walkways, including cords and throw rugs. Install grab bars in bathrooms and safety rails on stairs. Use rubber mats in the bathroom and other areas that are often wet or slippery. Wear closed-toe shoes that fit well and support your feet. Wear shoes that have rubber soles or low heels. Review your medicines with your health care provider. Some medicines can cause dizziness or changes in blood pressure, which can increase your risk of falling. General instructions Take over-the-counter and prescription medicines only as told by your health care provider. Keep all follow-up visits. This is  important. Contact a health care provider if: You have never been screened for osteoporosis and you are: A woman who is age 65 or older. A man who is age 70 or older. Get help right away if: You fall or injure yourself. Summary Osteoporosis is thinning and loss of density in your bones. This makes bones more brittle and fragile and more likely to break (fracture),even with minor falls. The goal of treatment is to strengthen your bones and lower your risk for a fracture. Include calcium and vitamin D in your diet. Calcium is important for bone health, and vitamin D helps your body absorb calcium. Talk with your health care provider about screening for osteoporosis if you are a woman who is age 65 or older, or a man who is age 70 or older. This information is not intended to replace advice given to you by your health care provider. Make sure you discuss any questions you have with your health care provider. Document Revised: 04/07/2020 Document Reviewed: 04/07/2020 [employer] Patient Education  2022 [employer]  

## 2021-12-28 LAB — SURESWAB® ADVANCED VAGINITIS,TMA
CANDIDA SPECIES: NOT DETECTED
Candida glabrata: NOT DETECTED
SURESWAB(R) ADV BACTERIAL VAGINOSIS(BV),TMA: NEGATIVE
TRICHOMONAS VAGINALIS (TV),TMA: NOT DETECTED

## 2022-01-16 ENCOUNTER — Other Ambulatory Visit: Payer: Self-pay | Admitting: Obstetrics and Gynecology

## 2022-01-16 NOTE — Telephone Encounter (Signed)
JJ pt.  ?AEX UTD.  ?Mammo due 04/2022.  ?

## 2022-02-10 ENCOUNTER — Other Ambulatory Visit: Payer: Self-pay | Admitting: Obstetrics and Gynecology

## 2022-03-10 ENCOUNTER — Other Ambulatory Visit: Payer: Self-pay | Admitting: Obstetrics and Gynecology

## 2022-03-10 DIAGNOSIS — N952 Postmenopausal atrophic vaginitis: Secondary | ICD-10-CM

## 2022-03-12 NOTE — Telephone Encounter (Signed)
Last AEX 12/27/21 ?Last mammo 04/18/21.  ?

## 2022-03-27 ENCOUNTER — Telehealth: Payer: Self-pay | Admitting: Hematology

## 2022-03-27 NOTE — Telephone Encounter (Signed)
.  Called patient to schedule appointment per 5/23 inbasket, patient is aware of date and time.   

## 2022-04-16 ENCOUNTER — Other Ambulatory Visit: Payer: Managed Care, Other (non HMO)

## 2022-04-19 ENCOUNTER — Ambulatory Visit: Payer: Managed Care, Other (non HMO) | Admitting: Hematology

## 2022-04-19 ENCOUNTER — Encounter: Payer: Self-pay | Admitting: Hematology

## 2022-04-24 ENCOUNTER — Encounter: Payer: Self-pay | Admitting: Hematology

## 2022-04-26 ENCOUNTER — Other Ambulatory Visit: Payer: Self-pay

## 2022-04-26 ENCOUNTER — Encounter: Payer: Self-pay | Admitting: Hematology

## 2022-04-26 ENCOUNTER — Encounter (HOSPITAL_COMMUNITY): Payer: Self-pay

## 2022-04-26 ENCOUNTER — Inpatient Hospital Stay: Payer: Medicare Other | Attending: Hematology

## 2022-04-26 ENCOUNTER — Ambulatory Visit (HOSPITAL_COMMUNITY)
Admission: RE | Admit: 2022-04-26 | Discharge: 2022-04-26 | Disposition: A | Discharge status: Home / Self Care | Payer: Medicare Other | Source: Ambulatory Visit | Attending: Hematology | Admitting: Hematology

## 2022-04-26 DIAGNOSIS — E119 Type 2 diabetes mellitus without complications: Secondary | ICD-10-CM | POA: Insufficient documentation

## 2022-04-26 DIAGNOSIS — M810 Age-related osteoporosis without current pathological fracture: Secondary | ICD-10-CM | POA: Diagnosis not present

## 2022-04-26 DIAGNOSIS — N895 Stricture and atresia of vagina: Secondary | ICD-10-CM | POA: Diagnosis not present

## 2022-04-26 DIAGNOSIS — K5730 Diverticulosis of large intestine without perforation or abscess without bleeding: Secondary | ICD-10-CM | POA: Diagnosis not present

## 2022-04-26 DIAGNOSIS — C210 Malignant neoplasm of anus, unspecified: Secondary | ICD-10-CM

## 2022-04-26 DIAGNOSIS — I10 Essential (primary) hypertension: Secondary | ICD-10-CM | POA: Diagnosis not present

## 2022-04-26 DIAGNOSIS — Z08 Encounter for follow-up examination after completed treatment for malignant neoplasm: Secondary | ICD-10-CM | POA: Diagnosis not present

## 2022-04-26 DIAGNOSIS — Z85048 Personal history of other malignant neoplasm of rectum, rectosigmoid junction, and anus: Secondary | ICD-10-CM | POA: Insufficient documentation

## 2022-04-26 LAB — CMP (CANCER CENTER ONLY)
ALT: 58 U/L — ABNORMAL HIGH (ref 0–44)
AST: 38 U/L (ref 15–41)
Albumin: 4.6 g/dL (ref 3.5–5.0)
Alkaline Phosphatase: 89 U/L (ref 38–126)
Anion gap: 9 (ref 5–15)
BUN: 15 mg/dL (ref 8–23)
CO2: 27 mmol/L (ref 22–32)
Calcium: 10.1 mg/dL (ref 8.9–10.3)
Chloride: 106 mmol/L (ref 98–111)
Creatinine: 0.78 mg/dL (ref 0.44–1.00)
GFR, Estimated: >60 mL/min (ref >60)
Glucose, Bld: 187 mg/dL — ABNORMAL HIGH (ref 70–99)
Potassium: 4.3 mmol/L (ref 3.5–5.1)
Sodium: 142 mmol/L (ref 135–145)
Total Bilirubin: 0.6 mg/dL (ref 0.3–1.2)
Total Protein: 7.7 g/dL (ref 6.5–8.1)

## 2022-04-26 LAB — CBC WITH DIFFERENTIAL (CANCER CENTER ONLY)
Abs Immature Granulocytes: 0.02 10*3/uL (ref 0.00–0.07)
Basophils Absolute: 0 10*3/uL (ref 0.0–0.1)
Basophils Relative: 0 %
Eosinophils Absolute: 0.1 10*3/uL (ref 0.0–0.5)
Eosinophils Relative: 1 %
HCT: 38.9 % (ref 36.0–46.0)
Hemoglobin: 13.7 g/dL (ref 12.0–15.0)
Immature Granulocytes: 0 %
Lymphocytes Relative: 12 %
Lymphs Abs: 0.8 10*3/uL (ref 0.7–4.0)
MCH: 34.6 pg — ABNORMAL HIGH (ref 26.0–34.0)
MCHC: 35.2 g/dL (ref 30.0–36.0)
MCV: 98.2 fL (ref 80.0–100.0)
Monocytes Absolute: 0.5 10*3/uL (ref 0.1–1.0)
Monocytes Relative: 9 %
Neutro Abs: 5 10*3/uL (ref 1.7–7.7)
Neutrophils Relative %: 78 %
Platelet Count: 256 10*3/uL (ref 150–400)
RBC: 3.96 MIL/uL (ref 3.87–5.11)
RDW: 12.8 % (ref 11.5–15.5)
WBC Count: 6.4 10*3/uL (ref 4.0–10.5)
nRBC: 0 % (ref 0.0–0.2)

## 2022-04-26 MED ORDER — IOHEXOL 300 MG/ML  SOLN
100.0000 mL | Freq: Once | INTRAMUSCULAR | Status: AC | PRN
  Administered 2022-04-26: 100 mL via INTRAVENOUS

## 2022-04-26 MED ORDER — SODIUM CHLORIDE (PF) 0.9 % IJ SOLN
INTRAMUSCULAR | Status: AC
  Filled 2022-04-26: qty 50

## 2022-05-04 ENCOUNTER — Inpatient Hospital Stay (HOSPITAL_BASED_OUTPATIENT_CLINIC_OR_DEPARTMENT_OTHER): Payer: Medicare Other | Admitting: Hematology

## 2022-05-04 ENCOUNTER — Other Ambulatory Visit: Payer: Self-pay

## 2022-05-04 VITALS — BP 142/64 | HR 67 | Temp 98.4°F | Wt 165.7 lb

## 2022-05-04 DIAGNOSIS — C210 Malignant neoplasm of anus, unspecified: Secondary | ICD-10-CM

## 2022-05-04 DIAGNOSIS — Z08 Encounter for follow-up examination after completed treatment for malignant neoplasm: Secondary | ICD-10-CM | POA: Diagnosis not present

## 2022-05-04 NOTE — Progress Notes (Signed)
[place]   [phone] [phone]   Clinic Follow up Note   Patient Care Team: Shirline Frees, MD as PCP - General (Family Medicine) Leighton Ruff, MD as Consulting Physician (General Surgery) Salvadore Dom, MD as Consulting Physician (Obstetrics and Gynecology) Truitt Merle, MD as Consulting Physician (Hematology) Leonie Man, MD as Consulting Physician (Cardiology)  Date of Service:  05/04/2022  CHIEF COMPLAINT: f/u of anal cancer  CURRENT THERAPY:  Surveillance  ASSESSMENT & PLAN:  Angelica Wright is a 66 y.o. female with   1. Anal Cancer, with positive left pelvic lymph node, cT2N1bM0, stage IIIA -diagnosed in 09/2018, treated with concurrent chemoRT on 11/04/18 - 12/15/18.  -PET 04/14/19 showed complete radiographic response -last scope with Dr. Marcello Moores on 02/06/22 was benign. -surveillance CT A/P 04/26/22 showed NED. I do not plan for further scans. -She is clinically doing well. Labs from 6/22 reviewed, overall WNL. Physical exam unremarkable except mild external hemorrhoids. -she is now over 3 years out from diagnosis. I reviewed that her risk of recurrence is much less now. We will continue to follow her every 6 months to complete 5 years.   2. HTN and DM -Continue medication, not on diabetic medication. She is being managed by her PCP  -BG from 04/26/22 was up to 187; she denies eating before labs.   3. 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  -she continues to do pelvic floor exercises at home -she was prescribed estradiol for her vaginal stenosis.   4. Cancer Screenings, Osteoporosis -genetic testing 03/2018 was negative except for VUS of gene PMS2. -Her 10/2019 DEXA showed osteoporosis with lowest T-score -3 at left femur neck. Repeat on 12/19/21 showed some improvement to  -2.8 at [facility] (left femur improved to -2.7). -Most recent Pap 12/2020, negative -Most recent mammogram 04/2021, negative. She is due for repeat.     PLAN:  -she is clinically doing well -f/u with Dr. Marcello Moores in 08/2022 -lab and f/u in 7 months   No problem-specific Assessment & Plan notes found for this encounter.   SUMMARY OF ONCOLOGIC HISTORY: Oncology History  Anal cancer ([place])  03/19/2018 Genetic Testing   Genetic testing performed through [facility]'s Common Hereditary Cancers Panel reported out on 03/10/2018 showed no pathogenic mutations. The Common Hereditary Cancer Panel offered by [facility] includes sequencing and/or deletion duplication testing of the following 47 genes: APC, ATM, AXIN2, BARD1, BMPR1A, BRCA1, BRCA2, BRIP1, CDH1, CDKN2A (p14ARF), CDKN2A (p16INK4a), CKD4, CHEK2, CTNNA1, DICER1, EPCAM (Deletion/duplication testing only), GREM1 (promoter region deletion/duplication testing only), KIT, MEN1, MLH1, MSH2, MSH3, MSH6, MUTYH, NBN, NF1, NHTL1, PALB2, PDGFRA, PMS2, POLD1, POLE, PTEN, RAD50, RAD51C, RAD51D, SDHB, SDHC, SDHD, SMAD4, SMARCA4. STK11, TP53, TSC1, TSC2, and VHL.  The following genes were evaluated for sequence changes only: SDHA and HOXB13 c.251G>A variant only.   A variant of uncertain significance (VUS) in a gene called PMS2 was also noted. c.1399G>A (p.Val467Ile)   08/14/2018 Imaging   MRI PELVIS W WO Contrast 08/14/18 IMPRESSION: 1. Centrally necrotic 1.8 cm enlarged lymph node versus nonspecific soft tissue mass in the left hemipelvis adjacent to the posterior pelvic sidewall. Further evaluation with biopsy and/or PET-CT is recommended.   09/17/2018 Pathology Results   Diagnosis 09/17/18 Lymph node, needle/core biopsy, left pelvic - SQUAMOUS CELL CARCINOMA. - SEE COMMENT. Microscopic Comment   10/09/2018 PET scan  PET 10/09/18  IMPRESSION: 1. 17 mm left pelvic sidewall lesion is markedly hypermetabolic compatible with known malignancy. 2. No  additional definite hypermetabolic disease identified in the neck, chest, abdomen, or pelvis. 3. Relatively diffuse FDG accumulation in the colon without focal increased uptake above background colonic levels in the region of the anus. 4. Focal FDG accumulation identified along the anterior left femoral neck and in the lumbosacral junction. No underlying bone lesions evident at these locations on CT images. Indeterminate finding although metastatic disease cannot be excluded.    10/13/2018 Initial Biopsy   Diagnosis 10/13/18 Anus, biopsy - SQUAMOUS CELL CARCINOMA. - SEE COMMENT.   10/14/2018 Initial Diagnosis   Anal cancer ([place])   10/14/2018 Cancer Staging   Staging form: Anus, AJCC 8th Edition - Clinical stage from 10/14/2018: Stage IIIA (cT2, cN1b, cM0) - Signed by Truitt Merle, MD on 10/14/2018   11/03/2018 - 12/15/2018 Radiation Therapy   Concurrent chemoRt with Dr. Lisbeth Renshaw for 5-6 weeks starting 11/03/18. Plans to complete on 12/15/18   11/03/2018 - 12/15/2018 Chemotherapy   Concurrent chemoRt with Mitomycin and 5-FU on week 1 and week 5 starting 11/03/18. Week 5 chemo on 12/01/18   04/14/2019 PET scan   PET 04/14/19  IMPRESSION: 1. The hypermetabolic 17 mm left pelvic sidewall lesion seen on the previous study has resolved in the interval. No new or progressive hypermetabolic disease in the neck, chest, abdomen, or pelvis on today's study. 2. Hepatic steatosis. 3. Colonic diverticulosis without diverticulitis.     04/14/2020 Imaging   CT CAP w contrast IMPRESSION: 1. Thickening and stranding about the anus, see PET-CT for further detail. No mass lesion grossly on this evaluation. 2. No evidence of metastatic disease in the chest, abdomen or pelvis. 3. Marked hepatic steatosis. Signs of recanalization of the umbilical vein, correlate with any clinical signs of liver disease. This is indicative of portal hypertension but is seen in the absence of splenomegaly. Note that the  portal vein is patent into the liver. 4. Recanalized umbilical vein with varix at the umbilicus. 5. Aortic atherosclerosis.   Aortic Atherosclerosis (ICD10-I70.0).   05/01/2021 Imaging   CT A/P w contrast  IMPRESSION: 1. No evidence of anal carcinoma recurrence or metastasis. 2. No abdominopelvic lymphadenopathy. 3. Scattered colonic diverticulosis without diverticulitis.      INTERVAL HISTORY:  Grettell Ransdell Scheff is here for a follow up of anal cancer. She was last seen by me on 11/16/21. She presents to the clinic accompanied by her husband. She reports she is doing well overall. She reports some rectal discomfort with bowel movements. She notes she has had some bleeding but attributes this to hemorrhoids; she thinks she hit it somehow as she also reports pain.   All other systems were reviewed with the patient and are negative.  MEDICAL HISTORY:  Past Medical History:  Diagnosis Date   Aortic atherosclerosis ([place])    Essential hypertension    Hyperlipidemia associated with type 2 diabetes mellitus ([place])    Hyperparathyroidism ([place])    resolved after surgery   Nonalcoholic steatohepatitis ([facility])    Osteoarthritis    Hands, feet, knees   Osteoporosis    of the spine   Rectocele    Sensorineural hearing loss (SNHL) of both ears    Squamous cell cancer, anus ([place])    (Dr. Ermelinda Das -oncology) -radiation and chemo therapy   TMJ syndrome    Type 2 Diabetes ([place]) 11/2013   controlled with diet/exercise    SURGICAL HISTORY: Past Surgical  History:  Procedure Laterality Date   LYMPH NODE BIOPSY     PARATHYROIDECTOMY  2000   THERAPEUTIC ABORTION     x 2   VULVA /PERINEUM BIOPSY  2012    I have reviewed the social history and family history with the patient and they are unchanged from previous note.  ALLERGIES:  is allergic to polymyxin b-trimethoprim and sulfa antibiotics.  MEDICATIONS:  Current Outpatient Medications  Medication Sig Dispense Refill   valACYclovir  (VALTREX) 500 MG tablet TAKE ONE TAB BY MOUTH TWICE A DAY FOR 3 DAYS WITH FLARES. 30 tablet 1   aspirin 81 MG chewable tablet Chew 81 mg by mouth daily.     atenolol (TENORMIN) 100 MG tablet Take 100 mg by mouth daily.     atorvastatin (LIPITOR) 20 MG tablet Take 1 tablet by mouth daily.     Blood Glucose Monitoring Suppl (ACCU-CHEK GUIDE) w/Device KIT daily.     Cholecalciferol (VITAMIN D3) 125 MCG (5000 UT) CAPS Take 5,000 Units by mouth daily.     clobetasol ointment (TEMOVATE) 0.05 % Apply a pea sized amount topically 1-2 x a week and  BID x 1-2 weeks prn flare 30 g 1   hydrochlorothiazide (HYDRODIURIL) 12.5 MG tablet Take 12.5 mg by mouth daily.      Lancets (ACCU-CHEK MULTICLIX) lancets See admin instructions.     Magnesium 400 MG TABS See admin instructions.     nystatin cream (MYCOSTATIN) Apply 1 application topically 2 (two) times daily. Apply to affected area BID for up to 7 days. 30 g 1   vitamin C (ASCORBIC ACID) 500 MG tablet Take 500 mg by mouth daily.     YUVAFEM 10 MCG TABS vaginal tablet PLACE ONE TABLET VAGINALLY 2 TIMES A WEEK 24 tablet 2   No current facility-administered medications for this visit.    PHYSICAL EXAMINATION: ECOG PERFORMANCE STATUS: 0 - Asymptomatic  Vitals:   05/04/22 0802  BP: (!) 142/64  Pulse: 67  Temp: 98.4 F (36.9 C)  SpO2: 99%   Wt Readings from Last 3 Encounters:  05/04/22 165 lb 11.2 oz (75.2 kg)  12/27/21 168 lb (76.2 kg)  11/16/21 168 lb 12.8 oz (76.6 kg)     GENERAL:alert, no distress and comfortable SKIN: skin color, texture, turgor are normal, no rashes or significant lesions EYES: normal, Conjunctiva are pink and non-injected, sclera clear  NECK: supple, thyroid normal size, non-tender, without nodularity LYMPH:  no palpable lymphadenopathy in the cervical, axillary or inguinal LUNGS: clear to auscultation and percussion with normal breathing effort HEART: regular rate & rhythm and no murmurs and no lower extremity  edema ABDOMEN:abdomen soft, non-tender and normal bowel sounds Musculoskeletal:no cyanosis of digits and no clubbing  NEURO: alert & oriented x 3 with fluent speech, no focal motor/sensory deficits RECTAL: No palpable mass. No blood on glove. Normal stool present. Benign exam    LABORATORY DATA:  I have reviewed the data as listed    Latest Ref Rng & Units 04/26/2022    7:52 AM 11/16/2021    7:53 AM 04/18/2021    8:54 AM  CBC  WBC 4.0 - 10.5 K/uL 6.4  5.8  5.7   Hemoglobin 12.0 - 15.0 g/dL 13.7  13.2  13.3   Hematocrit 36.0 - 46.0 % 38.9  38.7  38.6   Platelets 150 - 400 K/uL 256  248  253         Latest Ref Rng & Units 04/26/2022    7:52 AM  11/16/2021    7:53 AM 04/18/2021    8:54 AM  CMP  Glucose 70 - 99 mg/dL 187  162  148   BUN 8 - 23 mg/dL '15  18  20   ' Creatinine 0.44 - 1.00 mg/dL 0.78  0.75  0.77   Sodium 135 - 145 mmol/L 142  141  142   Potassium 3.5 - 5.1 mmol/L 4.3  4.1  3.9   Chloride 98 - 111 mmol/L 106  107  109   CO2 22 - 32 mmol/L '27  26  25   ' Calcium 8.9 - 10.3 mg/dL 10.1  9.7  9.8   Total Protein 6.5 - 8.1 g/dL 7.7  7.3  7.6   Total Bilirubin 0.3 - 1.2 mg/dL 0.6  0.4  0.5   Alkaline Phos 38 - 126 U/L 89  94  109   AST 15 - 41 U/L 38  36  40   ALT 0 - 44 U/L 58  56  63       RADIOGRAPHIC STUDIES: I have personally reviewed the radiological images as listed and agreed with the findings in the report. No results found.    No orders of the defined types were placed in this encounter.  All questions were answered. The patient knows to call the clinic with any problems, questions or concerns. No barriers to learning was detected. The total time spent in the appointment was 25 minutes.     Truitt Merle, MD 05/04/2022   I, Wilburn Mylar, am acting as scribe for Truitt Merle, MD.   I have reviewed the above documentation for accuracy and completeness, and I agree with the above.

## 2022-05-22 ENCOUNTER — Other Ambulatory Visit: Payer: Self-pay | Admitting: Family Medicine

## 2022-05-22 DIAGNOSIS — Z1231 Encounter for screening mammogram for malignant neoplasm of breast: Secondary | ICD-10-CM

## 2022-06-19 ENCOUNTER — Ambulatory Visit
Admission: RE | Admit: 2022-06-19 | Discharge: 2022-06-19 | Disposition: A | Discharge status: Home / Self Care | Payer: Medicare Other | Source: Ambulatory Visit | Attending: Family Medicine | Admitting: Family Medicine

## 2022-06-19 DIAGNOSIS — Z1231 Encounter for screening mammogram for malignant neoplasm of breast: Secondary | ICD-10-CM

## 2022-08-16 ENCOUNTER — Ambulatory Visit (INDEPENDENT_AMBULATORY_CARE_PROVIDER_SITE_OTHER): Payer: Medicare Other

## 2022-08-16 ENCOUNTER — Ambulatory Visit (INDEPENDENT_AMBULATORY_CARE_PROVIDER_SITE_OTHER): Payer: Medicare Other | Admitting: Podiatry

## 2022-08-16 DIAGNOSIS — M19071 Primary osteoarthritis, right ankle and foot: Secondary | ICD-10-CM

## 2022-08-16 DIAGNOSIS — M79671 Pain in right foot: Secondary | ICD-10-CM

## 2022-08-16 DIAGNOSIS — M778 Other enthesopathies, not elsewhere classified: Secondary | ICD-10-CM

## 2022-08-16 NOTE — Progress Notes (Signed)
Subjective:   Patient ID: Angelica Wright, female   DOB: 66 y.o.   MRN: [id number]   HPI Chief Complaint  Patient presents with   Foot Pain    Right foot pain started 3 weeks ago, top of the foot, X-Rays done today Diabetic    66 year old female presents with above concerns.  The foot swelled but no redness. The swelling went down some. No injuries when this started. She is not able to exercise because of arthritis in the knee. . She does get numbness and tingling but she is diabetic and that is on both feet.  She tried heat which helped and walked to help with circulation and also tried elevation she does wear compression socks.    She is having vein surgery next month.   BS- 130's Last A1c was 6.7- trying to control diet.    Review of Systems  All other systems reviewed and are negative.  Past Medical History:  Diagnosis Date   Aortic atherosclerosis ([place])    Essential hypertension    Hyperlipidemia associated with type 2 diabetes mellitus ([place])    Hyperparathyroidism ([place])    resolved after surgery   Nonalcoholic steatohepatitis ([facility])    Osteoarthritis    Hands, feet, knees   Osteoporosis    of the spine   Rectocele    Sensorineural hearing loss (SNHL) of both ears    Squamous cell cancer, anus ([place])    (Dr. Ermelinda Das -oncology) -radiation and chemo therapy   TMJ syndrome    Type 2 Diabetes ([place]) 11/2013   controlled with diet/exercise    Past Surgical History:  Procedure Laterality Date   LYMPH NODE BIOPSY     PARATHYROIDECTOMY  2000   THERAPEUTIC ABORTION     x 2   VULVA /PERINEUM BIOPSY  2012     Current Outpatient Medications:    valACYclovir (VALTREX) 500 MG tablet, TAKE ONE TAB BY MOUTH TWICE A DAY FOR 3 DAYS WITH FLARES., Disp: 30 tablet, Rfl: 1   aspirin 81 MG chewable tablet, Chew 81 mg by mouth daily., Disp: , Rfl:    atenolol (TENORMIN) 100 MG tablet, Take 100 mg by mouth daily., Disp: , Rfl:    atorvastatin (LIPITOR) 20 MG tablet, Take 1 tablet by  mouth daily., Disp: , Rfl:    Blood Glucose Monitoring Suppl (ACCU-CHEK GUIDE) w/Device KIT, daily., Disp: , Rfl:    Cholecalciferol (VITAMIN D3) 125 MCG (5000 UT) CAPS, Take 5,000 Units by mouth daily., Disp: , Rfl:    clobetasol ointment (TEMOVATE) 0.05 %, Apply a pea sized amount topically 1-2 x a week and  BID x 1-2 weeks prn flare, Disp: 30 g, Rfl: 1   hydrochlorothiazide (HYDRODIURIL) 12.5 MG tablet, Take 12.5 mg by mouth daily. , Disp: , Rfl:    Lancets (ACCU-CHEK MULTICLIX) lancets, See admin instructions., Disp: , Rfl:    Magnesium 400 MG TABS, See admin instructions., Disp: , Rfl:    nystatin cream (MYCOSTATIN), Apply 1 application topically 2 (two) times daily. Apply to affected area BID for up to 7 days., Disp: 30 g, Rfl: 1   vitamin C (ASCORBIC ACID) 500 MG tablet, Take 500 mg by mouth daily., Disp: , Rfl:    YUVAFEM 10 MCG TABS vaginal tablet, PLACE ONE TABLET VAGINALLY 2 TIMES A WEEK, Disp: 24 tablet, Rfl: 2  Allergies  Allergen Reactions   Polymyxin B-Trimethoprim Itching   Sulfa Antibiotics Nausea And Vomiting  Objective:  Physical Exam  General: AAO x3, NAD  Dermatological: Skin is warm, dry and supple bilateral.  There are no open sores, no preulcerative lesions, no rash or signs of infection present.  Vascular: Dorsalis Pedis artery and Posterior Tibial artery pedal pulses are 2/4 bilateral with immedate capillary fill time. There is no pain with calf compression, swelling, warmth, erythema.   Neruologic: Grossly intact via light touch bilateral.   Musculoskeletal: There is tenderness to palpation of the dorsal aspect of the midfoot on the course of the Lisfranc joint.  There is some mild edema still present dorsal aspect of foot there is no erythema or warmth.  Flexor, extensor tendons appear to be intact.  Muscular strength 5/5 in all groups tested bilateral.  Gait: Unassisted, Nonantalgic.       Assessment:   66 year old female  capsulitis/arthritis right foot     Plan:  -Treatment options discussed including all alternatives, risks, and complications -Etiology of symptoms were discussed -X-rays were obtained and reviewed with the patient.  3 views of the right foot were obtained.  No evidence of acute fracture.  I do believe there are some arthritic changes present along the Lisfranc joint as well as second metatarsal cuneiform joint. -We discussed the injection.  She can use alternative gel.  Discussed wearing stiffer soled shoes and good arch support to help.  Icing daily.   Trula Slade DPM

## 2022-09-03 ENCOUNTER — Other Ambulatory Visit: Payer: Self-pay | Admitting: Podiatry

## 2022-09-03 DIAGNOSIS — M778 Other enthesopathies, not elsewhere classified: Secondary | ICD-10-CM

## 2022-10-01 ENCOUNTER — Other Ambulatory Visit: Payer: Self-pay | Admitting: Family Medicine

## 2022-10-01 ENCOUNTER — Ambulatory Visit
Admission: RE | Admit: 2022-10-01 | Discharge: 2022-10-01 | Disposition: A | Discharge status: Home / Self Care | Payer: Medicare Other | Source: Ambulatory Visit | Attending: Family Medicine | Admitting: Family Medicine

## 2022-10-01 DIAGNOSIS — R058 Other specified cough: Secondary | ICD-10-CM

## 2022-10-22 ENCOUNTER — Ambulatory Visit: Payer: Medicare Other | Attending: Cardiology | Admitting: Cardiology

## 2022-10-22 ENCOUNTER — Encounter: Payer: Self-pay | Admitting: Cardiology

## 2022-10-22 VITALS — BP 118/70 | HR 66 | Ht 61.0 in | Wt 160.0 lb

## 2022-10-22 DIAGNOSIS — I10 Essential (primary) hypertension: Secondary | ICD-10-CM | POA: Insufficient documentation

## 2022-10-22 DIAGNOSIS — R072 Precordial pain: Secondary | ICD-10-CM | POA: Insufficient documentation

## 2022-10-22 DIAGNOSIS — E1169 Type 2 diabetes mellitus with other specified complication: Secondary | ICD-10-CM | POA: Diagnosis present

## 2022-10-22 DIAGNOSIS — E119 Type 2 diabetes mellitus without complications: Secondary | ICD-10-CM | POA: Diagnosis not present

## 2022-10-22 DIAGNOSIS — R0609 Other forms of dyspnea: Secondary | ICD-10-CM | POA: Insufficient documentation

## 2022-10-22 DIAGNOSIS — E785 Hyperlipidemia, unspecified: Secondary | ICD-10-CM | POA: Insufficient documentation

## 2022-10-22 DIAGNOSIS — E1165 Type 2 diabetes mellitus with hyperglycemia: Secondary | ICD-10-CM

## 2022-10-22 DIAGNOSIS — I700 Atherosclerosis of aorta: Secondary | ICD-10-CM

## 2022-10-22 LAB — BASIC METABOLIC PANEL
BUN/Creatinine Ratio: 20 (ref 12–28)
BUN: 13 mg/dL (ref 8–27)
CO2: 23 mmol/L (ref 20–29)
Calcium: 9.5 mg/dL (ref 8.7–10.3)
Chloride: 105 mmol/L (ref 96–106)
Creatinine, Ser: 0.64 mg/dL (ref 0.57–1.00)
Glucose: 119 mg/dL — ABNORMAL HIGH (ref 70–99)
Potassium: 4.2 mmol/L (ref 3.5–5.2)
Sodium: 143 mmol/L (ref 134–144)
eGFR: 98 mL/min/{1.73_m2} (ref >59)

## 2022-10-22 NOTE — Progress Notes (Unsigned)
Primary Care Provider: Shirline Frees, MD [place] Cardiologist: None Electrophysiologist: None  Clinic Note: Chief Complaint  Patient presents with   Follow-up    1 year.    ===================================  ASSESSMENT/PLAN   Problem List Items Addressed This Visit   None   ===================================  HPI:    Angelica Wright is a 66 y.o. female with a PMH notable for mild DM-2, HTN, HLD who presents today for delayed follow-up.  Angelica Wright was last seen on Mar 09, 2021 for evaluation of chest pain shortness of breath with air hunger. -> We ordered a 2D echo and Coronary Calcium Score.  Neither 1 have been done.  Recent Hospitalizations: ***  Reviewed  CV studies:    The following studies were reviewed today: (if available, images/films reviewed: From Epic Chart or Care Everywhere) ***:  Interval History:   Angelica Wright presents today indicating that she did not get the test done last time because of insurance issue where the coverage was not enough for her to be able for the test.  She continues to have exertional dyspnea that is now exacerbated by the fact that she has had an ongoing cold condition for the last several months.  But she has noted however even predating her cold is that she has been having episodes of tightness in her chest that she describes in the upper chest region to her throat that can happen if she continues to exert beyond the level of dyspnea.  That can happen at least 2 or 3 times a week but she can get short of breath simply doing routine activity like going up flights steps.  If she were to go for second flight of steps she may notice a tightness in her throat/upper chest.  She has rare intermittent palpitations but nothing prolonged.  It did get worse when she took decongestant medicine for her URI symptoms.  She therefore does not take that anymore because she felt really strange with fast heart rates after  taking Sudafed.  Otherwise palpitations been pretty well-controlled there but nothing that worries her.  She has chronic lower extremity swelling but no PND or orthopnea.  She has diabetes but is not on any medications, usually controls with diet.  She does not necessarily exercise. She has not started on statin her lipids look much better controlled.  Her blood pressures also been pretty well-controlled of late.  CV Review of Symptoms (Summary): Cardiovascular ROS: {[id number]}  REVIEWED OF SYSTEMS   ROS  espiratory: Positive for shortness of breath (With exertion, but with hunger (not postprandial)).   Cardiovascular: Positive for chest pain (with hunger). Negative for leg swelling. Gastrointestinal: Negative for blood in stool and melena. Genitourinary: Negative for hematuria. Musculoskeletal: Positive for joint pain. Negative for falls and myalgias. Neurological: Negative for dizziness, focal weakness, weakness and headaches. Psychiatric/Behavioral: Negative for depression and memory loss. The patient has insomnia (uses trazodone).   I have reviewed and (if needed) personally updated the patient's problem list, medications, allergies, past medical and surgical history, social and family history.   PAST MEDICAL HISTORY   Past Medical History:  Diagnosis Date   Aortic atherosclerosis ([place])    Essential hypertension    Hyperlipidemia associated with type 2 diabetes mellitus ([place])    Hyperparathyroidism ([place])    resolved after surgery   Nonalcoholic steatohepatitis ([facility])    Osteoarthritis    Hands, feet, knees   Osteoporosis    of the spine   Rectocele  Sensorineural hearing loss (SNHL) of both ears    Squamous cell cancer, anus (H[facility])    (Dr. Ermelinda Das -oncology) -radiation and chemo therapy   TMJ syndrome    Type 2 Diabetes ([place]) 11/2013   controlled with diet/exercise    PAST SURGICAL HISTORY   Past Surgical History:  Procedure Laterality Date   LYMPH NODE BIOPSY      PARATHYROIDECTOMY  2000   THERAPEUTIC ABORTION     x 2   VULVA /PERINEUM BIOPSY  2012    Immunization History  Administered Date(s) Administered   Influenza Split 08/18/2009, 08/29/2010, 08/29/2011, 08/07/2013, 08/12/2014   Influenza, Quadrivalent, Recombinant, Inj, Pf 07/31/2019, 08/05/2020, 08/14/2021   Influenza,inj,Quad PF,6+ Mos 08/16/2016, 08/21/2017, 07/29/2018   Influenza-Unspecified 08/25/2015, 08/14/2016, 08/05/2020   PFIZER Comirnaty(Gray Top)Covid-19 Tri-Sucrose Vaccine 01/22/2020, 02/12/2020, 10/09/2020   PFIZER(Purple Top)SARS-COV-2 Vaccination 01/22/2020, 02/12/2020, 10/09/2020   Td 01/04/1998   Tdap 05/27/2008, 11/05/2010, 12/22/2020    MEDICATIONS/ALLERGIES   Current Meds  Medication Sig   aspirin 81 MG chewable tablet Chew 81 mg by mouth daily.   atenolol (TENORMIN) 100 MG tablet Take 100 mg by mouth daily.   atorvastatin (LIPITOR) 20 MG tablet Take 1 tablet by mouth daily.   Blood Glucose Monitoring Suppl (ACCU-CHEK GUIDE) w/Device KIT daily.   clobetasol ointment (TEMOVATE) 0.05 % Apply a pea sized amount topically 1-2 x a week and  BID x 1-2 weeks prn flare   fluticasone (FLONASE) 50 MCG/ACT nasal spray Place 1 spray into both nostrils daily.   hydrochlorothiazide (HYDRODIURIL) 12.5 MG tablet Take 12.5 mg by mouth daily.    Lancets (ACCU-CHEK MULTICLIX) lancets See admin instructions.   Multiple Vitamin (MULTIVITAMIN ADULT PO) Take 1 tablet by mouth daily.   valACYclovir (VALTREX) 500 MG tablet TAKE ONE TAB BY MOUTH TWICE A DAY FOR 3 DAYS WITH FLARES.   vitamin C (ASCORBIC ACID) 500 MG tablet Take 500 mg by mouth daily.   YUVAFEM 10 MCG TABS vaginal tablet PLACE ONE TABLET VAGINALLY 2 TIMES A WEEK    Allergies  Allergen Reactions   Polymyxin B-Trimethoprim Itching   Sulfa Antibiotics Nausea And Vomiting    SOCIAL HISTORY/FAMILY HISTORY   Reviewed in Epic:  Pertinent findings:  Social History   Tobacco Use   Smoking status: Never   Smokeless  tobacco: Never  Vaping Use   Vaping Use: Never used  Substance Use Topics   Alcohol use: Yes    Alcohol/week: 7.0 standard drinks of alcohol    Types: 7 Glasses of wine per week   Drug use: No   Social History   Social History Narrative   Does not exercise routinely because of bone-on-bone osteoarthritis pains in her knees.-Tries to walk much as she can.   [place], EKG, labs   Wt Readings from Last 3 Encounters:  10/22/22 160 lb (72.6 kg)  05/04/22 165 lb 11.2 oz (75.2 kg)  12/27/21 168 lb (76.2 kg)    Physical Exam: BP 118/70 (BP Location: Left Arm, Patient Position: Sitting, Cuff Size: Normal)   Pulse 66   Ht _0  (1.549 m)   Wt 160 lb (72.6 kg)   LMP 11/05/2002 (Approximate)   BMI 30.23 kg/m  Physical Exam  Obese.  Chest wall tenderness.   Adult ECG Report  Rate: 66 ;  Rhythm: normal sinus rhythm and CRO Ant  MI, age indeterminate ; normal axis, intervals & durations  Narrative Interpretation: no real change   Recent Labs:  reviewed  No results found for: "CHOL", "HDL", "LDLCALC", "LDLDIRECT", "TRIG", "CHOLHDL" Lab Results  Component Value Date   CREATININE 0.78 04/26/2022   BUN 15 04/26/2022   NA 142 04/26/2022   K 4.3 04/26/2022   CL 106 04/26/2022   CO2 27 04/26/2022      Latest Ref Rng & Units 04/26/2022    7:52 AM 11/16/2021    7:53 AM 04/18/2021    8:54 AM  CBC  WBC 4.0 - 10.5 K/uL 6.4  5.8  5.7   Hemoglobin 12.0 - 15.0 g/dL 13.7  13.2  13.3   Hematocrit 36.0 - 46.0 % 38.9  38.7  38.6   Platelets 150 - 400 K/uL 256  248  253     No results found for: "HGBA1C" Lab Results  Component Value Date   TSH 1.67 06/29/2019    ================================================== I spent a total of ***minutes with the patient spent in direct patient consultation.  Additional time spent with chart review  / charting (studies, outside notes, etc): *** min Total Time: *** min  Current medicines are reviewed at length with the  patient today.  (+/- concerns) ***  Notice: This dictation was prepared with Dragon dictation along with smart phrase technology. Any transcriptional errors that result from this process are unintentional and may not be corrected upon review.  Studies Ordered:   No orders of the defined types were placed in this encounter.  No orders of the defined types were placed in this encounter.   Patient Instructions / Medication Changes & Studies & Tests Ordered   There are no Patient Instructions on file for this visit.     Leonie Man, MD, MS Glenetta Hew, M.D., M.S. Interventional Cardiologist  [place]  Pager # [phone] Phone # [phone] [address] [place], [place] [address]   Thank you for choosing [facility] at [facility]!!

## 2022-10-22 NOTE — Patient Instructions (Addendum)
Medication Instructions:  See below instructions  *If you need a refill on your cardiac medications before your next appointment, please call your pharmacy*   Lab Work: See below instructions  If you have labs (blood work) drawn today and your tests are completely normal, you will receive your results only by: [place] (if you have MyChart) OR A paper copy in the mail If you have any lab test that is abnormal or we need to change your treatment, we will call you to review the results.   Testing/Procedures: WILL BE SCHEDULE AT [place] Your physician has requested that you have an echocardiogram. Echocardiography is a painless test that uses sound waves to create images of your heart. It provides your doctor with information about the size and shape of your heart and how well your heart's chambers and valves are working. This procedure takes approximately one hour. There are no restrictions for this procedure. Please do NOT wear cologne, perfume, aftershave, or lotions (deodorant is allowed). Please arrive 15 minutes prior to your appointment time.  AND  WILL BE SCHEDULE AT [place] physician has requested that you have coronary  CTA. Coronary computed tomography (CT)angiogram  is a special type of CT scan that uses a computer to produce multi-dimensional views of major blood vessels throughout the heart.  CT angiography, a contrast material is injected through an IV to help visualize the blood vessels  a painless test that uses an x-ray machine to take clear, detailed pictures of your heart arteries .  Please follow instruction sheet as given.   Follow-Up: At [facility], you and your health needs are our priority.  As part of our continuing mission to provide you with exceptional heart care, we have created designated Provider Care Teams.  These Care Teams include your primary  Cardiologist (physician) and Advanced Practice Providers (APPs -  Physician Assistants and Nurse Practitioners) who all work together to provide you with the care you need, when you need it.     Your next appointment:   2 TO 3 month(s)  The format for your next appointment:   In Person  Provider:   Glenetta Hew, MD    Other Instructions     Your cardiac CT will be scheduled at the below location:   [facility] [address] [place], [place] [address] [phone]    Please arrive at the [facility] (Entrance C2) of [facility] 30 minutes prior to test start time. You can use the FREE valet parking offered at entrance C (encouraged to control the heart rate for the test)  Proceed to the [facility] Radiology Department (first floor) to check-in and test prep.  All radiology patients and guests should use entrance C2 at [facility], accessed from [facility], even though the hospital's physical address listed is [address]       Please follow these instructions carefully (unless otherwise directed):  BMP at least 1 week prior to test  On the Night Before the Test: Be sure to Drink plenty of water. Do not consume any caffeinated/decaffeinated beverages or chocolate 12 hours prior to your test. Do not take any antihistamines 12 hours prior to your test.   On the Day of the Test: Drink plenty of water until 1 hour prior to the test. Do not eat any food 1  hour prior to test. You may take your regular medications prior to the test.  Take tour regular dose of Atenolol 100 mg  two hours prior to test. HOLD Hydrochlorothiazide morning of the test. FEMALES- please wear underwire-free bra if available, avoid dresses & tight clothing        After the Test: Drink plenty of water. After receiving IV contrast, you may experience a mild flushed feeling. This is normal. On occasion, you may  experience a mild rash up to 24 hours after the test. This is not dangerous. If this occurs, you can take Benadryl 25 mg and increase your fluid intake. If you experience trouble breathing, this can be serious. If it is severe call 911 IMMEDIATELY. If it is mild, please call our office.  We will call to schedule your test 2-4 weeks out understanding that some insurance companies will need an authorization prior to the service being performed.   For non-scheduling related questions, please contact the cardiac imaging nurse navigator should you have any questions/concerns: Marchia Bond, Cardiac Imaging Nurse Navigator Gordy Clement, Cardiac Imaging Nurse Navigator [facility] Direct Office Dial: [phone]   For scheduling needs, including cancellations and rescheduling, please call [place], [phone].

## 2022-10-23 ENCOUNTER — Encounter: Payer: Self-pay | Admitting: Cardiology

## 2022-10-23 NOTE — Assessment & Plan Note (Signed)
Lipids are pretty well-controlled with an LDL of 51 on 20 mg atorvastatin.  No change.

## 2022-10-23 NOTE — Assessment & Plan Note (Signed)
Exertional precordial pain is concerning for potential angina.  Warrants ischemic evaluation since it is now getting worse.  Plan: Ischemic Evaluation with Coronary CT Angiogram-CT FFR will assist with determination of ischemic lesions.

## 2022-10-23 NOTE — Assessment & Plan Note (Signed)
Quite likely associated with deconditioning, but now with some concerning chest tightness, need to do a more complete cardiac evaluation.  Unfortunately the echocardiogram has not been done.  With the more prominent chest tightness symptoms, we will also evaluate for ischemia as opposed to just plain Coronary Calcium Score.  Plan: 2D Echo and Coronary CTA

## 2022-10-23 NOTE — Assessment & Plan Note (Signed)
BP looks pretty well-controlled on atenolol which she has been on for a long time along with HCTZ.

## 2022-10-23 NOTE — Assessment & Plan Note (Signed)
She says she controls her diabetes with diet and exercise.  However she does not necessarily watch her diet that much and has not been exercising recently.  Her most recent A1c was 7.0.  Suspect that she may be borderline initiation of therapy, but is reluctant to do so.  Was also Coronary CTA will help risk stratify.

## 2022-11-02 ENCOUNTER — Telehealth (HOSPITAL_COMMUNITY): Payer: Self-pay | Admitting: Emergency Medicine

## 2022-11-02 NOTE — Telephone Encounter (Signed)
Attempted to call patient regarding upcoming cardiac CT appointment. °Left message on voicemail with name and callback number °Deray Dawes RN Navigator Cardiac Imaging °[facility] °[phone] Office °[phone] Cell ° °

## 2022-11-06 ENCOUNTER — Telehealth (HOSPITAL_COMMUNITY): Payer: Self-pay | Admitting: Emergency Medicine

## 2022-11-06 NOTE — Telephone Encounter (Signed)
Reaching out to patient to offer assistance regarding upcoming cardiac imaging study; pt verbalizes understanding of appt date/time, parking situation and where to check in, pre-test NPO status and medications ordered, and verified current allergies; name and call back number provided for further questions should they arise Angelica Bond RN Navigator Cardiac Imaging [facility] [phone] office [phone] cell  Arrival 830 WC entrance '100mg'$  atenolol 2 hr prior Holding flonase, HCTZ Denies iv issues

## 2022-11-07 ENCOUNTER — Ambulatory Visit (HOSPITAL_COMMUNITY)
Admission: RE | Admit: 2022-11-07 | Discharge: 2022-11-07 | Disposition: A | Discharge status: Home / Self Care | Payer: Medicare Other | Source: Ambulatory Visit | Attending: Cardiology | Admitting: Cardiology

## 2022-11-07 ENCOUNTER — Encounter (HOSPITAL_COMMUNITY): Payer: Self-pay

## 2022-11-07 DIAGNOSIS — R072 Precordial pain: Secondary | ICD-10-CM

## 2022-11-07 DIAGNOSIS — R0609 Other forms of dyspnea: Secondary | ICD-10-CM | POA: Diagnosis not present

## 2022-11-07 HISTORY — PX: CT CTA CORONARY W/O CA SCORE W/CM &/OR WO/CM: HXRAD788

## 2022-11-07 MED ORDER — NITROGLYCERIN 0.4 MG SL SUBL
SUBLINGUAL_TABLET | SUBLINGUAL | Status: AC
  Filled 2022-11-07: qty 2

## 2022-11-07 MED ORDER — METOPROLOL TARTRATE 5 MG/5ML IV SOLN
INTRAVENOUS | Status: AC
  Filled 2022-11-07: qty 5

## 2022-11-07 MED ORDER — IOHEXOL 350 MG/ML SOLN
95.0000 mL | Freq: Once | INTRAVENOUS | Status: AC | PRN
  Administered 2022-11-07: 95 mL via INTRAVENOUS

## 2022-11-07 MED ORDER — NITROGLYCERIN 0.4 MG SL SUBL
0.8000 mg | SUBLINGUAL_TABLET | Freq: Once | SUBLINGUAL | Status: AC
  Administered 2022-11-07: 0.8 mg via SUBLINGUAL

## 2022-11-22 ENCOUNTER — Ambulatory Visit (HOSPITAL_COMMUNITY): Payer: Medicare Other | Attending: Cardiology

## 2022-11-22 DIAGNOSIS — R072 Precordial pain: Secondary | ICD-10-CM

## 2022-11-22 DIAGNOSIS — R0609 Other forms of dyspnea: Secondary | ICD-10-CM | POA: Diagnosis present

## 2022-11-22 HISTORY — PX: TRANSTHORACIC ECHOCARDIOGRAM: SHX275

## 2022-11-22 LAB — ECHOCARDIOGRAM COMPLETE
Area-P 1/2: 4.15 cm2
S' Lateral: 2.4 cm

## 2022-11-24 ENCOUNTER — Encounter: Payer: Self-pay | Admitting: Cardiology

## 2022-11-27 NOTE — Telephone Encounter (Signed)
The CT scan suggested maybe that there was a potential patent foramen ovale, the echocardiogram did not comment on this but did not see any evidence of blood flow going from 1 side to the other side indicating that there is not a significant change in flow. . This would argue against there being a true "hole"  Probably not unreasonable to stay on low-dose aspirin as long as you are not having bleeding issues.  Does not increase the chances of heart attack, but if there is a potential for blood to go cross over, that does slightly increase risk of stroke albeit not very significant.  The fact that there was not evidence of color flow going across the whole would suggest that there was not a significant "hole".    Glenetta Hew, MD    Glenetta Hew, MD

## 2022-12-02 NOTE — Assessment & Plan Note (Signed)
[id number], stage IIIA -diagnosed in 09/2018, treated with concurrent chemoRT on 11/04/18 - 12/15/18.  -PET 04/14/19 showed complete radiographic response -last scope with Dr. Marcello Moores on 02/06/22 was benign. -surveillance CT A/P 04/26/22 showed NED. I do not plan for further scans. --she is now over 4 years out from diagnosis. I reviewed that her risk of recurrence is much less now.

## 2022-12-03 ENCOUNTER — Encounter: Payer: Self-pay | Admitting: Hematology

## 2022-12-03 ENCOUNTER — Inpatient Hospital Stay (HOSPITAL_BASED_OUTPATIENT_CLINIC_OR_DEPARTMENT_OTHER): Payer: Medicare Other | Admitting: Hematology

## 2022-12-03 ENCOUNTER — Inpatient Hospital Stay: Payer: Medicare Other | Attending: Hematology

## 2022-12-03 VITALS — BP 136/69 | HR 74 | Temp 97.9°F | Resp 18 | Ht 61.0 in | Wt 160.8 lb

## 2022-12-03 DIAGNOSIS — Z923 Personal history of irradiation: Secondary | ICD-10-CM | POA: Insufficient documentation

## 2022-12-03 DIAGNOSIS — C210 Malignant neoplasm of anus, unspecified: Secondary | ICD-10-CM | POA: Diagnosis not present

## 2022-12-03 DIAGNOSIS — Z79899 Other long term (current) drug therapy: Secondary | ICD-10-CM | POA: Diagnosis not present

## 2022-12-03 DIAGNOSIS — K529 Noninfective gastroenteritis and colitis, unspecified: Secondary | ICD-10-CM | POA: Diagnosis not present

## 2022-12-03 LAB — CBC WITH DIFFERENTIAL (CANCER CENTER ONLY)
Abs Immature Granulocytes: 0.01 10*3/uL (ref 0.00–0.07)
Basophils Absolute: 0 10*3/uL (ref 0.0–0.1)
Basophils Relative: 1 %
Eosinophils Absolute: 0.2 10*3/uL (ref 0.0–0.5)
Eosinophils Relative: 2 %
HCT: 38.6 % (ref 36.0–46.0)
Hemoglobin: 13.3 g/dL (ref 12.0–15.0)
Immature Granulocytes: 0 %
Lymphocytes Relative: 21 %
Lymphs Abs: 1.4 10*3/uL (ref 0.7–4.0)
MCH: 33.8 pg (ref 26.0–34.0)
MCHC: 34.5 g/dL (ref 30.0–36.0)
MCV: 98 fL (ref 80.0–100.0)
Monocytes Absolute: 0.7 10*3/uL (ref 0.1–1.0)
Monocytes Relative: 10 %
Neutro Abs: 4.1 10*3/uL (ref 1.7–7.7)
Neutrophils Relative %: 66 %
Platelet Count: 248 10*3/uL (ref 150–400)
RBC: 3.94 MIL/uL (ref 3.87–5.11)
RDW: 12.7 % (ref 11.5–15.5)
WBC Count: 6.3 10*3/uL (ref 4.0–10.5)
nRBC: 0 % (ref 0.0–0.2)

## 2022-12-03 LAB — CMP (CANCER CENTER ONLY)
ALT: 42 U/L (ref 0–44)
AST: 29 U/L (ref 15–41)
Albumin: 4.1 g/dL (ref 3.5–5.0)
Alkaline Phosphatase: 95 U/L (ref 38–126)
Anion gap: 6 (ref 5–15)
BUN: 17 mg/dL (ref 8–23)
CO2: 28 mmol/L (ref 22–32)
Calcium: 9.7 mg/dL (ref 8.9–10.3)
Chloride: 106 mmol/L (ref 98–111)
Creatinine: 0.65 mg/dL (ref 0.44–1.00)
GFR, Estimated: >60 mL/min (ref >60)
Glucose, Bld: 141 mg/dL — ABNORMAL HIGH (ref 70–99)
Potassium: 4.1 mmol/L (ref 3.5–5.1)
Sodium: 140 mmol/L (ref 135–145)
Total Bilirubin: 0.6 mg/dL (ref 0.3–1.2)
Total Protein: 7.2 g/dL (ref 6.5–8.1)

## 2022-12-03 NOTE — Progress Notes (Signed)
Angelica Wright   [phone] [phone]   Clinic Follow up Note   Patient Care Team: Shirline Frees, MD as PCP - General (Family Medicine) Leonie Man, MD as PCP - Cardiology (Cardiology) Leighton Ruff, MD as Consulting Physician (General Surgery) Salvadore Dom, MD as Consulting Physician (Obstetrics and Gynecology) Truitt Merle, MD as Consulting Physician (Hematology) Leonie Man, MD as Consulting Physician (Cardiology)  Date of Service:  12/03/2022  CHIEF COMPLAINT: f/u of anal cancer   CURRENT THERAPY: Surveillance   ASSESSMENT:   Angelica Wright is a 67 y.o. female with anal cancer   Anal cancer ([place]) cT2N1bM0, stage IIIA -diagnosed in 09/2018, treated with concurrent chemoRT on 11/04/18 - 12/15/18.  -PET 04/14/19 showed complete radiographic response -last scope with Dr. Marcello Moores on 02/06/22 was benign. -surveillance CT A/P 04/26/22 showed NED. I do not plan for further scans. --she is now over 4 years out from diagnosis. I reviewed that her risk of recurrence is much less now.  -She is currently doing very well, exam including rectal exam was unremarkable, no clinical concern for recurrence.  PLAN: - reviewed labs - reviewed CT of heart - f/u in one year (last visit)    SUMMARY OF ONCOLOGIC HISTORY: Oncology History  Anal cancer ([place])  03/19/2018 Genetic Testing   Genetic testing performed through [facility]'s Common Hereditary Cancers Panel reported out on 03/10/2018 showed no pathogenic mutations. The Common Hereditary Cancer Panel offered by [facility] includes sequencing and/or deletion duplication testing of the following 47 genes: APC, ATM, AXIN2, BARD1, BMPR1A, BRCA1, BRCA2, BRIP1, CDH1, CDKN2A (p14ARF), CDKN2A (p16INK4a), CKD4, CHEK2, CTNNA1, DICER1, EPCAM (Deletion/duplication testing only), GREM1 (promoter region deletion/duplication testing only), KIT, MEN1, MLH1, MSH2, MSH3, MSH6, MUTYH, NBN, NF1, NHTL1, PALB2, PDGFRA, PMS2,  POLD1, POLE, PTEN, RAD50, RAD51C, RAD51D, SDHB, SDHC, SDHD, SMAD4, SMARCA4. STK11, TP53, TSC1, TSC2, and VHL.  The following genes were evaluated for sequence changes only: SDHA and HOXB13 c.251G>A variant only.   A variant of uncertain significance (VUS) in a gene called PMS2 was also noted. c.1399G>A (p.Val467Ile)   08/14/2018 Imaging   MRI PELVIS W WO Contrast 08/14/18 IMPRESSION: 1. Centrally necrotic 1.8 cm enlarged lymph node versus nonspecific soft tissue mass in the left hemipelvis adjacent to the posterior pelvic sidewall. Further evaluation with biopsy and/or PET-CT is recommended.   09/17/2018 Pathology Results   Diagnosis 09/17/18 Lymph node, needle/core biopsy, left pelvic - SQUAMOUS CELL CARCINOMA. - SEE COMMENT. Microscopic Comment   10/09/2018 PET scan   PET 10/09/18  IMPRESSION: 1. 17 mm left pelvic sidewall lesion is markedly hypermetabolic compatible with known malignancy. 2. No additional definite hypermetabolic disease identified in the neck, chest, abdomen, or pelvis. 3. Relatively diffuse FDG accumulation in the colon without focal increased uptake above background colonic levels in the region of the anus. 4. Focal FDG accumulation identified along the anterior left femoral neck and in the lumbosacral junction. No underlying bone lesions evident at these locations on CT images. Indeterminate finding although metastatic disease cannot be excluded.    10/13/2018 Initial Biopsy   Diagnosis 10/13/18 Anus, biopsy - SQUAMOUS CELL CARCINOMA. - SEE COMMENT.   10/14/2018 Initial Diagnosis   Anal cancer ([place])   10/14/2018 Cancer Staging   Staging form: Anus, AJCC 8th Edition - Clinical stage from 10/14/2018: Stage IIIA (cT2, cN1b, cM0) - Signed by Truitt Merle, MD on 10/14/2018   11/03/2018 - 12/15/2018 Radiation Therapy   Concurrent chemoRt with Dr. Lisbeth Renshaw for 5-6 weeks starting 11/03/18. Plans  to complete on 12/15/18   11/03/2018 - 12/15/2018 Chemotherapy    Concurrent chemoRt with Mitomycin and 5-FU on week 1 and week 5 starting 11/03/18. Week 5 chemo on 12/01/18   04/14/2019 PET scan   PET 04/14/19  IMPRESSION: 1. The hypermetabolic 17 mm left pelvic sidewall lesion seen on the previous study has resolved in the interval. No new or progressive hypermetabolic disease in the neck, chest, abdomen, or pelvis on today's study. 2. Hepatic steatosis. 3. Colonic diverticulosis without diverticulitis.     04/14/2020 Imaging   CT CAP w contrast IMPRESSION: 1. Thickening and stranding about the anus, see PET-CT for further detail. No mass lesion grossly on this evaluation. 2. No evidence of metastatic disease in the chest, abdomen or pelvis. 3. Marked hepatic steatosis. Signs of recanalization of the umbilical vein, correlate with any clinical signs of liver disease. This is indicative of portal hypertension but is seen in the absence of splenomegaly. Note that the portal vein is patent into the liver. 4. Recanalized umbilical vein with varix at the umbilicus. 5. Aortic atherosclerosis.   Aortic Atherosclerosis (ICD10-I70.0).   05/01/2021 Imaging   CT A/P w contrast  IMPRESSION: 1. No evidence of anal carcinoma recurrence or metastasis. 2. No abdominopelvic lymphadenopathy. 3. Scattered colonic diverticulosis without diverticulitis.      INTERVAL HISTORY:  Angelica Wright is here for a follow up of anal cancer  She was last seen by Dr. Truitt Merle on 05/04/2022. She presents to the clinic with  her husband, Pt states that all is well, no pain or other concerns.  She has mild chronic diarrhea, occasional hematochezia from hemorrhoids.    All other systems were reviewed with the patient and are negative.  MEDICAL HISTORY:  Past Medical History:  Diagnosis Date   Aortic atherosclerosis ([place])    Essential hypertension    Hyperlipidemia associated with type 2 diabetes mellitus ([place])    Hyperparathyroidism ([place])    resolved after surgery    Nonalcoholic steatohepatitis ([facility])    Osteoarthritis    Hands, feet, knees   Osteoporosis    of the spine   Rectocele    Sensorineural hearing loss (SNHL) of both ears    Squamous cell cancer, anus ([place])    (Dr. Ermelinda Das -oncology) -radiation and chemo therapy   TMJ syndrome    Type 2 Diabetes ([place]) 11/2013   controlled with diet/exercise    SURGICAL HISTORY: Past Surgical History:  Procedure Laterality Date   LYMPH NODE BIOPSY     PARATHYROIDECTOMY  2000   THERAPEUTIC ABORTION     x 2   VULVA /PERINEUM BIOPSY  2012    I have reviewed the social history and family history with the patient and they are unchanged from previous note.  ALLERGIES:  is allergic to polymyxin b-trimethoprim and sulfa antibiotics.  MEDICATIONS:  Current Outpatient Medications  Medication Sig Dispense Refill   aspirin 81 MG chewable tablet Chew 81 mg by mouth daily.     atenolol (TENORMIN) 100 MG tablet Take 100 mg by mouth daily.     atorvastatin (LIPITOR) 20 MG tablet Take 1 tablet by mouth daily.     Blood Glucose Monitoring Suppl (ACCU-CHEK GUIDE) w/Device KIT daily.     clobetasol ointment (TEMOVATE) 0.05 % Apply a pea sized amount topically 1-2 x a week and  BID x 1-2 weeks prn flare 30 g 1   hydrochlorothiazide (HYDRODIURIL) 12.5 MG tablet Take 12.5 mg by mouth daily.  Lancets (ACCU-CHEK MULTICLIX) lancets See admin instructions.     Multiple Vitamin (MULTIVITAMIN ADULT PO) Take 1 tablet by mouth daily.     valACYclovir (VALTREX) 500 MG tablet TAKE ONE TAB BY MOUTH TWICE A DAY FOR 3 DAYS WITH FLARES. 30 tablet 1   vitamin C (ASCORBIC ACID) 500 MG tablet Take 500 mg by mouth daily.     YUVAFEM 10 MCG TABS vaginal tablet PLACE ONE TABLET VAGINALLY 2 TIMES A WEEK 24 tablet 2   No current facility-administered medications for this visit.    PHYSICAL EXAMINATION: ECOG PERFORMANCE STATUS: 0 - Asymptomatic  Vitals:   12/03/22 0852  BP: 136/69  Pulse: 74  Resp: 18  Temp: 97.9 F (36.6 C)   SpO2: 97%   Wt Readings from Last 3 Encounters:  12/03/22 160 lb 12.8 oz (72.9 kg)  10/22/22 160 lb (72.6 kg)  05/04/22 165 lb 11.2 oz (75.2 kg)    LYMPH:(-)  no palpable lymphadenopathy in the cervical, axillary {or inguinal) ABDOMEN:(-)abdomen soft, non-tender and normal bowel sounds Musculoskeletal:no cyanosis of digits and no clubbing  NEURO: alert & oriented x 3 with fluent speech, no focal motor/sensory deficits RECTAL: External Hemorid present, no bleeding, no palpable mass.  LABORATORY DATA:  I have reviewed the data as listed    Latest Ref Rng & Units 12/03/2022    8:04 AM 04/26/2022    7:52 AM 11/16/2021    7:53 AM  CBC  WBC 4.0 - 10.5 K/uL 6.3  6.4  5.8   Hemoglobin 12.0 - 15.0 g/dL 13.3  13.7  13.2   Hematocrit 36.0 - 46.0 % 38.6  38.9  38.7   Platelets 150 - 400 K/uL 248  256  248         Latest Ref Rng & Units 12/03/2022    8:04 AM 10/22/2022   10:31 AM 04/26/2022    7:52 AM  CMP  Glucose 70 - 99 mg/dL 141  119  187   BUN 8 - 23 mg/dL '17  13  15   '$ Creatinine 0.44 - 1.00 mg/dL 0.65  0.64  0.78   Sodium 135 - 145 mmol/L 140  143  142   Potassium 3.5 - 5.1 mmol/L 4.1  4.2  4.3   Chloride 98 - 111 mmol/L 106  105  106   CO2 22 - 32 mmol/L '28  23  27   '$ Calcium 8.9 - 10.3 mg/dL 9.7  9.5  10.1   Total Protein 6.5 - 8.1 g/dL 7.2   7.7   Total Bilirubin 0.3 - 1.2 mg/dL 0.6   0.6   Alkaline Phos 38 - 126 U/L 95   89   AST 15 - 41 U/L 29   38   ALT 0 - 44 U/L 42   58       RADIOGRAPHIC STUDIES: I have personally reviewed the radiological images as listed and agreed with the findings in the report. No results found.    No orders of the defined types were placed in this encounter.  All questions were answered. The patient knows to call the clinic with any problems, questions or concerns. No barriers to learning was detected. The total time spent in the appointment was 20 minutes.     Truitt Merle, MD 12/03/2022   I, Maurine Simmering, CMA, am acting as scribe for  Truitt Merle, MD.   I have reviewed the above documentation for accuracy and completeness, and I agree with the above.

## 2022-12-28 ENCOUNTER — Ambulatory Visit: Payer: Medicare Other | Admitting: Obstetrics and Gynecology

## 2023-01-10 NOTE — Progress Notes (Signed)
67 y.o. [id number] Married Other or two or more races Not Hispanic or Latino female here for breast and pelvic exam.    H/O HSV, hasn't had an outbreak in years.    H/O anal cancer, treated with radiation in 2020. She has resultant severe vaginal atrophy, was treated with vaginal estrogen and vaginal dilators. The dilators didn't help, so she stopped using them. Not sexually active too painful.   H/O lichen sclerosis, bothers her on occasion, use steroid ointment prn. Flares a couple of times a year.    FH of breast and uterine cancer, genetic testing negative, she did have a VUS in PMS2 TC risk under 20%   H/O osteoporosis, previously treated with fosamax for a few weeks. She didn't tolerate it. Recent normal CBC and CMP. We discussed treatment options last year.   Patient's last menstrual period was 11/05/2002 (approximate).          Sexually active: No.  The current method of family planning is post menopausal status.    Exercising: No.  The patient does not participate in regular exercise at present. Smoker:  no  Health Maintenance: Pap:   12/22/20 WNL, neg hpv; 01/16/2018 WNL NEG HPV,  04-28-14 WNL    History of abnormal Pap:  no MMG:  06/20/22 density B Bi-rads 1 neg  BMD:   12/19/21 osteoporosis T score -2.8  Colonoscopy: never  TDaP:  12/22/20  Gardasil: n/a   reports that she has never smoked. She has never used smokeless tobacco. She reports current alcohol use of about 7.0 standard drinks of alcohol per week. She reports that she does not use drugs. Currently no ETOH. Retired, may go back part time. 2 grown sons, 5 grand children, all local   Past Medical History:  Diagnosis Date   Aortic atherosclerosis ([place])    Essential hypertension    Hyperlipidemia associated with type 2 diabetes mellitus ([place])    Hyperparathyroidism ([place])    resolved after surgery   Nonalcoholic steatohepatitis ([facility])    Osteoarthritis    Hands, feet, knees   Osteoporosis    of the spine   Rectocele     Sensorineural hearing loss (SNHL) of both ears    Squamous cell cancer, anus ([place])    (Dr. Ermelinda Das -oncology) -radiation and chemo therapy   TMJ syndrome    Type 2 Diabetes ([place]) 11/2013   controlled with diet/exercise    Past Surgical History:  Procedure Laterality Date   LYMPH NODE BIOPSY     PARATHYROIDECTOMY  2000   THERAPEUTIC ABORTION     x 2   VULVA /PERINEUM BIOPSY  2012    Current Outpatient Medications  Medication Sig Dispense Refill   aspirin 81 MG chewable tablet Chew 81 mg by mouth daily.     atenolol (TENORMIN) 100 MG tablet Take 100 mg by mouth daily.     atorvastatin (LIPITOR) 20 MG tablet Take 1 tablet by mouth daily.     Blood Glucose Monitoring Suppl (ACCU-CHEK GUIDE) w/Device KIT daily.     clobetasol ointment (TEMOVATE) 0.05 % Apply a pea sized amount topically 1-2 x a week and  BID x 1-2 weeks prn flare 30 g 1   hydrochlorothiazide (HYDRODIURIL) 12.5 MG tablet Take 12.5 mg by mouth daily.      Lancets (ACCU-CHEK MULTICLIX) lancets See admin instructions.     Multiple Vitamin (MULTIVITAMIN ADULT PO) Take 1 tablet by mouth daily.     valACYclovir (VALTREX) 500 MG tablet TAKE ONE TAB BY  MOUTH TWICE A DAY FOR 3 DAYS WITH FLARES. 30 tablet 1   vitamin C (ASCORBIC ACID) 500 MG tablet Take 500 mg by mouth daily.     YUVAFEM 10 MCG TABS vaginal tablet PLACE ONE TABLET VAGINALLY 2 TIMES A WEEK 24 tablet 2   No current facility-administered medications for this visit.    Family History  Problem Relation Age of Onset   Breast cancer Mother 79       lumpectomy, x2 in other breast in late 80's   Endometrial cancer Mother 61   Breast cancer Maternal Aunt        dx in early 56's, x2 in late 80's   Breast cancer Paternal Aunt 74   Breast cancer Paternal Aunt 51   Cirrhosis Father        ETOH related   Liver cancer Father    Lung cancer Father        tobacco user   Liver disease Father    Stroke Maternal Grandmother    Stroke Maternal Grandfather    Emphysema  Paternal Grandmother    Diabetes Paternal Grandfather    Breast cancer Cousin 69    Review of Systems  Exam:   LMP 11/05/2002 (Approximate)   Weight change: @WEIGHTCHANGE @ Height:      Ht Readings from Last 3 Encounters:  12/03/22 5\' 1"  (1.549 m)  10/22/22 5\' 1"  (1.549 m)  12/27/21 5\' 1"  (1.549 m)    General appearance: alert, cooperative and appears stated age Head: Normocephalic, without obvious abnormality, atraumatic Neck: no adenopathy, supple, symmetrical, trachea midline and thyroid normal to inspection and palpation Breasts: normal appearance, no masses or tenderness Abdomen: soft, non-tender; non distended,  no masses,  no organomegaly Extremities: extremities normal, atraumatic, no cyanosis or edema Skin: Skin color, texture, turgor normal. No rashes or lesions Lymph nodes: Cervical, supraclavicular, and axillary nodes normal. No abnormal inguinal nodes palpated Neurologic: Grossly normal   Pelvic: External genitalia:  mild whitening on the right vulva. Small superficial black spot on the left vulva, easily removed with rubbing with a finger ? Clot. She will monitor and call if it returns.               Urethra:  normal appearing urethra with no masses, tenderness or lesions              Bartholins and Skenes: normal                 Vagina: atrophic appearing vagina with normal color and discharge, no lesions. Able to easily insert an adolescent speculum and 1 finger. Small grade 1-2 rectocele.               Cervix: no lesions and stenotic               Bimanual Exam:  Uterus:  normal size, contour, position, consistency, mobility, non-tender and anteverted              Adnexa: no mass, fullness, tenderness               Rectovaginal: Confirms               Anus:  normal sphincter tone, no lesions  Gae Dry, CMA chaperoned for the exam.  1. Encounter for breast and pelvic examination Discussed breast self exam Discussed calcium and vit D intake Mammogram due in  8/24 Labs with primary  2. Lichen sclerosus Intermittent flares - clobetasol ointment (TEMOVATE) 0.05 %; Apply a  pea sized amount topically 1-2 x a week and  BID x 1-2 weeks prn flare  Dispense: 30 g; Refill: 1  3. Vaginal atrophy - Estradiol 10 MCG TABS vaginal tablet; Place 1 tablet (10 mcg total) vaginally 2 (two) times a week.  Dispense: 24 tablet; Refill: 3  4. History of herpes genitalis - valACYclovir (VALTREX) 500 MG tablet; TAKE ONE TAB BY MOUTH TWICE A DAY FOR 3 DAYS WITH FLARES.  Dispense: 30 tablet; Refill: 1  5. Osteoporosis without current pathological fracture, unspecified osteoporosis type She is due for a DEXA in 2/25 She has decided she wants to start Prolia, she has been counseled, aware she will need to stay on Prolia Recent normal CBC and CMP - VITAMIN D 25 Hydroxy (Vit-D Deficiency, Fractures)  6. Vitamin D deficiency - VITAMIN D 25 Hydroxy (Vit-D Deficiency, Fractures)  7. Colon cancer screening - Ambulatory referral to Gastroenterology

## 2023-01-22 ENCOUNTER — Encounter: Payer: Self-pay | Admitting: Obstetrics and Gynecology

## 2023-01-22 ENCOUNTER — Ambulatory Visit (INDEPENDENT_AMBULATORY_CARE_PROVIDER_SITE_OTHER): Payer: Medicare Other | Admitting: Obstetrics and Gynecology

## 2023-01-22 VITALS — BP 132/78 | HR 68 | Ht 62.0 in | Wt 162.8 lb

## 2023-01-22 DIAGNOSIS — L900 Lichen sclerosus et atrophicus: Secondary | ICD-10-CM

## 2023-01-22 DIAGNOSIS — Z1211 Encounter for screening for malignant neoplasm of colon: Secondary | ICD-10-CM

## 2023-01-22 DIAGNOSIS — Z01419 Encounter for gynecological examination (general) (routine) without abnormal findings: Secondary | ICD-10-CM

## 2023-01-22 DIAGNOSIS — Z9189 Other specified personal risk factors, not elsewhere classified: Secondary | ICD-10-CM

## 2023-01-22 DIAGNOSIS — Z8619 Personal history of other infectious and parasitic diseases: Secondary | ICD-10-CM | POA: Diagnosis not present

## 2023-01-22 DIAGNOSIS — N952 Postmenopausal atrophic vaginitis: Secondary | ICD-10-CM | POA: Diagnosis not present

## 2023-01-22 DIAGNOSIS — M810 Age-related osteoporosis without current pathological fracture: Secondary | ICD-10-CM | POA: Diagnosis not present

## 2023-01-22 DIAGNOSIS — E559 Vitamin D deficiency, unspecified: Secondary | ICD-10-CM

## 2023-01-22 MED ORDER — CLOBETASOL PROPIONATE 0.05 % EX OINT: TOPICAL_OINTMENT | CUTANEOUS | 1 refills | Status: DC

## 2023-01-22 MED ORDER — VALACYCLOVIR HCL 500 MG PO TABS: ORAL_TABLET | ORAL | 1 refills | Status: AC

## 2023-01-22 MED ORDER — ESTRADIOL 10 MCG VA TABS: 1.0000 | ORAL_TABLET | VAGINAL | 3 refills | Status: DC

## 2023-01-22 NOTE — H&P (Signed)
TOTAL KNEE ADMISSION H&P  Patient is being admitted for right total knee arthroplasty.  Subjective:  Chief Complaint: Right knee pain.  HPI: Angelica Wright, 67 y.o. female has a history of pain and functional disability in the right knee due to arthritis and has failed non-surgical conservative treatments for greater than 12 weeks to include NSAID's and/or analgesics, corticosteriod injections, and activity modification. Onset of symptoms was gradual, starting  several  years ago with gradually worsening course since that time. The patient noted no past surgery on the right knee.  Patient currently rates pain in the right knee at 9 out of 10 with activity. Patient has night pain, worsening of pain with activity and weight bearing, pain with passive range of motion, and crepitus. Patient has evidence of  bone-on-bone arthritis in the medial and patellofemoral compartments of the bilateral knees with large osteophyte formation and slight varus bilaterally  by imaging studies. There is no active infection.  Patient Active Problem List   Diagnosis Date Noted   Precordial pain 10/22/2022   Bilateral sensorineural hearing loss 12/27/2021   Bilateral tinnitus 12/27/2021   Flatulence, eructation and gas pain 12/27/2021   Gastroesophageal reflux disease 12/27/2021   Hyperlipidemia due to type 2 diabetes mellitus ([place]) 12/27/2021   Obesity 12/27/2021   DOE (dyspnea on exertion) 03/09/2021   Age-related osteoporosis without current pathological fracture 12/22/2020   Anxiety 12/22/2020   Hardening of the aorta (main artery of the heart) ([place]) 12/22/2020   Hypoparathyroidism ([place]) 12/22/2020   Insomnia 12/22/2020   Localized edema 12/22/2020   Microscopic hematuria 12/22/2020   Osteoarthritis of knee 12/22/2020   Localized osteoporosis without current pathological fracture 12/22/2020   Pure hypercholesterolemia 12/22/2020   Restless legs 12/22/2020   Type 2 diabetes mellitus without complication  ([place]) [id number]   Vitamin D deficiency 12/22/2020   Fatty (change of) liver, not elsewhere classified 04/18/2020   Anal cancer ([place]) 10/14/2018   Genetic testing 03/19/2018   Family history of breast cancer    Family history of uterine cancer    Osteopenia 07/06/2015   Essential hypertension 07/06/2015   Atrophic vaginitis 99991111   Lichen sclerosus et atrophicus of the vulva 07/06/2015    Past Medical History:  Diagnosis Date   Aortic atherosclerosis ([place])    Essential hypertension    Hyperlipidemia associated with type 2 diabetes mellitus ([place])    Hyperparathyroidism ([place])    resolved after surgery   Nonalcoholic steatohepatitis ([facility])    Osteoarthritis    Hands, feet, knees   Osteoporosis    of the spine   Rectocele    Sensorineural hearing loss (SNHL) of both ears    Squamous cell cancer, anus ([place])    (Dr. Ermelinda Das -oncology) -radiation and chemo therapy   TMJ syndrome    Type 2 Diabetes ([place]) 11/2013   controlled with diet/exercise    Past Surgical History:  Procedure Laterality Date   LYMPH NODE BIOPSY     PARATHYROIDECTOMY  2000   THERAPEUTIC ABORTION     x 2   VULVA /PERINEUM BIOPSY  2012    Prior to Admission medications   Medication Sig Start Date End Date Taking? Authorizing Provider  aspirin 81 MG chewable tablet Chew 81 mg by mouth daily. 02/04/20   [provider]  atenolol (TENORMIN) 100 MG tablet Take 100 mg by mouth daily. 09/04/19   [provider]  atorvastatin (LIPITOR) 20 MG tablet Take 1 tablet by mouth daily. 04/30/20   [provider]  Blood Glucose Monitoring Suppl (ACCU-CHEK GUIDE) w/Device KIT daily. 05/07/18   [provider]  clobetasol ointment (TEMOVATE) 0.05 % Apply a pea sized amount topically 1-2 x a week and  BID x 1-2 weeks prn flare 01/22/23   Salvadore Dom, MD  Estradiol 10 MCG TABS vaginal tablet Place 1 tablet (10 mcg total) vaginally 2 (two) times a week. 01/24/23   Salvadore Dom,  MD  hydrochlorothiazide (HYDRODIURIL) 12.5 MG tablet Take 12.5 mg by mouth daily.  06/23/16   [provider]  Lancets (ACCU-CHEK MULTICLIX) lancets See admin instructions. 05/09/18   [provider]  Multiple Vitamin (MULTIVITAMIN ADULT PO) Take 1 tablet by mouth daily.    [provider]  valACYclovir (VALTREX) 500 MG tablet TAKE ONE TAB BY MOUTH TWICE A DAY FOR 3 DAYS WITH FLARES. 01/22/23   Salvadore Dom, MD  vitamin C (ASCORBIC ACID) 500 MG tablet Take 500 mg by mouth daily.    [provider]    Allergies  Allergen Reactions   Polymyxin B-Trimethoprim Itching   Sulfa Antibiotics Nausea And Vomiting    Social History   Socioeconomic History   Marital status: Married    Spouse name: Not on file   Number of children: 2   Years of education: Not on file   Highest education level: Some college, no degree  Occupational History   Occupation: Therapist, sports: [employer]  Tobacco Use   Smoking status: Never   Smokeless tobacco: Never  Vaping Use   Vaping Use: Never used  Substance and Sexual Activity   Alcohol use: Yes    Alcohol/week: 7.0 standard drinks of alcohol    Types: 7 Glasses of wine per week   Drug use: No   Sexual activity: Yes    Partners: Male    Birth control/protection: Other-see comments    Comment: vasectomy-husband >5 sexual partners  Other Topics Concern   Not on file  Social History Narrative   Does not exercise routinely because of bone-on-bone osteoarthritis pains in her knees.-Tries to walk much as she can.   [employer]   Social Determinants of Radio broadcast assistant Strain: Not on file  Food Insecurity: Not on file  Transportation Needs: No Transportation Needs (10/23/2018)   PRAPARE - Hydrologist (Medical): No    Lack of Transportation (Non-Medical): No  Physical Activity: Not on file  Stress: Not on file  Social Connections: Not on file  Intimate  Partner Violence: Not on file    Tobacco Use: Low Risk  (01/22/2023)   Patient History    Smoking Tobacco Use: Never    Smokeless Tobacco Use: Never    Passive Exposure: Not on file   Social History   Substance and Sexual Activity  Alcohol Use Yes   Alcohol/week: 7.0 standard drinks of alcohol   Types: 7 Glasses of wine per week    Family History  Problem Relation Age of Onset   Breast cancer Mother 84       lumpectomy, x2 in other breast in late 80's   Endometrial cancer Mother 51   Breast cancer Maternal Aunt        dx in early 31's, x2 in late 80's   Breast cancer Paternal Aunt 33   Breast cancer Paternal Aunt 72   Cirrhosis Father        ETOH related   Liver cancer Father    Lung  cancer Father        tobacco user   Liver disease Father    Stroke Maternal Grandmother    Stroke Maternal Grandfather    Emphysema Paternal Grandmother    Diabetes Paternal Grandfather    Breast cancer Cousin 62    Review of Systems  Constitutional:  Negative for chills and fever.  HENT:  Negative for congestion, sore throat and tinnitus.   Eyes:  Negative for double vision, photophobia and pain.  Respiratory:  Negative for cough, shortness of breath and wheezing.   Cardiovascular:  Negative for chest pain, palpitations and orthopnea.  Gastrointestinal:  Negative for heartburn, nausea and vomiting.  Genitourinary:  Negative for dysuria, frequency and urgency.  Musculoskeletal:  Positive for joint pain.  Neurological:  Negative for dizziness, weakness and headaches.    Objective:  Physical Exam: Well nourished and well developed.  General: Alert and oriented x3, cooperative and pleasant, no acute distress.  Head: normocephalic, atraumatic, neck supple.  Eyes: EOMI.  Musculoskeletal:  Right Knee Exam:  Slight varus deformity.  No effusion present. No swelling present.  The range of motion is: 0 to 125 degrees.  Moderate crepitus on range of motion of the knee.  Positive  medial joint line tenderness.  No lateral joint line tenderness.  The knee is stable.  Calves soft and nontender. Motor function intact in LE. Strength 5/5 LE bilaterally. Neuro: Distal pulses 2+. Sensation to light touch intact in LE.   Imaging Review Plain radiographs demonstrate severe degenerative joint disease of the right knee. The overall alignment is neutral. The bone quality appears to be adequate for age and reported activity level.  Assessment/Plan:  End stage arthritis, right knee   The patient history, physical examination, clinical judgment of the provider and imaging studies are consistent with end stage degenerative joint disease of the right knee and total knee arthroplasty is deemed medically necessary. The treatment options including medical management, injection therapy arthroscopy and arthroplasty were discussed at length. The risks and benefits of total knee arthroplasty were presented and reviewed. The risks due to aseptic loosening, infection, stiffness, patella tracking problems, thromboembolic complications and other imponderables were discussed. The patient acknowledged the explanation, agreed to proceed with the plan and consent was signed. Patient is being admitted for inpatient treatment for surgery, pain control, PT, OT, prophylactic antibiotics, VTE prophylaxis, progressive ambulation and ADLs and discharge planning. The patient is planning to be discharged  home .   Patient's anticipated LOS is less than 2 midnights, meeting these requirements: - Younger than 28 - Lives within 1 hour of care - Has a competent adult at home to recover with post-op recover - NO history of  - Chronic pain requiring opioids  - Diabetes  - Coronary Artery Disease  - Heart failure  - Heart attack  - Stroke  - DVT/VTE  - Cardiac arrhythmia  - Respiratory Failure/COPD  - Renal failure  - Anemia  - Advanced Liver disease  Therapy Plans: Outpatient therapy at  [facility] Disposition: Home with husband Planned DVT Prophylaxis: Xarelto 10 mg QD (hx anal CA) DME Needed: None PCP: Azalia Bilis, MD (clearance received) Cardiologist: Glenetta Hew, MD (appt 4/10) TXA: IV Allergies: Sulfa Anesthesia Concerns: None BMI: 30.6 Last HgbA1c: 7.0% [facility]  Other: - Rechecking A1c with preop labs   - Patient was instructed on what medications to stop prior to surgery. - Follow-up visit in 2 weeks with Dr. Wynelle Link - Begin physical therapy following surgery -  Pre-operative lab work as pre-surgical testing - Prescriptions will be provided in hospital at time of discharge  Theresa Duty, PA-C Orthopedic Surgery [facility]

## 2023-01-23 LAB — VITAMIN D 25 HYDROXY (VIT D DEFICIENCY, FRACTURES): Vit D, 25-Hydroxy: 34 ng/mL (ref 30–100)

## 2023-01-24 ENCOUNTER — Telehealth: Payer: Self-pay | Admitting: *Deleted

## 2023-01-24 DIAGNOSIS — M810 Age-related osteoporosis without current pathological fracture: Secondary | ICD-10-CM

## 2023-01-24 NOTE — Telephone Encounter (Signed)
Deductible: $240 ($240 met)   OOP MAX n/a  Annual exam: 01-22-23 JJ   Calcium    9.7         Date: 12-03-22  Upcoming dental procedures: No   Is Prior Authorization needed: No   Pt estimated Cost: $0       Coverage Details: Prolia and administration will be subjecto to a $240.00 deductible ($240.00 met) and 20.0% coinsurance. For the secondary MD purchase option, this plan is a Medicare Supplement Plan G and it covers the Medicare Part B co-insurance and 100% of the excess charges. This plan does not cover the Medicare Part B deductible.

## 2023-01-24 NOTE — Telephone Encounter (Signed)
-----   Message from Ramond Craver, [place] sent at 01/22/2023  9:19 AM EDT -----  ----- Message ----- From: Salvadore Dom, MD Sent: 01/22/2023   8:50 AM EDT To: [facility] Triage  She wants to start prolia for osteoporosis. Having surgery in 4/24, would like to start in 5/24. Thanks, Sharee Pimple

## 2023-01-24 NOTE — Telephone Encounter (Signed)
Primary and secondary insurance submitted to [employer]. Will await Summary of Benefits.

## 2023-01-24 NOTE — Telephone Encounter (Signed)
Call to patient. Prolia summary of benefits reviewed with patient and she verbalized understanding. Patient aware of $0 OOP. Patient states she is having knee surgery in mid April and would like to schedule prolia injection for the end of May. Patient scheduled for 04-04-23 at [place]. Patient agreeable to date and time of appointment.

## 2023-01-30 ENCOUNTER — Other Ambulatory Visit: Payer: Self-pay | Admitting: Obstetrics and Gynecology

## 2023-01-30 DIAGNOSIS — Z8619 Personal history of other infectious and parasitic diseases: Secondary | ICD-10-CM

## 2023-01-30 NOTE — Telephone Encounter (Signed)
Rx just sent on 01/22/2023 for #30 w/ 1 refill.  Request states requesting rx for 90 day supply. However, pt only takes PRN so 90 day not exactly appropriate, correct?  Please confirm. Thanks.   Rx is pend.

## 2023-01-30 NOTE — Telephone Encounter (Signed)
90 day supply isn't appropriate, it is a prn medication.

## 2023-02-05 NOTE — Patient Instructions (Signed)
SURGICAL WAITING ROOM VISITATION  Patients having surgery or a procedure may have no more than 2 support people in the waiting area - these visitors may rotate.    Children under the age of 38 must have an adult with them who is not the patient.  Due to an increase in RSV and influenza rates and associated hospitalizations, children ages 30 and under may not visit patients in [facility].  If the patient needs to stay at the hospital during part of their recovery, the visitor guidelines for inpatient rooms apply. Pre-op nurse will coordinate an appropriate time for 1 support person to accompany patient in pre-op.  This support person may not rotate.    Please refer to the [facility] website for the visitor guidelines for Inpatients (after your surgery is over and you are in a regular room).       Your procedure is scheduled on:  02/18/2023    Report to [facility] Main Entrance    Report to admitting at   1130 AM   Call this number if you have problems the morning of surgery [phone]   Do not eat food :After Midnight.   After Midnight you may have the following liquids until ____ 1045__ Am  DAY OF SURGERY  Water Non-Citrus Juices (without pulp, NO RED-Apple, White grape, White cranberry) Black Coffee (NO MILK/CREAM OR CREAMERS, sugar ok)  Clear Tea (NO MILK/CREAM OR CREAMERS, sugar ok) regular and decaf                             Plain Jell-O (NO RED)                                           Fruit ices (not with fruit pulp, NO RED)                                     Popsicles (NO RED)                                                               Sports drinks like Gatorade (NO RED)                    The day of surgery:  Drink ONE (1) Pre-Surgery Clear Ensure or G2 at AM the morning of surgery. Drink in one sitting. Do not sip.  This drink was given to you during your hospital  pre-op appointment visit. Nothing else to drink after completing the   Pre-Surgery Clear Ensure or G2.          If you have questions, please contact your surgeon's office.       Oral Hygiene is also important to reduce your risk of infection.                                    Remember - BRUSH YOUR TEETH THE MORNING OF SURGERY WITH YOUR REGULAR TOOTHPASTE  DENTURES WILL  BE REMOVED PRIOR TO SURGERY PLEASE DO NOT APPLY "Poly grip" OR ADHESIVES!!!   Do NOT smoke after Midnight   Take these medicines the morning of surgery with A SIP OF WATER:  none   DO NOT TAKE ANY ORAL DIABETIC MEDICATIONS DAY OF YOUR SURGERY  Bring CPAP mask and tubing day of surgery.                              You may not have any metal on your body including hair pins, jewelry, and body piercing             Do not wear make-up, lotions, powders, perfumes/cologne, or deodorant  Do not wear nail polish including gel and S&S, artificial/acrylic nails, or any other type of covering on natural nails including finger and toenails. If you have artificial nails, gel coating, etc. that needs to be removed by a nail salon please have this removed prior to surgery or surgery may need to be canceled/ delayed if the surgeon/ anesthesia feels like they are unable to be safely monitored.   Do not shave  48 hours prior to surgery.               Men may shave face and neck.   Do not bring valuables to the hospital. [place].   Contacts, glasses, dentures or bridgework may not be worn into surgery.   Bring small overnight bag day of surgery.   DO NOT [place]. PHARMACY WILL DISPENSE MEDICATIONS LISTED ON YOUR MEDICATION LIST TO YOU DURING YOUR ADMISSION [place]!    Patients discharged on the day of surgery will not be allowed to drive home.  Someone NEEDS to stay with you for the first 24 hours after anesthesia.   Special Instructions: Bring a copy of your healthcare power of attorney and living  will documents the day of surgery if you haven't scanned them before.              Please read over the following fact sheets you were given: IF [place] [phone]   If you received a COVID test during your pre-op visit  it is requested that you wear a mask when out in public, stay away from anyone that may not be feeling well and notify your surgeon if you develop symptoms. If you test positive for Covid or have been in contact with anyone that has tested positive in the last 10 days please notify you surgeon.

## 2023-02-06 ENCOUNTER — Encounter (HOSPITAL_COMMUNITY)
Admission: RE | Admit: 2023-02-06 | Discharge: 2023-02-06 | Disposition: A | Discharge status: Home / Self Care | Payer: Medicare Other | Source: Ambulatory Visit | Attending: Orthopedic Surgery | Admitting: Orthopedic Surgery

## 2023-02-06 ENCOUNTER — Encounter (HOSPITAL_COMMUNITY): Payer: Self-pay

## 2023-02-06 ENCOUNTER — Other Ambulatory Visit: Payer: Self-pay

## 2023-02-06 VITALS — BP 130/68 | HR 70 | Temp 98.2°F | Resp 16 | Ht 62.0 in | Wt 168.0 lb

## 2023-02-06 DIAGNOSIS — E139 Other specified diabetes mellitus without complications: Secondary | ICD-10-CM | POA: Insufficient documentation

## 2023-02-06 DIAGNOSIS — Z01812 Encounter for preprocedural laboratory examination: Secondary | ICD-10-CM | POA: Diagnosis present

## 2023-02-06 DIAGNOSIS — Z01818 Encounter for other preprocedural examination: Secondary | ICD-10-CM

## 2023-02-06 HISTORY — DX: Depression, unspecified: F32.A

## 2023-02-06 HISTORY — DX: Personal history of urinary calculi: Z87.442

## 2023-02-06 HISTORY — DX: Peripheral vascular disease, unspecified: I73.9

## 2023-02-06 HISTORY — DX: Anxiety disorder, unspecified: F41.9

## 2023-02-06 HISTORY — DX: Gastro-esophageal reflux disease without esophagitis: K21.9

## 2023-02-06 LAB — BASIC METABOLIC PANEL
Anion gap: 8 (ref 5–15)
BUN: 22 mg/dL (ref 8–23)
CO2: 24 mmol/L (ref 22–32)
Calcium: 9.4 mg/dL (ref 8.9–10.3)
Chloride: 108 mmol/L (ref 98–111)
Creatinine, Ser: 0.71 mg/dL (ref 0.44–1.00)
GFR, Estimated: >60 mL/min (ref >60)
Glucose, Bld: 168 mg/dL — ABNORMAL HIGH (ref 70–99)
Potassium: 4.2 mmol/L (ref 3.5–5.1)
Sodium: 140 mmol/L (ref 135–145)

## 2023-02-06 LAB — CBC
HCT: 40.3 % (ref 36.0–46.0)
Hemoglobin: 13.6 g/dL (ref 12.0–15.0)
MCH: 33.5 pg (ref 26.0–34.0)
MCHC: 33.7 g/dL (ref 30.0–36.0)
MCV: 99.3 fL (ref 80.0–100.0)
Platelets: 256 10*3/uL (ref 150–400)
RBC: 4.06 MIL/uL (ref 3.87–5.11)
RDW: 12.7 % (ref 11.5–15.5)
WBC: 5.5 10*3/uL (ref 4.0–10.5)
nRBC: 0 % (ref 0.0–0.2)

## 2023-02-06 LAB — SURGICAL PCR SCREEN
MRSA, PCR: NEGATIVE
Staphylococcus aureus: NEGATIVE

## 2023-02-06 LAB — GLUCOSE, CAPILLARY: Glucose-Capillary: 153 mg/dL — ABNORMAL HIGH (ref 70–99)

## 2023-02-06 NOTE — Progress Notes (Addendum)
Anesthesia Review:  PCP: DR  Johny Blamer  clearance 11/21/22 LOV 08/24/22 on chart  Cardiologist : DR Bryan Lemma  LOV 10/22/22  DR Coralie Carpen - follows for PVD per pt  Chest x-ray : 10/01/22- 2 view  EKG : 10/22/22  Echo : 11/22/22  CT cors- 11/17/22  Stress test: Cardiac Cath :  Activity level: can do a flight of stairs without difficulty  Sleep Study/ CPAP : none  Fasting Blood Sugar :      / Checks Blood Sugar -- times a day:   Blood Thinner/ Instructions /Last Dose: ASA / Instructions/ Last Dose :    81 mg aspirin    DM- type 2- on no meds checks glucose once daily at home  Hgba1c- 7.2 routed to DR Aluisio on 02/08/23.    Pt has hx of anal cancer - has some problems with continence of bowel . Pwears a pad.  PT would like disposable brief with pad on DOS .      Clearance DR Onalee Hua harding 02/13/23 on chart

## 2023-02-07 LAB — HEMOGLOBIN A1C
Hgb A1c MFr Bld: 7.2 % — ABNORMAL HIGH (ref 4.8–5.6)
Mean Plasma Glucose: 160 mg/dL

## 2023-02-10 ENCOUNTER — Encounter: Payer: Self-pay | Admitting: Obstetrics and Gynecology

## 2023-02-13 ENCOUNTER — Ambulatory Visit: Payer: Medicare Other | Attending: Cardiology | Admitting: Cardiology

## 2023-02-13 ENCOUNTER — Encounter: Payer: Self-pay | Admitting: Cardiology

## 2023-02-13 VITALS — BP 112/78 | HR 65 | Ht 62.0 in | Wt 164.0 lb

## 2023-02-13 DIAGNOSIS — R600 Localized edema: Secondary | ICD-10-CM | POA: Diagnosis present

## 2023-02-13 DIAGNOSIS — E119 Type 2 diabetes mellitus without complications: Secondary | ICD-10-CM

## 2023-02-13 DIAGNOSIS — Z01810 Encounter for preprocedural cardiovascular examination: Secondary | ICD-10-CM | POA: Diagnosis present

## 2023-02-13 DIAGNOSIS — R0609 Other forms of dyspnea: Secondary | ICD-10-CM | POA: Diagnosis present

## 2023-02-13 DIAGNOSIS — E7800 Pure hypercholesterolemia, unspecified: Secondary | ICD-10-CM | POA: Diagnosis present

## 2023-02-13 DIAGNOSIS — R002 Palpitations: Secondary | ICD-10-CM

## 2023-02-13 DIAGNOSIS — E1169 Type 2 diabetes mellitus with other specified complication: Secondary | ICD-10-CM

## 2023-02-13 DIAGNOSIS — E785 Hyperlipidemia, unspecified: Secondary | ICD-10-CM

## 2023-02-13 DIAGNOSIS — I10 Essential (primary) hypertension: Secondary | ICD-10-CM | POA: Diagnosis present

## 2023-02-13 NOTE — Assessment & Plan Note (Signed)
Well controlled today. -Continue atenolol -Continuee HCTZ

## 2023-02-13 NOTE — Assessment & Plan Note (Signed)
Last A1c at 7.2. Currently diet controlled-reluctant to start medication. PCP to monitor and reassess.

## 2023-02-13 NOTE — Assessment & Plan Note (Signed)
Last lipid panel with LDL 51. Does have HTN, T2DM and obesity. CT Coronary calcium score was 16-minimal CAD. -Continue atorvastatin 20 -ASA 81 mg (OK to hold for total knee surgery)

## 2023-02-13 NOTE — Assessment & Plan Note (Signed)
CT Coronary score of 16, minimal nonobstructive CAD. CT had mentioned PFO however Echo without any intra-atrial shunting. Most likely in the setting of deconditioning related to knee pain and decreased activity.

## 2023-02-13 NOTE — Patient Instructions (Signed)
Medication Instructions:   No changes  *If you need a refill on your cardiac medications before your next appointment, please call your pharmacy*   Lab Work: Not needed    Testing/Procedures: Not needed   Follow-Up: At [facility], you and your health needs are our priority.  As part of our continuing mission to provide you with exceptional heart care, we have created designated Provider Care Teams.  These Care Teams include your primary Cardiologist (physician) and Advanced Practice Providers (APPs -  Physician Assistants and Nurse Practitioners) who all work together to provide you with the care you need, when you need it.     Your next appointment:   12 month(s)  The format for your next appointment:   In Person  Provider:   Bryan Lemma, MD    Other Instructions    You are cleared for knee surgery

## 2023-02-13 NOTE — Assessment & Plan Note (Signed)
Patient says she has been noticing some bilateral ankle edema at end of day. Most likely related to venous insufficiency in the setting of prolonged sitting due to knee injury/not elevating legs. No CHF sxs and echo reassuring. -compression socks as needed for travel -frequent elevation

## 2023-02-13 NOTE — Assessment & Plan Note (Signed)
Intermittent and infrequent in the setting of anxiety related to upcoming surgery. Lasts for seconds and resolves on its own.

## 2023-02-13 NOTE — Progress Notes (Signed)
Primary Care Provider: Johny Blamer, MD [facility] Cardiologist: Bryan Lemma, MD Electrophysiologist: None  Clinic Note: Chief Complaint  Patient presents with   Follow-up    Echo and Coronary CTA results; preop cardiovascular evaluation.   Chest Pain    Not really getting into this now.  Coronary CTA was relatively normal.   Shortness of Breath    Exertional dyspnea also notably improved. Normal echo and Coronary CTA.   ===================================  ASSESSMENT/PLAN   Problem List Items Addressed This Visit       Cardiology Problems   Pure hypercholesterolemia (Chronic)    Last lipid panel with LDL 51. Does have HTN, T2DM and obesity. CT Coronary calcium score was 16-minimal CAD. -Continue atorvastatin 20 -ASA 81 mg (OK to hold for total knee surgery)       Hyperlipidemia due to type 2 diabetes mellitus (Chronic)    Excellent lipid control on current dose of atorvastatin. A1c 7.2, defer to PCP-she has been elected to start medication. She wants to try diet and exercise.  Hopefully when her knee surgery she will be able to be more active and help her blood sugar levels.      Essential hypertension - Primary (Chronic)    Well controlled today. -Continue atenolol -Continuee HCTZ        Other   Type 2 diabetes mellitus without complication (Chronic)    Last A1c at 7.2. Currently diet controlled-reluctant to start medication. PCP to monitor and reassess.      Preoperative cardiovascular examination    Low risk noncardiac surgery-orthopedic surgery on the knee, no active angina or heart failure symptoms with negative ischemic/CHF evaluation, no history of Carotid Artery Disease/CVA or TIA, normal kidney function.  She has diabetes but is not on insulin.  ->    Revised Cardiac Risk Index: LOW RISK PATIENT FOR LOW RISK SURGERY.   No further cardiac evaluation needed.  She is already on high-dose beta-blocker which provides cardiovascular benefit  since she has been on it for an extended period time.       Localized edema    Patient says she has been noticing some bilateral ankle edema at end of day. Most likely related to venous insufficiency in the setting of prolonged sitting due to knee injury/not elevating legs. No CHF sxs and echo reassuring. -compression socks as needed for travel -frequent elevation      Fluttering sensation of heart    Intermittent and infrequent in the setting of anxiety related to upcoming surgery. Lasts for seconds and resolves on its own.       DOE (dyspnea on exertion)    CT Coronary score of 16, minimal nonobstructive CAD. CT had mentioned PFO however Echo without any intra-atrial shunting. Most likely in the setting of deconditioning related to knee pain and decreased activity.      A&P as per Resident - Attending Discussion -- agree with Resident's A&P.  ATTENDING ATTESTATION  I have seen, examined and evaluated the patient along with the Resident Physician in clinic today.  I personally performed my own interview & exanimation.  After reviewing all the available data and chart, we discussed the patients laboratory, study & physical findings as well as symptoms in detail. I agree with her findings, examination as well as impression recommendations as per our discussion.    Attending adjustments int the full clinic noted annotated in italics.   Angelica Wright return for follow-up evaluation to discuss results of her Echo and Coronary  CTA both of which are quite reassuring.  She has upcoming right TKA surgery next week which she should do fine with. Her blood pressure is well-controlled on atenolol at 100 mg daily along with HCTZ 12.5 mg. Her lipids were well-controlled as of last check in October on 20 mg atorvastatin.  No change. She is on low-dose aspirin which is fine to hold preoperatively.  Honestly with a Coronary Calcium Score level already is, and there is questionable benefit of her being  on it.  Her exertional dyspnea is likely noncardiac in nature based on r the elatively normal echocardiogram findings and minimal CAD on coronary CT scan.  Thankfully, she does not have any more chest pain.  I suspect that the exertional dyspnea is related to deconditioning.  Still has a fluttering sensation in the chest, probably more social anxiety.  She is already on very high-dose atenolol.  Bryan Lemma, MD  ===================================  HPI:    Angelica Wright is a 67 y.o. female with a PMH of T2DM, HTN, HLD who presents today for 67-month follow up to discuss results of Coronary CTA and echocardiogram.  Angelica Wright was last seen on 10/12/2022 for precordial pain and dyspnea on exertion.  Patient noted continued exertional dyspnea with some chest tightness, probably exacerbated by an ongoing cold.  Had not been going to the gym of late.  Patient described having some tightness in her upper chest and into the throat, if she would continue to exert dyspnea.  This can occur up to 3 times a week, but her exertional dyspnea will occur much more currently-just going up with a complaint of steps.  Rare intermittent palpitations. ->  Was not currently on medication for diabetes, and had recently started statin for hyperlipidemia.  BP well-controlled.  Was concerned because of upcoming knee surgery. => Coronary CTA and 2D Echo ordered to assess symptoms of exertional dyspnea and chest discomfort as well as preop assessment.  Recent Hospitalizations: none  Reviewed  CV studies:    The following studies were reviewed today: (if available, images/films reviewed: From Epic Chart or Care Everywhere) Studies personally reviewed, summarized below Coronary CT 11/07/2022 : Coronary Calcium Score-16 (low risk).  Normal RCA dominance.  Minimal nonobstructive disease with minimal calcified plaque in LM and proximal LAD (0-24%).  Normal Ascending Aorta and PA.  CAD-RADS 1. Minimal non-obstructive CAD  (0-24%). Consider non-atherosclerotic causes of chest pain. Consider preventive therapy and risk factor modification.  Echo 11/22/2022: Normal LV size and function.  EF 65 to 70%.  No RWMA.  No LVH.  Normal diastolic parameters.  Normal RV as well as RVP and RAP.  Mild LA dilation otherwise normal RA.  Mild MAC.  Normal LV.  Interval History:   Angelica Wright presents today after follow up of her Echo and Coronary CTA (essentially as part of preoperative cardiovascular evaluation for TKA / total knee replacement on 4/15).  She saus that she still has some exertional dyspnea that is exacerbated after going up 2 sets of staircases at home. She has been deconditioned due to her knee pain and has not been doing much activity. Mostly sits during the day. She denies any chest pain or tightness at rest or when walking. She also mentions that she has noticed at the end of the day she has mild bilateral ankle swelling. Denies any smothering sensation or dyspnea when laying flat in bed. She mentions she sometimes feels a fluttering in her chest (maybe once every 1-2  weeks) for the past months. This lasts for seconds and goes away on its own. She attests this to anxiety over her surgery that is coming up. Denies any chest pain or shortness of breath during these episodes.   CV Review of Symptoms (Summary): no chest pain does have some dyspnea on exertion.   Cardiovascular ROS: positive for - dyspnea on exertion, edema, palpitations, and notes ongoing deconditioning and lack of exercise due to her knee pain.  Limited by her knee. negative for - orthopnea, paroxysmal nocturnal dyspnea, shortness of breath, or lightheadedness dizziness or wooziness.  Syncope or near syncope, TIA or amaurosis fugax, claudication    REVIEWED OF SYSTEMS   Review of Systems  Constitutional:  Positive for malaise/fatigue (Exercise intolerance due to deconditioning related to knee pain). Negative for weight loss.  Respiratory:   Negative for cough and shortness of breath.   Cardiovascular:  Negative for chest pain, palpitations, orthopnea and claudication.  Gastrointestinal:  Negative for blood in stool and melena.  Genitourinary:  Negative for frequency and hematuria.  Musculoskeletal:  Positive for joint pain (R Knee).  Neurological:  Negative for dizziness and focal weakness.  Psychiatric/Behavioral:  Negative for memory loss. The patient is nervous/anxious.     I have reviewed and (if needed) personally updated the patient's problem list, medications, allergies, past medical and surgical history, social and family history.   PAST MEDICAL HISTORY   Past Medical History:  Diagnosis Date   Anxiety    Aortic atherosclerosis    Depression    Essential hypertension    GERD (gastroesophageal reflux disease)    History of kidney stones    Hyperlipidemia associated with type 2 diabetes mellitus    Hyperparathyroidism    resolved after surgery   Osteoarthritis    Hands, feet, knees   Osteoporosis    of the spine   Peripheral vascular disease    Rectocele    Sensorineural hearing loss (SNHL) of both ears    Squamous cell cancer, anus    (Dr. Herold Harms -oncology) -radiation and chemo therapy   TMJ syndrome    Type 2 Diabetes (HCC) 11/2013   controlled with diet/exercise    PAST SURGICAL HISTORY   Past Surgical History:  Procedure Laterality Date   CT CTA CORONARY W/O CA SCORE W/CM &/OR WO/CM  11/07/2022   Coronary Calcium Score-16 (low risk).  Normal RCA dominance.  Minimal nonobstructive disease with minimal calcified plaque in LM and proximal LAD (0-24%).  Normal Ascending Aorta and PA.   LYMPH NODE BIOPSY     PARATHYROIDECTOMY  2000   THERAPEUTIC ABORTION     x 2   TRANSTHORACIC ECHOCARDIOGRAM  11/22/2022   Normal LV size and function.  EF 65 to 70%.  No RWMA.  No LVH.  Normal diastolic parameters.  Normal RV as well as RVP and RAP.  Mild LA dilation otherwise normal RA.  Mild MAC.  Normal LV.    VULVA /PERINEUM BIOPSY  2012    Immunization History  Administered Date(s) Administered   Influenza Split 08/18/2009, 08/29/2010, 08/29/2011, 08/07/2013, 08/12/2014   Influenza, Quadrivalent, Recombinant, Inj, Pf 07/31/2019, 08/05/2020, 08/14/2021   Influenza,inj,Quad PF,6+ Mos 08/16/2016, 08/21/2017, 07/29/2018   Influenza-Unspecified 08/25/2015, 08/14/2016, 08/05/2020   PFIZER Comirnaty(Gray Top)Covid-19 Tri-Sucrose Vaccine 01/22/2020, 02/12/2020, 10/09/2020   PFIZER(Purple Top)SARS-COV-2 Vaccination 01/22/2020, 02/12/2020, 10/09/2020   Td 01/04/1998   Tdap 05/27/2008, 11/05/2010, 12/22/2020    MEDICATIONS/ALLERGIES   Current Meds  Medication Sig   acetaminophen (TYLENOL) 500 MG tablet Take 1,000  mg by mouth every 6 (six) hours as needed for moderate pain or headache.   Ascorbic Acid (VITAMIN C PLUS WILD ROSE HIPS) 500 MG CHEW Chew 500 mg by mouth at bedtime.   aspirin EC 81 MG tablet Take 81 mg by mouth at bedtime. Swallow whole.   atenolol (TENORMIN) 100 MG tablet Take 100 mg by mouth at bedtime.   atorvastatin (LIPITOR) 20 MG tablet Take 20 mg by mouth at bedtime.   Blood Glucose Monitoring Suppl (ACCU-CHEK GUIDE) w/Device KIT daily.   clobetasol ointment (TEMOVATE) 0.05 % Apply a pea sized amount topically 1-2 x a week and  BID x 1-2 weeks prn flare   Estradiol 10 MCG TABS vaginal tablet Place 1 tablet (10 mcg total) vaginally 2 (two) times a week.   hydrochlorothiazide (HYDRODIURIL) 12.5 MG tablet Take 12.5 mg by mouth daily.    Lancets (ACCU-CHEK MULTICLIX) lancets See admin instructions.   Multiple Vitamins-Iron (MULTIVITAMIN/IRON PO) Take 1 tablet by mouth daily.   valACYclovir (VALTREX) 500 MG tablet TAKE ONE TAB BY MOUTH TWICE A DAY FOR 3 DAYS WITH FLARES.    Allergies  Allergen Reactions   Polymyxin B-Trimethoprim Itching   Sulfa Antibiotics Nausea And Vomiting    SOCIAL HISTORY/FAMILY HISTORY   Reviewed in Epic:  Pertinent findings:  Social History   Tobacco  Use   Smoking status: Never   Smokeless tobacco: Never  Vaping Use   Vaping Use: Never used  Substance Use Topics   Alcohol use: Yes    Alcohol/week: 7.0 standard drinks of alcohol    Types: 7 Glasses of wine per week    Comment: occas   Drug use: No   Social History   Social History Narrative   Does not exercise routinely because of bone-on-bone osteoarthritis pains in her knees.-Tries to walk much as she can.   [employer]    OBJCTIVE -PE, EKG, labs   Wt Readings from Last 3 Encounters:  02/13/23 164 lb (74.4 kg)  02/06/23 168 lb (76.2 kg)  01/22/23 162 lb 12.8 oz (73.8 kg)    Physical Exam: BP 112/78 (BP Location: Left Arm, Patient Position: Sitting, Cuff Size: Normal)   Pulse 65   Ht 5\' 2"  (1.575 m)   Wt 164 lb (74.4 kg)   LMP 11/05/2002 (Approximate)   SpO2 95%   BMI 30.00 kg/m  Physical Exam Constitutional:      General: She is not in acute distress.    Appearance: Normal appearance. She is not toxic-appearing or diaphoretic.  HENT:     Head: Normocephalic and atraumatic.  Eyes:     Conjunctiva/sclera: Conjunctivae normal.  Cardiovascular:     Rate and Rhythm: Normal rate and regular rhythm.     Pulses: Normal pulses.     Heart sounds: Normal heart sounds. No murmur heard.    No friction rub. No gallop.  Pulmonary:     Effort: Pulmonary effort is normal. No respiratory distress.     Breath sounds: Normal breath sounds. No wheezing, rhonchi or rales.  Musculoskeletal:        General: No swelling or tenderness.     Right lower leg: No edema.     Left lower leg: No edema.  Skin:    General: Skin is warm and dry.     Capillary Refill: Capillary refill takes less than 2 seconds.  Neurological:     General: No focal deficit present.     Mental Status: She is alert.    Attending  PE: General appearance: alert, cooperative, appears stated age, no distress, and mildly obese Neck: no carotid bruit, no JVD, and supple, symmetrical, trachea  midline Lungs: clear to auscultation bilaterally, normal percussion bilaterally, and nonlabored, good air movement Heart: regular rate and rhythm, S1, S2 normal, no murmur, click, rub or gallop and normal apical impulse Abdomen: soft, non-tender; bowel sounds normal; no masses,  no organomegaly Extremities: extremities normal, atraumatic, no cyanosis or edema Pulses: 2+ and symmetric Neurologic: Grossly normal    Adult ECG Report  Not performed today  Recent Labs:    08/24/2022: TC 110, TG 134, HDL 36, LDL 51.  No results found for: "CHOL", "HDL", "LDLCALC", "LDLDIRECT", "TRIG", "CHOLHDL" Lab Results  Component Value Date   CREATININE 0.71 02/06/2023   BUN 22 02/06/2023   NA 140 02/06/2023   K 4.2 02/06/2023   CL 108 02/06/2023   CO2 24 02/06/2023      Latest Ref Rng & Units 02/06/2023    8:32 AM 12/03/2022    8:04 AM 04/26/2022    7:52 AM  CBC  WBC 4.0 - 10.5 K/uL 5.5  6.3  6.4   Hemoglobin 12.0 - 15.0 g/dL 16.113.6  09.613.3  04.513.7   Hematocrit 36.0 - 46.0 % 40.3  38.6  38.9   Platelets 150 - 400 K/uL 256  248  256     Lab Results  Component Value Date   HGBA1C 7.2 (H) 02/06/2023   Lab Results  Component Value Date   TSH 1.67 06/29/2019    ================================================== The resident physician and I spent a total of 25 minutes with the patient spent in direct patient consultation.  Additional time spent with chart review  / charting (studies, outside notes, etc): 20 min Total Time: 45 min  Current medicines are reviewed at length with the patient today.  (+/- concerns) n/a  Notice: This dictation was prepared with Dragon dictation along with smart phrase technology. Any transcriptional errors that result from this process are unintentional and may not be corrected upon review.  Studies Ordered:   No orders of the defined types were placed in this encounter.  No orders of the defined types were placed in this encounter.   Patient Instructions /  Medication Changes & Studies & Tests Ordered   Patient Instructions  Medication Instructions:   No changes  *If you need a refill on your cardiac medications before your next appointment, please call your pharmacy*   Lab Work: Not needed    Testing/Procedures: Not needed   Follow-Up: At [facility], you and your health needs are our priority.  As part of our continuing mission to provide you with exceptional heart care, we have created designated Provider Care Teams.  These Care Teams include your primary Cardiologist (physician) and Advanced Practice Providers (APPs -  Physician Assistants and Nurse Practitioners) who all work together to provide you with the care you need, when you need it.     Your next appointment:   12 month(s)  The format for your next appointment:   In Person  Provider:   Bryan Lemmaavid Harding, MD    Other Instructions    You are cleared for knee surgery   Signed: Levin ErpMayuri Jaiyden Laur, MD   Family Medicine PGY-2  Co-Signed: Marykay Lexavid W. Harding, MD, MS Bryan Lemmaavid Harding, M.D., M.S. Interventional Cardiologist  [facility]  Pager # [phone] Phone # [phone] [address] [place], [place] [address]

## 2023-02-15 NOTE — Anesthesia Preprocedure Evaluation (Addendum)
Anesthesia Evaluation  Patient identified by MRN, date of birth, ID band Patient awake    Reviewed: Allergy & Precautions, H&P , NPO status , Patient's Chart, lab work & pertinent test results  Airway Mallampati: II  TM Distance: >3 FB Neck ROM: Full    Dental no notable dental hx.    Pulmonary neg pulmonary ROS   Pulmonary exam normal breath sounds clear to auscultation       Cardiovascular hypertension, + Peripheral Vascular Disease  Normal cardiovascular exam Rhythm:Regular Rate:Normal     Neuro/Psych negative neurological ROS  negative psych ROS   GI/Hepatic negative GI ROS, Neg liver ROS,,,  Endo/Other  diabetes    Renal/GU negative Renal ROS  negative genitourinary   Musculoskeletal  (+) Arthritis , Osteoarthritis,    Abdominal   Peds negative pediatric ROS (+)  Hematology negative hematology ROS (+)   Anesthesia Other Findings   Reproductive/Obstetrics negative OB ROS                             Anesthesia Physical Anesthesia Plan  ASA: 3  Anesthesia Plan: Spinal   Post-op Pain Management: Regional block*   Induction: Intravenous  PONV Risk Score and Plan: 2 and Propofol infusion, Treatment may vary due to age or medical condition and Ondansetron  Airway Management Planned: Simple Face Mask  Additional Equipment:   Intra-op Plan:   Post-operative Plan:   Informed Consent: I have reviewed the patients History and Physical, chart, labs and discussed the procedure including the risks, benefits and alternatives for the proposed anesthesia with the patient or authorized representative who has indicated his/her understanding and acceptance.     Dental advisory given  Plan Discussed with: CRNA and Surgeon  Anesthesia Plan Comments: (See PAT note 01/23/2023)       Anesthesia Quick Evaluation

## 2023-02-15 NOTE — Progress Notes (Signed)
Anesthesia chart review   Case: [id number] Date/Time: 02/18/23 0700   Procedure: TOTAL KNEE ARTHROPLASTY (Right: Knee)   Anesthesia type: Choice   Pre-op diagnosis: right knee osteoarthritis   Location: [facility] ROOM 09 / [facility]   Surgeons: Ollen Gross, MD       DISCUSSION: 67 year old never smoker with history of HTN, GERD, PVD, DM, TMJ, right knee OA scheduled for above procedure 02/18/2023 with Dr. Ollen Gross.  Patient seen by cardiology 02/13/2023.  Per office visit note, "CT Coronary score of 16, minimal nonobstructive CAD. CT had mentioned PFO however Echo without any intra-atrial shunting. Most likely in the setting of deconditioning related to knee pain and decreased activity."  Per note patient is cleared for upcoming surgery.  Clearance from PCP on chart which states patient is low risk for upcoming surgery.  Anticipate pt can proceed with planned procedure barring acute status change.   VS: BP 130/68   Pulse 70   Temp 36.8 C (Oral)   Resp 16   Ht  (1.575 m)   Wt 76.2 kg   LMP 11/05/2002 (Approximate)   SpO2 98%   BMI 30.73 kg/m   PROVIDERS: Johny Blamer, MD is PCP   Bryan Lemma, MD is Cardiologist  LABS: Labs reviewed: Acceptable for surgery. (all labs ordered are listed, but only abnormal results are displayed)  Labs Reviewed  HEMOGLOBIN A1C - Abnormal; Notable for the following components:      Result Value   Hgb A1c MFr Bld 7.2 (*)    All other components within normal limits  BASIC METABOLIC PANEL - Abnormal; Notable for the following components:   Glucose, Bld 168 (*)    All other components within normal limits  GLUCOSE, CAPILLARY - Abnormal; Notable for the following components:   Glucose-Capillary 153 (*)    All other components within normal limits  SURGICAL PCR SCREEN  CBC     IMAGES:   EKG:   CV: Echo 11/22/2022 1. Left ventricular ejection fraction, by estimation, is 65 to 70%. Left  ventricular ejection fraction by 3D  volume is 68 %. The left ventricle has  normal function. The left ventricle has no regional wall motion  abnormalities. Left ventricular diastolic   parameters were normal.   2. Right ventricular systolic function is low normal. The right  ventricular size is normal. There is normal pulmonary artery systolic  pressure. The estimated right ventricular systolic pressure is 24.3 mmHg.   3. Left atrial size was mildly dilated.   4. The mitral valve is abnormal. Trivial mitral valve regurgitation.   5. The aortic valve is tricuspid. Aortic valve regurgitation is not  visualized. No aortic stenosis is present.   6. The inferior vena cava is normal in size with greater than 50%  respiratory variability, suggesting right atrial pressure of 3 mmHg.  Past Medical History:  Diagnosis Date   Anxiety    Aortic atherosclerosis    Depression    Essential hypertension    GERD (gastroesophageal reflux disease)    History of kidney stones    Hyperlipidemia associated with type 2 diabetes mellitus    Hyperparathyroidism    resolved after surgery   Osteoarthritis    Hands, feet, knees   Osteoporosis    of the spine   Peripheral vascular disease    Rectocele    Sensorineural hearing loss (SNHL) of both ears    Squamous cell cancer, anus    (Dr. Herold Harms -oncology) -radiation and chemo therapy  TMJ syndrome    Type 2 Diabetes (HCC) 11/2013   controlled with diet/exercise    Past Surgical History:  Procedure Laterality Date   LYMPH NODE BIOPSY     PARATHYROIDECTOMY  2000   THERAPEUTIC ABORTION     x 2   VULVA /PERINEUM BIOPSY  2012    MEDICATIONS:  acetaminophen (TYLENOL) 500 MG tablet   Ascorbic Acid (VITAMIN C PLUS WILD ROSE HIPS) 500 MG CHEW   aspirin EC 81 MG tablet   atenolol (TENORMIN) 100 MG tablet   atorvastatin (LIPITOR) 20 MG tablet   Blood Glucose Monitoring Suppl (ACCU-CHEK GUIDE) w/Device KIT   clobetasol ointment (TEMOVATE) 0.05 %   Estradiol 10 MCG TABS vaginal tablet    hydrochlorothiazide (HYDRODIURIL) 12.5 MG tablet   Lancets (ACCU-CHEK MULTICLIX) lancets   Multiple Vitamins-Iron (MULTIVITAMIN/IRON PO)   valACYclovir (VALTREX) 500 MG tablet   No current facility-administered medications for this encounter.      Jodell Cipro Ward, PA-C [facility] [phone]

## 2023-02-16 ENCOUNTER — Encounter: Payer: Self-pay | Admitting: Cardiology

## 2023-02-16 DIAGNOSIS — Z01810 Encounter for preprocedural cardiovascular examination: Secondary | ICD-10-CM | POA: Insufficient documentation

## 2023-02-16 NOTE — Assessment & Plan Note (Signed)
Low risk noncardiac surgery-orthopedic surgery on the knee, no active angina or heart failure symptoms with negative ischemic/CHF evaluation, no history of Carotid Artery Disease/CVA or TIA, normal kidney function.  She has diabetes but is not on insulin.  ->    Revised Cardiac Risk Index: LOW RISK PATIENT FOR LOW RISK SURGERY.   No further cardiac evaluation needed.  She is already on high-dose beta-blocker which provides cardiovascular benefit since she has been on it for an extended period time.

## 2023-02-16 NOTE — Assessment & Plan Note (Signed)
Excellent lipid control on current dose of atorvastatin. A1c 7.2, defer to PCP-she has been elected to start medication. She wants to try diet and exercise.  Hopefully when her knee surgery she will be able to be more active and help her blood sugar levels.

## 2023-02-16 NOTE — Progress Notes (Signed)
ATTENDING ATTESTATION  I have seen, examined and evaluated the patient along with the Resident Physician in clinic today.  I personally performed my own interview & exanimation.  After reviewing all the available data and chart, we discussed the patients laboratory, study & physical findings as well as symptoms in detail. I agree with her findings, examination as well as impression recommendations as per our discussion.    Attending adjustments int the full clinic noted annotated in italics.   Angelica Wright return for follow-up evaluation to discuss results of her Echo and Coronary CTA both of which are quite reassuring.  She has upcoming right TKA surgery next week which she should do fine with. Her blood pressure is well-controlled on atenolol at 100 mg daily along with HCTZ 12.5 mg. Her lipids were well-controlled as of last check in October on 20 mg atorvastatin.  No change. She is on low-dose aspirin which is fine to hold preoperatively.  Honestly with a Coronary Calcium Score level already is, and there is questionable benefit of her being on it.  Her exertional dyspnea is likely noncardiac in nature based on relatively normal echocardiogram findings and minimal CAD on coronary CT scan.  Thankfully, she does not have any more chest pain.  I suspect that the exertional dyspnea is related to deconditioning.  Still has a fluttering sensation in the chest, probably more social anxiety.  She is already on very high-dose atenolol.  PREOP CARDIOVASCULAR EVALUATION:: Low risk noncardiac surgery-orthopedic surgery on the knee, no active angina or heart failure symptoms with negative ischemic/CHF evaluation, no history of Carotid Artery Disease/CVA or TIA, normal kidney function.  She has diabetes but is not on insulin.  ->  Revised Cardiac Risk Index: LOW RISK PATIENT FOR LOW RISK SURGERY.  No further cardiac evaluation needed.  She is already on high-dose beta-blocker which provides cardiovascular  benefit since she has been on it for an extended period time.    [name], MD, MS Bryan Lemma, M.D., M.S. Interventional Cardiologist  [facility]  Pager # [phone] Phone # [phone] [address] [place], [place] [address]

## 2023-02-18 ENCOUNTER — Encounter (HOSPITAL_COMMUNITY)
Admission: RE | Disposition: A | Discharge status: Home / Self Care | Payer: Self-pay | Source: Ambulatory Visit | Attending: Orthopedic Surgery

## 2023-02-18 ENCOUNTER — Ambulatory Visit (HOSPITAL_COMMUNITY): Payer: Medicare Other | Admitting: Physician Assistant

## 2023-02-18 ENCOUNTER — Encounter (HOSPITAL_COMMUNITY): Payer: Self-pay | Admitting: Orthopedic Surgery

## 2023-02-18 ENCOUNTER — Other Ambulatory Visit: Payer: Self-pay

## 2023-02-18 ENCOUNTER — Observation Stay (HOSPITAL_COMMUNITY)
Admission: RE | Admit: 2023-02-18 | Discharge: 2023-02-19 | Disposition: A | Discharge status: Home / Self Care | Payer: Medicare Other | Source: Ambulatory Visit | Attending: Orthopedic Surgery | Admitting: Orthopedic Surgery

## 2023-02-18 ENCOUNTER — Ambulatory Visit (HOSPITAL_BASED_OUTPATIENT_CLINIC_OR_DEPARTMENT_OTHER): Payer: Medicare Other | Admitting: Physician Assistant

## 2023-02-18 DIAGNOSIS — E039 Hypothyroidism, unspecified: Secondary | ICD-10-CM | POA: Insufficient documentation

## 2023-02-18 DIAGNOSIS — Z85828 Personal history of other malignant neoplasm of skin: Secondary | ICD-10-CM | POA: Insufficient documentation

## 2023-02-18 DIAGNOSIS — E1151 Type 2 diabetes mellitus with diabetic peripheral angiopathy without gangrene: Secondary | ICD-10-CM | POA: Diagnosis not present

## 2023-02-18 DIAGNOSIS — Z79899 Other long term (current) drug therapy: Secondary | ICD-10-CM | POA: Diagnosis not present

## 2023-02-18 DIAGNOSIS — E1169 Type 2 diabetes mellitus with other specified complication: Secondary | ICD-10-CM

## 2023-02-18 DIAGNOSIS — E119 Type 2 diabetes mellitus without complications: Secondary | ICD-10-CM | POA: Diagnosis not present

## 2023-02-18 DIAGNOSIS — Z7982 Long term (current) use of aspirin: Secondary | ICD-10-CM | POA: Insufficient documentation

## 2023-02-18 DIAGNOSIS — I10 Essential (primary) hypertension: Secondary | ICD-10-CM | POA: Diagnosis not present

## 2023-02-18 DIAGNOSIS — Z01818 Encounter for other preprocedural examination: Secondary | ICD-10-CM

## 2023-02-18 DIAGNOSIS — E785 Hyperlipidemia, unspecified: Secondary | ICD-10-CM

## 2023-02-18 DIAGNOSIS — M1711 Unilateral primary osteoarthritis, right knee: Secondary | ICD-10-CM

## 2023-02-18 DIAGNOSIS — M179 Osteoarthritis of knee, unspecified: Secondary | ICD-10-CM

## 2023-02-18 HISTORY — PX: TOTAL KNEE ARTHROPLASTY: SHX125

## 2023-02-18 LAB — GLUCOSE, CAPILLARY
Glucose-Capillary: 175 mg/dL — ABNORMAL HIGH (ref 70–99)
Glucose-Capillary: 139 mg/dL — ABNORMAL HIGH (ref 70–99)
Glucose-Capillary: 165 mg/dL — ABNORMAL HIGH (ref 70–99)
Glucose-Capillary: 160 mg/dL — ABNORMAL HIGH (ref 70–99)
Glucose-Capillary: 172 mg/dL — ABNORMAL HIGH (ref 70–99)

## 2023-02-18 LAB — HEMOGLOBIN A1C
Hgb A1c MFr Bld: 6.7 % — ABNORMAL HIGH (ref 4.8–5.6)
Mean Plasma Glucose: 145.59 mg/dL

## 2023-02-18 SURGERY — ARTHROPLASTY, KNEE, TOTAL
Anesthesia: Spinal | Site: Knee | Laterality: Right

## 2023-02-18 MED ORDER — DEXAMETHASONE SODIUM PHOSPHATE 10 MG/ML IJ SOLN
8.0000 mg | Freq: Once | INTRAMUSCULAR | Status: AC
  Administered 2023-02-18 (×2): 4 mg via INTRAVENOUS

## 2023-02-18 MED ORDER — BISACODYL 10 MG RE SUPP: 10.0000 mg | Freq: Every day | RECTAL | Status: DC | PRN

## 2023-02-18 MED ORDER — SCOPOLAMINE 1 MG/3DAYS TD PT72
MEDICATED_PATCH | TRANSDERMAL | Status: AC
  Filled 2023-02-18: qty 1

## 2023-02-18 MED ORDER — DOCUSATE SODIUM 100 MG PO CAPS
100.0000 mg | ORAL_CAPSULE | Freq: Two times a day (BID) | ORAL | Status: DC
  Administered 2023-02-18 – 2023-02-19 (×3): 100 mg via ORAL
  Filled 2023-02-18 (×3): qty 1

## 2023-02-18 MED ORDER — PHENOL 1.4 % MT LIQD: 1.0000 | OROMUCOSAL | Status: DC | PRN

## 2023-02-18 MED ORDER — SODIUM CHLORIDE (PF) 0.9 % IJ SOLN
INTRAMUSCULAR | Status: AC
  Filled 2023-02-18: qty 50

## 2023-02-18 MED ORDER — CEFAZOLIN SODIUM-DEXTROSE 2-4 GM/100ML-% IV SOLN
2.0000 g | INTRAVENOUS | Status: AC
  Administered 2023-02-18: 2 g via INTRAVENOUS
  Filled 2023-02-18: qty 100

## 2023-02-18 MED ORDER — STERILE WATER FOR IRRIGATION IR SOLN
Status: DC | PRN
  Administered 2023-02-18 (×2): 1000 mL

## 2023-02-18 MED ORDER — PROPOFOL 1000 MG/100ML IV EMUL
INTRAVENOUS | Status: AC
  Filled 2023-02-18: qty 100

## 2023-02-18 MED ORDER — LACTATED RINGERS IV SOLN: INTRAVENOUS | Status: DC

## 2023-02-18 MED ORDER — OXYCODONE HCL 5 MG/5ML PO SOLN: 5.0000 mg | Freq: Once | ORAL | Status: DC | PRN

## 2023-02-18 MED ORDER — POVIDONE-IODINE 10 % EX SWAB: 2.0000 | Freq: Once | CUTANEOUS | Status: DC

## 2023-02-18 MED ORDER — POLYETHYLENE GLYCOL 3350 17 G PO PACK: 17.0000 g | PACK | Freq: Every day | ORAL | Status: DC | PRN

## 2023-02-18 MED ORDER — SODIUM CHLORIDE 0.9 % IR SOLN
Status: DC | PRN
  Administered 2023-02-18: 1000 mL

## 2023-02-18 MED ORDER — MIDAZOLAM HCL 2 MG/2ML IJ SOLN
INTRAMUSCULAR | Status: AC
  Filled 2023-02-18: qty 2

## 2023-02-18 MED ORDER — ACETAMINOPHEN 10 MG/ML IV SOLN
1000.0000 mg | Freq: Four times a day (QID) | INTRAVENOUS | Status: DC
  Administered 2023-02-18: 1000 mg via INTRAVENOUS
  Filled 2023-02-18: qty 100

## 2023-02-18 MED ORDER — PHENYLEPHRINE 80 MCG/ML (10ML) SYRINGE FOR IV PUSH (FOR BLOOD PRESSURE SUPPORT)
PREFILLED_SYRINGE | INTRAVENOUS | Status: AC
  Filled 2023-02-18: qty 10

## 2023-02-18 MED ORDER — ORAL CARE MOUTH RINSE: 15.0000 mL | Freq: Once | OROMUCOSAL | Status: AC

## 2023-02-18 MED ORDER — ONDANSETRON HCL 4 MG/2ML IJ SOLN
INTRAMUSCULAR | Status: DC | PRN
  Administered 2023-02-18: 4 mg via INTRAVENOUS

## 2023-02-18 MED ORDER — ONDANSETRON HCL 4 MG/2ML IJ SOLN: 4.0000 mg | Freq: Once | INTRAMUSCULAR | Status: DC | PRN

## 2023-02-18 MED ORDER — INSULIN ASPART 100 UNIT/ML IJ SOLN: 0.0000 [IU] | Freq: Every day | INTRAMUSCULAR | Status: DC

## 2023-02-18 MED ORDER — SODIUM CHLORIDE 0.9 % IV SOLN: INTRAVENOUS | Status: DC

## 2023-02-18 MED ORDER — FENTANYL CITRATE (PF) 100 MCG/2ML IJ SOLN
INTRAMUSCULAR | Status: DC | PRN
  Administered 2023-02-18 (×2): 25 ug via INTRAVENOUS
  Administered 2023-02-18: 50 ug via INTRAVENOUS

## 2023-02-18 MED ORDER — ACETAMINOPHEN 500 MG PO TABS
1000.0000 mg | ORAL_TABLET | Freq: Four times a day (QID) | ORAL | Status: AC
  Administered 2023-02-18 – 2023-02-19 (×4): 1000 mg via ORAL
  Filled 2023-02-18 (×4): qty 2

## 2023-02-18 MED ORDER — METOCLOPRAMIDE HCL 5 MG PO TABS: 5.0000 mg | ORAL_TABLET | Freq: Three times a day (TID) | ORAL | Status: DC | PRN

## 2023-02-18 MED ORDER — HYDROCHLOROTHIAZIDE 12.5 MG PO TABS
12.5000 mg | ORAL_TABLET | Freq: Every day | ORAL | Status: DC
  Filled 2023-02-18: qty 1

## 2023-02-18 MED ORDER — HYDROMORPHONE HCL 1 MG/ML IJ SOLN: 0.2500 mg | INTRAMUSCULAR | Status: DC | PRN

## 2023-02-18 MED ORDER — GABAPENTIN 300 MG PO CAPS
300.0000 mg | ORAL_CAPSULE | Freq: Three times a day (TID) | ORAL | Status: DC
  Administered 2023-02-18 – 2023-02-19 (×4): 300 mg via ORAL
  Filled 2023-02-18 (×4): qty 1

## 2023-02-18 MED ORDER — ATORVASTATIN CALCIUM 20 MG PO TABS: 20.0000 mg | ORAL_TABLET | Freq: Every day | ORAL | Status: DC

## 2023-02-18 MED ORDER — PROPOFOL 500 MG/50ML IV EMUL
INTRAVENOUS | Status: DC | PRN
  Administered 2023-02-18: 50 ug/kg/min via INTRAVENOUS

## 2023-02-18 MED ORDER — PROPOFOL 500 MG/50ML IV EMUL: INTRAVENOUS | Status: DC | PRN

## 2023-02-18 MED ORDER — PHENYLEPHRINE HCL-NACL 20-0.9 MG/250ML-% IV SOLN
INTRAVENOUS | Status: DC | PRN
  Administered 2023-02-18: 40 ug/min via INTRAVENOUS

## 2023-02-18 MED ORDER — DEXAMETHASONE SODIUM PHOSPHATE 10 MG/ML IJ SOLN
INTRAMUSCULAR | Status: AC
  Filled 2023-02-18: qty 1

## 2023-02-18 MED ORDER — ROPIVACAINE HCL 7.5 MG/ML IJ SOLN
INTRAMUSCULAR | Status: DC | PRN
  Administered 2023-02-18: 20 mL via PERINEURAL

## 2023-02-18 MED ORDER — ONDANSETRON HCL 4 MG PO TABS: 4.0000 mg | ORAL_TABLET | Freq: Four times a day (QID) | ORAL | Status: DC | PRN

## 2023-02-18 MED ORDER — BUPIVACAINE LIPOSOME 1.3 % IJ SUSP: 20.0000 mL | Freq: Once | INTRAMUSCULAR | Status: DC

## 2023-02-18 MED ORDER — PHENYLEPHRINE HCL (PRESSORS) 10 MG/ML IV SOLN
INTRAVENOUS | Status: DC | PRN
  Administered 2023-02-18: 100 ug via INTRAVENOUS
  Administered 2023-02-18: 80 ug via INTRAVENOUS

## 2023-02-18 MED ORDER — ATENOLOL 50 MG PO TABS: 100.0000 mg | ORAL_TABLET | Freq: Every day | ORAL | Status: DC

## 2023-02-18 MED ORDER — CHLORHEXIDINE GLUCONATE 0.12 % MT SOLN
15.0000 mL | Freq: Once | OROMUCOSAL | Status: AC
  Administered 2023-02-18: 15 mL via OROMUCOSAL

## 2023-02-18 MED ORDER — FENTANYL CITRATE (PF) 100 MCG/2ML IJ SOLN
INTRAMUSCULAR | Status: AC
  Filled 2023-02-18: qty 2

## 2023-02-18 MED ORDER — RIVAROXABAN 10 MG PO TABS
10.0000 mg | ORAL_TABLET | Freq: Every day | ORAL | Status: DC
  Administered 2023-02-19: 10 mg via ORAL
  Filled 2023-02-18: qty 1

## 2023-02-18 MED ORDER — ORAL CARE MOUTH RINSE: 15.0000 mL | OROMUCOSAL | Status: DC | PRN

## 2023-02-18 MED ORDER — OXYCODONE HCL 5 MG PO TABS: 5.0000 mg | ORAL_TABLET | Freq: Once | ORAL | Status: DC | PRN

## 2023-02-18 MED ORDER — SODIUM CHLORIDE (PF) 0.9 % IJ SOLN
INTRAMUSCULAR | Status: AC
  Filled 2023-02-18: qty 10

## 2023-02-18 MED ORDER — METOCLOPRAMIDE HCL 5 MG/ML IJ SOLN: 5.0000 mg | Freq: Three times a day (TID) | INTRAMUSCULAR | Status: DC | PRN

## 2023-02-18 MED ORDER — OXYCODONE HCL 5 MG PO TABS: 5.0000 mg | ORAL_TABLET | ORAL | Status: DC | PRN

## 2023-02-18 MED ORDER — SCOPOLAMINE 1 MG/3DAYS TD PT72
1.0000 | MEDICATED_PATCH | TRANSDERMAL | Status: DC
  Administered 2023-02-18: 1.5 mg via TRANSDERMAL

## 2023-02-18 MED ORDER — ONDANSETRON HCL 4 MG/2ML IJ SOLN: 4.0000 mg | Freq: Four times a day (QID) | INTRAMUSCULAR | Status: DC | PRN

## 2023-02-18 MED ORDER — TRAMADOL HCL 50 MG PO TABS
50.0000 mg | ORAL_TABLET | Freq: Four times a day (QID) | ORAL | Status: DC | PRN
  Administered 2023-02-19 (×3): 100 mg via ORAL
  Filled 2023-02-18 (×3): qty 2

## 2023-02-18 MED ORDER — METHOCARBAMOL 1000 MG/10ML IJ SOLN: 500.0000 mg | Freq: Four times a day (QID) | INTRAVENOUS | Status: DC | PRN

## 2023-02-18 MED ORDER — BUPIVACAINE LIPOSOME 1.3 % IJ SUSP
INTRAMUSCULAR | Status: DC | PRN
  Administered 2023-02-18: 20 mL

## 2023-02-18 MED ORDER — SODIUM CHLORIDE (PF) 0.9 % IJ SOLN
INTRAMUSCULAR | Status: DC | PRN
  Administered 2023-02-18: 50 mL
  Administered 2023-02-18: 10 mL

## 2023-02-18 MED ORDER — INSULIN ASPART 100 UNIT/ML IJ SOLN
0.0000 [IU] | Freq: Three times a day (TID) | INTRAMUSCULAR | Status: DC
  Administered 2023-02-18 (×2): 3 [IU] via SUBCUTANEOUS

## 2023-02-18 MED ORDER — TRANEXAMIC ACID-NACL 1000-0.7 MG/100ML-% IV SOLN
1000.0000 mg | INTRAVENOUS | Status: AC
  Administered 2023-02-18: 1000 mg via INTRAVENOUS
  Filled 2023-02-18: qty 100

## 2023-02-18 MED ORDER — METHOCARBAMOL 500 MG PO TABS
500.0000 mg | ORAL_TABLET | Freq: Four times a day (QID) | ORAL | Status: DC | PRN
  Administered 2023-02-18 – 2023-02-19 (×2): 500 mg via ORAL
  Filled 2023-02-18 (×2): qty 1

## 2023-02-18 MED ORDER — LIDOCAINE HCL (CARDIAC) PF 100 MG/5ML IV SOSY
PREFILLED_SYRINGE | INTRAVENOUS | Status: DC | PRN
  Administered 2023-02-18: 50 mg via INTRAVENOUS

## 2023-02-18 MED ORDER — HYDROMORPHONE HCL 1 MG/ML IJ SOLN: 0.5000 mg | INTRAMUSCULAR | Status: DC | PRN

## 2023-02-18 MED ORDER — FLEET ENEMA 7-19 GM/118ML RE ENEM: 1.0000 | ENEMA | Freq: Once | RECTAL | Status: DC | PRN

## 2023-02-18 MED ORDER — DIPHENHYDRAMINE HCL 12.5 MG/5ML PO ELIX: 12.5000 mg | ORAL_SOLUTION | ORAL | Status: DC | PRN

## 2023-02-18 MED ORDER — BUPIVACAINE LIPOSOME 1.3 % IJ SUSP
INTRAMUSCULAR | Status: AC
  Filled 2023-02-18: qty 20

## 2023-02-18 MED ORDER — MENTHOL 3 MG MT LOZG: 1.0000 | LOZENGE | OROMUCOSAL | Status: DC | PRN

## 2023-02-18 MED ORDER — MIDAZOLAM HCL 5 MG/5ML IJ SOLN
INTRAMUSCULAR | Status: DC | PRN
  Administered 2023-02-18 (×2): 1 mg via INTRAVENOUS

## 2023-02-18 MED ORDER — BUPIVACAINE IN DEXTROSE 0.75-8.25 % IT SOLN
INTRATHECAL | Status: DC | PRN
  Administered 2023-02-18: 1.4 mL via INTRATHECAL

## 2023-02-18 MED ORDER — CEFAZOLIN SODIUM-DEXTROSE 2-4 GM/100ML-% IV SOLN
2.0000 g | Freq: Four times a day (QID) | INTRAVENOUS | Status: AC
  Administered 2023-02-18 (×2): 2 g via INTRAVENOUS
  Filled 2023-02-18 (×2): qty 100

## 2023-02-18 SURGICAL SUPPLY — 58 items
ATTUNE PS FEM RT SZ 4 CEM KNEE (Femur) IMPLANT
ATTUNE PSRP INSR SZ4 10 KNEE (Insert) IMPLANT
BAG COUNTER SPONGE SURGICOUNT (BAG) IMPLANT
BAG SPEC THK2 15X12 ZIP CLS (MISCELLANEOUS) ×1
BAG SPNG CNTER NS LX DISP (BAG)
BAG ZIPLOCK 12X15 (MISCELLANEOUS) ×1 IMPLANT
BASEPLATE TIBIAL ROTATING SZ 4 (Knees) IMPLANT
BLADE SAG 18X100X1.27 (BLADE) ×1 IMPLANT
BLADE SAW SGTL 11.0X1.19X90.0M (BLADE) ×1 IMPLANT
BNDG CMPR 5X62 HK CLSR LF (GAUZE/BANDAGES/DRESSINGS) ×1
BNDG CMPR MED 10X6 ELC LF (GAUZE/BANDAGES/DRESSINGS) ×1
BNDG ELASTIC 6INX 5YD STR LF (GAUZE/BANDAGES/DRESSINGS) ×1 IMPLANT
BNDG ELASTIC 6X10 VLCR STRL LF (GAUZE/BANDAGES/DRESSINGS) IMPLANT
BOWL SMART MIX CTS (DISPOSABLE) ×1 IMPLANT
BSPLAT TIB 4 CMNT ROT PLAT STR (Knees) ×1 IMPLANT
CEMENT HV SMART SET (Cement) ×2 IMPLANT
COVER SURGICAL LIGHT HANDLE (MISCELLANEOUS) ×1 IMPLANT
CUFF TOURN SGL QUICK 34 (TOURNIQUET CUFF) ×1
CUFF TRNQT CYL 34X4.125X (TOURNIQUET CUFF) ×1 IMPLANT
DRAPE INCISE IOBAN 66X45 STRL (DRAPES) ×1 IMPLANT
DRAPE U-SHAPE 47X51 STRL (DRAPES) ×1 IMPLANT
DRSG AQUACEL AG ADV 3.5X10 (GAUZE/BANDAGES/DRESSINGS) ×1 IMPLANT
DURAPREP 26ML APPLICATOR (WOUND CARE) ×1 IMPLANT
ELECT REM PT RETURN 15FT ADLT (MISCELLANEOUS) ×1 IMPLANT
GLOVE BIO SURGEON STRL SZ 6.5 (GLOVE) IMPLANT
GLOVE BIO SURGEON STRL SZ7.5 (GLOVE) IMPLANT
GLOVE BIO SURGEON STRL SZ8 (GLOVE) ×1 IMPLANT
GLOVE BIOGEL PI IND STRL 6.5 (GLOVE) IMPLANT
GLOVE BIOGEL PI IND STRL 7.0 (GLOVE) IMPLANT
GLOVE BIOGEL PI IND STRL 8 (GLOVE) ×1 IMPLANT
GOWN STRL REUS W/ TWL LRG LVL3 (GOWN DISPOSABLE) ×1 IMPLANT
GOWN STRL REUS W/ TWL XL LVL3 (GOWN DISPOSABLE) IMPLANT
GOWN STRL REUS W/TWL LRG LVL3 (GOWN DISPOSABLE) ×1
GOWN STRL REUS W/TWL XL LVL3 (GOWN DISPOSABLE)
HANDPIECE INTERPULSE COAX TIP (DISPOSABLE) ×1
HOLDER FOLEY CATH W/STRAP (MISCELLANEOUS) IMPLANT
IMMOBILIZER KNEE 20 (SOFTGOODS) ×1
IMMOBILIZER KNEE 20 THIGH 36 (SOFTGOODS) ×1 IMPLANT
KIT TURNOVER KIT A (KITS) IMPLANT
MANIFOLD NEPTUNE II (INSTRUMENTS) ×1 IMPLANT
NS IRRIG 1000ML POUR BTL (IV SOLUTION) ×1 IMPLANT
PACK TOTAL KNEE CUSTOM (KITS) ×1 IMPLANT
PADDING CAST COTTON 6X4 STRL (CAST SUPPLIES) ×2 IMPLANT
PATELLA MEDIAL ATTUN 35MM KNEE (Knees) IMPLANT
PIN STEINMAN FIXATION KNEE (PIN) IMPLANT
PROTECTOR NERVE ULNAR (MISCELLANEOUS) ×1 IMPLANT
SET HNDPC FAN SPRY TIP SCT (DISPOSABLE) ×1 IMPLANT
SPIKE FLUID TRANSFER (MISCELLANEOUS) ×1 IMPLANT
STRIP CLOSURE SKIN 1/2X4 (GAUZE/BANDAGES/DRESSINGS) ×2 IMPLANT
SUT MNCRL AB 4-0 PS2 18 (SUTURE) ×1 IMPLANT
SUT STRATAFIX 0 PDS 27 VIOLET (SUTURE) ×1
SUT VIC AB 2-0 CT1 27 (SUTURE) ×3
SUT VIC AB 2-0 CT1 TAPERPNT 27 (SUTURE) ×3 IMPLANT
SUTURE STRATFX 0 PDS 27 VIOLET (SUTURE) ×1 IMPLANT
TRAY FOLEY MTR SLVR 16FR STAT (SET/KITS/TRAYS/PACK) ×1 IMPLANT
TUBE SUCTION HIGH CAP CLEAR NV (SUCTIONS) ×1 IMPLANT
WATER STERILE IRR 1000ML POUR (IV SOLUTION) ×2 IMPLANT
WRAP KNEE MAXI GEL POST OP (GAUZE/BANDAGES/DRESSINGS) ×1 IMPLANT

## 2023-02-18 NOTE — Anesthesia Procedure Notes (Signed)
Anesthesia Procedure Image    

## 2023-02-18 NOTE — Anesthesia Procedure Notes (Signed)
Anesthesia Regional Block: Adductor canal block   Pre-Anesthetic Checklist: , timeout performed,  Correct Patient, Correct Site, Correct Laterality,  Correct Procedure, Correct Position, site marked,  Risks and benefits discussed,  Surgical consent,  Pre-op evaluation,  At surgeon's request and post-op pain management  Laterality: Right  Prep: chloraprep       Needles:  Injection technique: Single-shot  Needle Type: Echogenic Needle     Needle Length: 9cm      Additional Needles:   Procedures:,,,, ultrasound used (permanent image in chart),,    Narrative:  Start time: 02/18/2023 6:49 AM End time: 02/18/2023 6:55 AM Injection made incrementally with aspirations every 5 mL.  Performed by: Personally  Anesthesiologist: Eilene Ghazi, MD  Additional Notes: Patient tolerated the procedure well without complications

## 2023-02-18 NOTE — Anesthesia Procedure Notes (Signed)
Spinal  Patient location during procedure: OR Start time: 02/18/2023 7:12 AM End time: 02/18/2023 7:16 AM Reason for block: surgical anesthesia Staffing Performed: anesthesiologist  Anesthesiologist: Eilene Ghazi, MD Performed by: Eilene Ghazi, MD Authorized by: Eilene Ghazi, MD   Preanesthetic Checklist Completed: patient identified, IV checked, site marked, risks and benefits discussed, surgical consent, monitors and equipment checked, pre-op evaluation and timeout performed Spinal Block Patient position: sitting Prep: Betadine Patient monitoring: heart rate, continuous pulse ox and blood pressure Approach: midline Location: L3-4 Injection technique: single-shot Needle Needle type: Sprotte  Needle gauge: 24 G Needle length: 9 cm Assessment Sensory level: T6 Events: CSF return Additional Notes

## 2023-02-18 NOTE — Progress Notes (Signed)
Orthopedic Tech Progress Note Patient Details:  Angelica Wright [facility] 01-24-1956 [id number]  CPM Right Knee CPM Right Knee: On Right Knee Flexion (Degrees): 40 Right Knee Extension (Degrees): 10  Post Interventions Patient Tolerated: Well Instructions Provided: Care of device  [place] A Gerilyn Pilgrim 02/18/2023, 9:40 AM

## 2023-02-18 NOTE — Transfer of Care (Signed)
Immediate Anesthesia Transfer of Care Note  Patient: Angelica Wright  Procedure(s) Performed: TOTAL KNEE ARTHROPLASTY (Right: Knee)  Patient Location: PACU  Anesthesia Type:Spinal  Level of Consciousness: awake, alert , oriented, and patient cooperative  Airway & Oxygen Therapy: Patient Spontanous Breathing and Patient connected to nasal cannula oxygen  Post-op Assessment: Report given to RN and Post -op Vital signs reviewed and stable  Post vital signs: Reviewed and stable  Last Vitals:  Vitals Value Taken Time  BP    Temp    Pulse    Resp    SpO2      Last Pain:  Vitals:   02/18/23 0600  TempSrc: Oral  PainSc:       Patients Stated Pain Goal: 4 (02/18/23 0553)  Complications: No notable events documented.

## 2023-02-18 NOTE — Plan of Care (Signed)
  Problem: Education: Goal: Knowledge of the prescribed therapeutic regimen will improve Outcome: Progressing   Problem: Activity: Goal: Range of joint motion will improve Outcome: Progressing   Problem: Pain Management: Goal: Pain level will decrease with appropriate interventions Outcome: Progressing   Problem: Safety: Goal: Ability to remain free from injury will improve Outcome: Progressing   

## 2023-02-18 NOTE — Discharge Instructions (Addendum)
 Frank Aluisio, MD Total Joint Specialist [facility] [address] [place], [place] [address] [phone]  TOTAL KNEE REPLACEMENT POSTOPERATIVE DIRECTIONS    Knee Rehabilitation, Guidelines Following Surgery  Results after knee surgery are often greatly improved when you follow the exercise, range of motion and muscle strengthening exercises prescribed by your doctor. Safety measures are also important to protect the knee from further injury. If any of these exercises cause you to have increased pain or swelling in your knee joint, decrease the amount until you are comfortable again and slowly increase them. If you have problems or questions, call your caregiver or physical therapist for advice.   BLOOD CLOT PREVENTION Take a 10 mg Xarelto once a day for three weeks following surgery. Then resume one 81 mg aspirin once a day. You may resume your vitamins/supplements once you have discontinued the Xarelto. Do not take any NSAIDs (Advil, Aleve, Ibuprofen, Meloxicam, etc.) until you have discontinued the Xarelto.   HOME CARE INSTRUCTIONS  Remove items at home which could result in a fall. This includes throw rugs or furniture in walking pathways.  ICE to the affected knee as much as tolerated. Icing helps control swelling. If the swelling is well controlled you will be more comfortable and rehab easier. Continue to use ice on the knee for pain and swelling from surgery. You may notice swelling that will progress down to the foot and ankle. This is normal after surgery. Elevate the leg when you are not up walking on it.    Continue to use the breathing machine which will help keep your temperature down. It is common for your temperature to cycle up and down following surgery, especially at night when you are not up moving around and exerting yourself. The breathing machine keeps your lungs expanded and your temperature down. Do not place pillow under the operative  knee, focus on keeping the knee straight while resting  DIET You may resume your previous home diet once you are discharged from the hospital.  DRESSING / WOUND CARE / SHOWERING Keep your bulky bandage on for 2 days. On the third post-operative day you may remove the Ace bandage and gauze. There is a waterproof adhesive bandage on your skin which will stay in place until your first follow-up appointment. Once you remove this you will not need to place another bandage You may begin showering 3 days following surgery, but do not submerge the incision under water.  ACTIVITY For the first 5 days, the key is rest and control of pain and swelling Do your home exercises twice a day starting on post-operative day 3. On the days you go to physical therapy, just do the home exercises once that day. You should rest, ice and elevate the leg for 50 minutes out of every hour. Get up and walk/stretch for 10 minutes per hour. After 5 days you can increase your activity slowly as tolerated. Walk with your walker as instructed. Use the walker until you are comfortable transitioning to a cane. Walk with the cane in the opposite hand of the operative leg. You may discontinue the cane once you are comfortable and walking steadily. Avoid periods of inactivity such as sitting longer than an hour when not asleep. This helps prevent blood clots.  You may discontinue the knee immobilizer once you are able to perform a straight leg raise while lying down. You may resume a sexual relationship in one month or when given the OK by your   doctor.  You may return to work once you are cleared by your doctor.  Do not drive a car for 6 weeks or until released by your surgeon.  Do not drive while taking narcotics.  TED HOSE STOCKINGS Wear the elastic stockings on both legs for three weeks following surgery during the day. You may remove them at night for sleeping.  WEIGHT BEARING Weight bearing as tolerated with assist device  (walker, cane, etc) as directed, use it as long as suggested by your surgeon or therapist, typically at least 4-6 weeks.  POSTOPERATIVE CONSTIPATION PROTOCOL Constipation - defined medically as fewer than three stools per week and severe constipation as less than one stool per week.  One of the most common issues patients have following surgery is constipation.  Even if you have a regular bowel pattern at home, your normal regimen is likely to be disrupted due to multiple reasons following surgery.  Combination of anesthesia, postoperative narcotics, change in appetite and fluid intake all can affect your bowels.  In order to avoid complications following surgery, here are some recommendations in order to help you during your recovery period.  Colace (docusate) - Pick up an over-the-counter form of Colace or another stool softener and take twice a day as long as you are requiring postoperative pain medications.  Take with a full glass of water daily.  If you experience loose stools or diarrhea, hold the colace until you stool forms back up. If your symptoms do not get better within 1 week or if they get worse, check with your doctor. Dulcolax (bisacodyl) - Pick up over-the-counter and take as directed by the product packaging as needed to assist with the movement of your bowels.  Take with a full glass of water.  Use this product as needed if not relieved by Colace only.  MiraLax (polyethylene glycol) - Pick up over-the-counter to have on hand. MiraLax is a solution that will increase the amount of water in your bowels to assist with bowel movements.  Take as directed and can mix with a glass of water, juice, soda, coffee, or tea. Take if you go more than two days without a movement. Do not use MiraLax more than once per day. Call your doctor if you are still constipated or irregular after using this medication for 7 days in a row.  If you continue to have problems with postoperative constipation, please  contact the office for further assistance and recommendations.  If you experience "the worst abdominal pain ever" or develop nausea or vomiting, please contact the office immediatly for further recommendations for treatment.  ITCHING If you experience itching with your medications, try taking only a single pain pill, or even half a pain pill at a time.  You can also use Benadryl over the counter for itching or also to help with sleep.   MEDICATIONS See your medication summary on the "After Visit Summary" that the nursing staff will review with you prior to discharge.  You may have some home medications which will be placed on hold until you complete the course of blood thinner medication.  It is important for you to complete the blood thinner medication as prescribed by your surgeon.  Continue your approved medications as instructed at time of discharge.  PRECAUTIONS If you experience chest pain or shortness of breath - call 911 immediately for transfer to the hospital emergency department.  If you develop a fever greater that 101 F, purulent drainage from wound, increased redness or   drainage from wound, foul odor from the wound/dressing, or calf pain - CONTACT YOUR SURGEON.                                                   FOLLOW-UP APPOINTMENTS Make sure you keep all of your appointments after your operation with your surgeon and caregivers. You should call the office at the above phone number and make an appointment for approximately two weeks after the date of your surgery or on the date instructed by your surgeon outlined in the "After Visit Summary".  RANGE OF MOTION AND STRENGTHENING EXERCISES  Rehabilitation of the knee is important following a knee injury or an operation. After just a few days of immobilization, the muscles of the thigh which control the knee become weakened and shrink (atrophy). Knee exercises are designed to build up the tone and strength of the thigh muscles and to  improve knee motion. Often times heat used for twenty to thirty minutes before working out will loosen up your tissues and help with improving the range of motion but do not use heat for the first two weeks following surgery. These exercises can be done on a training (exercise) mat, on the floor, on a table or on a bed. Use what ever works the best and is most comfortable for you Knee exercises include:  Leg Lifts - While your knee is still immobilized in a splint or cast, you can do straight leg raises. Lift the leg to 60 degrees, hold for 3 sec, and slowly lower the leg. Repeat 10-20 times 2-3 times daily. Perform this exercise against resistance later as your knee gets better.  Quad and Hamstring Sets - Tighten up the muscle on the front of the thigh (Quad) and hold for 5-10 sec. Repeat this 10-20 times hourly. Hamstring sets are done by pushing the foot backward against an object and holding for 5-10 sec. Repeat as with quad sets.  Leg Slides: Lying on your back, slowly slide your foot toward your buttocks, bending your knee up off the floor (only go as far as is comfortable). Then slowly slide your foot back down until your leg is flat on the floor again. Angel Wings: Lying on your back spread your legs to the side as far apart as you can without causing discomfort.  A rehabilitation program following serious knee injuries can speed recovery and prevent re-injury in the future due to weakened muscles. Contact your doctor or a physical therapist for more information on knee rehabilitation.   POST-OPERATIVE OPIOID TAPER INSTRUCTIONS: It is important to wean off of your opioid medication as soon as possible. If you do not need pain medication after your surgery it is ok to stop day one. Opioids include: Codeine, Hydrocodone(Norco, Vicodin), Oxycodone(Percocet, oxycontin) and hydromorphone amongst others.  Long term and even short term use of opiods can cause: Increased pain  response Dependence Constipation Depression Respiratory depression And more.  Withdrawal symptoms can include Flu like symptoms Nausea, vomiting And more Techniques to manage these symptoms Hydrate well Eat regular healthy meals Stay active Use relaxation techniques(deep breathing, meditating, yoga) Do Not substitute Alcohol to help with tapering If you have been on opioids for less than two weeks and do not have pain than it is ok to stop all together.  Plan to wean off of opioids This plan   should start within one week post op of your joint replacement. Maintain the same interval or time between taking each dose and first decrease the dose.  Cut the total daily intake of opioids by one tablet each day Next start to increase the time between doses. The last dose that should be eliminated is the evening dose.   IF YOU ARE TRANSFERRED TO A SKILLED REHAB FACILITY If the patient is transferred to a skilled rehab facility following release from the hospital, a list of the current medications will be sent to the facility for the patient to continue.  When discharged from the skilled rehab facility, please have the facility set up the patient's Home Health Physical Therapy prior to being released. Also, the skilled facility will be responsible for providing the patient with their medications at time of release from the facility to include their pain medication, the muscle relaxants, and their blood thinner medication. If the patient is still at the rehab facility at time of the two week follow up appointment, the skilled rehab facility will also need to assist the patient in arranging follow up appointment in our office and any transportation needs.  MAKE SURE YOU:  Understand these instructions.  Get help right away if you are not doing well or get worse.   DENTAL ANTIBIOTICS:  In most cases prophylactic antibiotics for Dental procdeures after total joint surgery are not  necessary.  Exceptions are as follows:  1. History of prior total joint infection  2. Severely immunocompromised (Organ Transplant, cancer chemotherapy, Rheumatoid biologic meds such as Humera)  3. Poorly controlled diabetes (A1C &gt; 8.0, blood glucose over 200)  If you have one of these conditions, contact your surgeon for an antibiotic prescription, prior to your dental procedure.    Pick up stool softner and laxative for home use following surgery while on pain medications. Do not submerge incision under water. Please use good hand washing techniques while changing dressing each day. May shower starting three days after surgery. Please use a clean towel to pat the incision dry following showers. Continue to use ice for pain and swelling after surgery. Do not use any lotions or creams on the incision until instructed by your surgeon.  Information on my medicine - XARELTO (Rivaroxaban)  Why was Xarelto prescribed for you? Xarelto was prescribed for you to reduce the risk of blood clots forming after orthopedic surgery. The medical term for these abnormal blood clots is venous thromboembolism (VTE).  What do you need to know about xarelto ? Take your Xarelto ONCE DAILY at the same time every day. You may take it either with or without food.  If you have difficulty swallowing the tablet whole, you may crush it and mix in applesauce just prior to taking your dose.  Take Xarelto exactly as prescribed by your doctor and DO NOT stop taking Xarelto without talking to the doctor who prescribed the medication.  Stopping without other VTE prevention medication to take the place of Xarelto may increase your risk of developing a clot.  After discharge, you should have regular check-up appointments with your healthcare provider that is prescribing your Xarelto.    What do you do if you miss a dose? If you miss a dose, take it as soon as you remember on the same day then continue  your regularly scheduled once daily regimen the next day. Do not take two doses of Xarelto on the same day.   Important Safety Information A possible side effect   of Xarelto is bleeding. You should call your healthcare provider right away if you experience any of the following: Bleeding from an injury or your nose that does not stop. Unusual colored urine (red or dark brown) or unusual colored stools (red or black). Unusual bruising for unknown reasons. A serious fall or if you hit your head (even if there is no bleeding).  Some medicines may interact with Xarelto and might increase your risk of bleeding while on Xarelto. To help avoid this, consult your healthcare provider or pharmacist prior to using any new prescription or non-prescription medications, including herbals, vitamins, non-steroidal anti-inflammatory drugs (NSAIDs) and supplements.  This website has more information on Xarelto: www.xarelto.com.   

## 2023-02-18 NOTE — Op Note (Signed)
OPERATIVE REPORT-TOTAL KNEE ARTHROPLASTY   Pre-operative diagnosis- Osteoarthritis  Right knee(s)  Post-operative diagnosis- Osteoarthritis Right knee(s)  Procedure-  Right  Total Knee Arthroplasty  Surgeon- Angelica Rankin. Cloma Rahrig, MD  Assistant- Leilani Able, PA-C   Anesthesia-   Adductor canal block and spinal  EBL-20 mL   Drains None  Tourniquet time-  Total Tourniquet Time Documented: Thigh (Right) - 31 minutes Total: Thigh (Right) - 31 minutes     Complications- None  Condition-PACU - hemodynamically stable.   Brief Clinical Note  Angelica Wright is a 67 y.o. year old female with end stage OA of her right knee with progressively worsening pain and dysfunction. She has constant pain, with activity and at rest and significant functional deficits with difficulties even with ADLs. She has had extensive non-op management including analgesics, injections of cortisone and viscosupplements, and home exercise program, but remains in significant pain with significant dysfunction.Radiographs show bone on bone arthritis medial and patellofemoral. She presents now for right Total Knee Arthroplasty.     Procedure in detail---   The patient is brought into the operating room and positioned supine on the operating table. After successful administration of  Adductor canal block and spinal,   a tourniquet is placed high on the  Right thigh(s) and the lower extremity is prepped and draped in the usual sterile fashion. Time out is performed by the operating team and then the  Right lower extremity is wrapped in Esmarch, knee flexed and the tourniquet inflated to 300 mmHg.       A midline incision is made with a ten blade through the subcutaneous tissue to the level of the extensor mechanism. A fresh blade is used to make a medial parapatellar arthrotomy. Soft tissue over the proximal medial tibia is subperiosteally elevated to the joint line with a knife and into the semimembranosus bursa with a  Cobb elevator. Soft tissue over the proximal lateral tibia is elevated with attention being paid to avoiding the patellar tendon on the tibial tubercle. The patella is everted, knee flexed 90 degrees and the ACL and PCL are removed. Findings are bone on bone medial and patellofemoral with large global osteophytes        The drill is used to create a starting hole in the distal femur and the canal is thoroughly irrigated with sterile saline to remove the fatty contents. The 5 degree Right  valgus alignment guide is placed into the femoral canal and the distal femoral cutting block is pinned to remove 9 mm off the distal femur. Resection is made with an oscillating saw.      The tibia is subluxed forward and the menisci are removed. The extramedullary alignment guide is placed referencing proximally at the medial aspect of the tibial tubercle and distally along the second metatarsal axis and tibial crest. The block is pinned to remove 60mm off the more deficient medial  side. Resection is made with an oscillating saw. Size 4is the most appropriate size for the tibia and the proximal tibia is prepared with the modular drill and keel punch for that size.      The femoral sizing guide is placed and size 4 is most appropriate. Rotation is marked off the epicondylar axis and confirmed by creating a rectangular flexion gap at 90 degrees. The size 4 cutting block is pinned in this rotation and the anterior, posterior and chamfer cuts are made with the oscillating saw. The intercondylar block is then placed and that cut is made.  Trial size 4 tibial component, trial size 4 posterior stabilized femur and a 10  mm posterior stabilized rotating platform insert trial is placed. Full extension is achieved with excellent varus/valgus and anterior/posterior balance throughout full range of motion. The patella is everted and thickness measured to be 22  mm. Free hand resection is taken to 12 mm, a 35 template is placed, lug  holes are drilled, trial patella is placed, and it tracks normally. Osteophytes are removed off the posterior femur with the trial in place. All trials are removed and the cut bone surfaces prepared with pulsatile lavage. Cement is mixed and once ready for implantation, the size 4 tibial implant, size  4 posterior stabilized femoral component, and the size 35 patella are cemented in place and the patella is held with the clamp. The trial insert is placed and the knee held in full extension. The Exparel (20 ml mixed with 60 ml saline) is injected into the extensor mechanism, posterior capsule, medial and lateral gutters and subcutaneous tissues.  All extruded cement is removed and once the cement is hard the permanent 10 mm posterior stabilized rotating platform insert is placed into the tibial tray.      The wound is copiously irrigated with saline solution and the extensor mechanism closed with # 0 Stratofix suture. The tourniquet is released for a total tourniquet time of 31  minutes. Flexion against gravity is 140 degrees and the patella tracks normally. Subcutaneous tissue is closed with 2.0 vicryl and subcuticular with running 4.0 Monocryl. The incision is cleaned and dried and steri-strips and a bulky sterile dressing are applied. The limb is placed into a knee immobilizer and the patient is awakened and transported to recovery in stable condition.      Please note that a surgical assistant was a medical necessity for this procedure in order to perform it in a safe and expeditious manner. Surgical assistant was necessary to retract the ligaments and vital neurovascular structures to prevent injury to them and also necessary for proper positioning of the limb to allow for anatomic placement of the prosthesis.   Angelica Rankin Makesha Belitz, MD    02/18/2023, 8:17 AM

## 2023-02-18 NOTE — Interval H&P Note (Signed)
History and Physical Interval Note:  02/18/2023 6:33 AM  Angelica Wright  has presented today for surgery, with the diagnosis of right knee osteoarthritis.  The various methods of treatment have been discussed with the patient and family. After consideration of risks, benefits and other options for treatment, the patient has consented to  Procedure(s): TOTAL KNEE ARTHROPLASTY (Right) as a surgical intervention.  The patient's history has been reviewed, patient examined, no change in status, stable for surgery.  I have reviewed the patient's chart and labs.  Questions were answered to the patient's satisfaction.     Homero Fellers Analie Katzman

## 2023-02-18 NOTE — Anesthesia Postprocedure Evaluation (Signed)
Anesthesia Post Note  Patient: Angelica Wright  Procedure(s) Performed: TOTAL KNEE ARTHROPLASTY (Right: Knee)     Patient location during evaluation: PACU Anesthesia Type: Spinal Level of consciousness: oriented and awake and alert Pain management: pain level controlled Vital Signs Assessment: post-procedure vital signs reviewed and stable Respiratory status: spontaneous breathing, respiratory function stable and patient connected to nasal cannula oxygen Cardiovascular status: blood pressure returned to baseline and stable Postop Assessment: no headache, no backache and no apparent nausea or vomiting Anesthetic complications: no  No notable events documented.  Last Vitals:  Vitals:   02/18/23 1000 02/18/23 1015  BP: 110/75 (!) 103/91  Pulse: 66 60  Resp: 14 12  Temp:  36.6 C  SpO2: 100% 100%    Last Pain:  Vitals:   02/18/23 1015  TempSrc:   PainSc: 0-No pain    LLE Motor Response: No movement due to regional block (02/18/23 1015) LLE Sensation: Decreased;Numbness (02/18/23 1015) RLE Motor Response: No movement due to regional block (02/18/23 1015) RLE Sensation: Decreased;Numbness (02/18/23 1015)   R Sensory Level: L4-Anterior knee, lower leg (02/18/23 1015)  Kalya Troeger S

## 2023-02-18 NOTE — Evaluation (Signed)
Physical Therapy Evaluation Patient Details Name: Angelica Wright MRN: [id number] DOB: 1956/02/18 Today's Date: 02/18/2023  History of Present Illness  67 yo female presents to therapy s/p R TKA on 02/18/2023 due to failure of conservative measures. Pt has PMH including but not limited to: HOH, HDL, DM II, DOE, anal ca, uterine ca, HTN, NASH, and TMJ syndrome.  Clinical Impression    Angelica Wright is a 67 y.o. female POD 0 s/p R TKA. Patient reports IND with mobility at baseline. Patient is now limited by functional impairments (see PT problem list below) and requires min guard for bed mobility and min guard and cues for transfers. Patient was able to ambulate 52 feet with RW and min guard level of assist. Pt had episode of LOC during gait tasks, PT able to maintain pt upright and nursing provided chair. Pt BP once seated 73/50. No reports change in pain, pt denies SOB and no initial reports of dizziness or light headedness.  Patient instructed in exercise to facilitate ROM and circulation to manage edema.  Patient will benefit from continued skilled PT interventions to address impairments and progress towards PLOF. Acute PT will follow to progress mobility and stair training in preparation for safe discharge home with family support and pt states OPPT scheduled 4/18.      Recommendations for follow up therapy are one component of a multi-disciplinary discharge planning process, led by the attending physician.  Recommendations may be updated based on patient status, additional functional criteria and insurance authorization.  Follow Up Recommendations       Assistance Recommended at Discharge Intermittent Supervision/Assistance  Patient can return home with the following  A little help with walking and/or transfers;A little help with bathing/dressing/bathroom;Assistance with cooking/housework;Assist for transportation;Help with stairs or ramp for entrance    Equipment Recommendations None  recommended by PT (pt reports DME in home setting)  Recommendations for Other Services       Functional Status Assessment Patient has had a recent decline in their functional status and demonstrates the ability to make significant improvements in function in a reasonable and predictable amount of time.     Precautions / Restrictions Precautions Precautions: Knee;Fall Restrictions Weight Bearing Restrictions: No      Mobility  Bed Mobility Overal bed mobility: Needs Assistance Bed Mobility: Supine to Sit     Supine to sit: Min guard     General bed mobility comments: increased time, cues and HOB elevated    Transfers Overall transfer level: Needs assistance Equipment used: Rolling walker (2 wheels) Transfers: Sit to/from Stand, Bed to chair/wheelchair/BSC Sit to Stand: Min guard Stand pivot transfers: Min guard         General transfer comment: cues for proper UE and AD placement (PT encouraged staff to use BSC  due to LOC and NT aware and pt able to perform SPT to [facility] with min guard and cues)    Ambulation/Gait Ambulation/Gait assistance: Min guard Gait Distance (Feet): 52 Feet Assistive device: Rolling walker (2 wheels) Gait Pattern/deviations: Step-to pattern, Decreased stance time - right (use of B UE support at RW) Gait velocity: decreased     General Gait Details: episode of LOC with gait tasks, BP s/p seated 93/50.  Stairs            Wheelchair Mobility    Modified Rankin (Stroke Patients Only)       Balance Overall balance assessment: Needs assistance Sitting-balance support: Feet supported Sitting balance-Leahy Scale: Fair  Standing balance support: Bilateral upper extremity supported, During functional activity, Reliant on assistive device for balance Standing balance-Leahy Scale: Poor                               Pertinent Vitals/Pain Pain Assessment Pain Assessment: 0-10 Pain Score: 4  (grion pain is worse than  the R knee) Pain Location: R knee and B groin pain Pain Descriptors / Indicators: Burning, Constant, Discomfort Pain Intervention(s): Limited activity within patient's tolerance, Monitored during session, Repositioned, Ice applied    Home Living Family/patient expects to be discharged to:: Private residence Living Arrangements: Spouse/significant other Available Help at Discharge: Family Type of Home: House Home Access: Stairs to enter Entrance Stairs-Rails: Right;Left;Can reach both (first 3 steps) Entrance Stairs-Number of Steps: 4 (3 to a landing and an additional step to enter home) Alternate Level Stairs-Number of Steps: 15 Home Layout: Two level Home Equipment: Agricultural consultant (2 wheels);Cane - single point;Rollator (4 wheels) Additional Comments: iceman machine in home setting    Prior Function Prior Level of Function : Independent/Modified Independent;Driving             Mobility Comments: IND with all ADLs, self care tasks, IADLs and driving       Hand Dominance        Extremity/Trunk Assessment        Lower Extremity Assessment Lower Extremity Assessment: RLE deficits/detail RLE Deficits / Details: DF/PF 5/5, SLT < 10 degree lag RLE Sensation: decreased light touch (pins and needles distal B LEs)    Cervical / Trunk Assessment Cervical / Trunk Assessment: Normal  Communication   Communication: No difficulties  Cognition Arousal/Alertness: Awake/alert Behavior During Therapy: WFL for tasks assessed/performed Overall Cognitive Status: Within Functional Limits for tasks assessed                                          General Comments      Exercises Total Joint Exercises Ankle Circles/Pumps: AROM, Both, 20 reps   Assessment/Plan    PT Assessment Patient needs continued PT services  PT Problem List Decreased strength;Decreased range of motion;Decreased activity tolerance;Decreased balance;Decreased mobility;Decreased  coordination;Decreased cognition;Decreased knowledge of use of DME;Pain       PT Treatment Interventions DME instruction;Gait training;Stair training;Functional mobility training;Therapeutic activities;Therapeutic exercise;Balance training;Neuromuscular re-education;Patient/family education;Modalities    PT Goals (Current goals can be found in the Care Plan section)  Acute Rehab PT Goals Patient Stated Goal: pt reports she wants to be able to travel and visit grandchildren in [place] and possibly return to work PT Goal Formulation: With patient Time For Goal Achievement: 03/04/23 Potential to Achieve Goals: Good    Frequency 7X/week     Co-evaluation               AM-PAC PT "6 Clicks" Mobility  Outcome Measure Help needed turning from your back to your side while in a flat bed without using bedrails?: A Little Help needed moving from lying on your back to sitting on the side of a flat bed without using bedrails?: A Little Help needed moving to and from a bed to a chair (including a wheelchair)?: A Little Help needed standing up from a chair using your arms (e.g., wheelchair or bedside chair)?: A Little Help needed to walk in hospital room?: A Little Help needed climbing 3-5 steps  with a railing? : Total 6 Click Score: 16    End of Session Equipment Utilized During Treatment: Gait belt Activity Tolerance: Treatment limited secondary to medical complications (Comment) (LOC) Patient left: in chair;with call bell/phone within reach (pt insttructed to use call bell and await Staff S and A prior to transfer tasks) Nurse Communication: Mobility status PT Visit Diagnosis: Unsteadiness on feet (R26.81);Other abnormalities of gait and mobility (R26.89);Muscle weakness (generalized) (M62.81);Pain;Difficulty in walking, not elsewhere classified (R26.2) Pain - Right/Left: Right Pain - part of body: Leg;Knee    Time: 1308-6578 PT Time Calculation (min) (ACUTE ONLY): 39  min   Charges:   PT Evaluation $PT Eval Low Complexity: 1 Low PT Treatments $Gait Training: 8-22 mins $Therapeutic Activity: 8-22 mins        Rica Mote, PT   Jacqualyn Posey 02/18/2023, 2:19 PM

## 2023-02-19 ENCOUNTER — Encounter (HOSPITAL_COMMUNITY): Payer: Self-pay | Admitting: Orthopedic Surgery

## 2023-02-19 DIAGNOSIS — M1711 Unilateral primary osteoarthritis, right knee: Secondary | ICD-10-CM | POA: Diagnosis not present

## 2023-02-19 LAB — CBC
HCT: 28.9 % — ABNORMAL LOW (ref 36.0–46.0)
Hemoglobin: 9.8 g/dL — ABNORMAL LOW (ref 12.0–15.0)
MCH: 33.7 pg (ref 26.0–34.0)
MCHC: 33.9 g/dL (ref 30.0–36.0)
MCV: 99.3 fL (ref 80.0–100.0)
Platelets: 209 10*3/uL (ref 150–400)
RBC: 2.91 MIL/uL — ABNORMAL LOW (ref 3.87–5.11)
RDW: 13.1 % (ref 11.5–15.5)
WBC: 8.4 10*3/uL (ref 4.0–10.5)
nRBC: 0 % (ref 0.0–0.2)

## 2023-02-19 LAB — BASIC METABOLIC PANEL
Anion gap: 8 (ref 5–15)
BUN: 15 mg/dL (ref 8–23)
CO2: 23 mmol/L (ref 22–32)
Calcium: 7.7 mg/dL — ABNORMAL LOW (ref 8.9–10.3)
Chloride: 111 mmol/L (ref 98–111)
Creatinine, Ser: 0.63 mg/dL (ref 0.44–1.00)
GFR, Estimated: >60 mL/min (ref >60)
Glucose, Bld: 131 mg/dL — ABNORMAL HIGH (ref 70–99)
Potassium: 3.4 mmol/L — ABNORMAL LOW (ref 3.5–5.1)
Sodium: 142 mmol/L (ref 135–145)

## 2023-02-19 LAB — GLUCOSE, CAPILLARY
Glucose-Capillary: 124 mg/dL — ABNORMAL HIGH (ref 70–99)
Glucose-Capillary: 114 mg/dL — ABNORMAL HIGH (ref 70–99)

## 2023-02-19 MED ORDER — SODIUM CHLORIDE 0.9 % IV BOLUS
250.0000 mL | Freq: Once | INTRAVENOUS | Status: AC
  Administered 2023-02-19: 250 mL via INTRAVENOUS

## 2023-02-19 MED ORDER — OXYCODONE HCL 5 MG PO TABS: 5.0000 mg | ORAL_TABLET | Freq: Four times a day (QID) | ORAL | 0 refills | Status: DC | PRN

## 2023-02-19 MED ORDER — GABAPENTIN 300 MG PO CAPS: ORAL_CAPSULE | ORAL | 0 refills | Status: DC

## 2023-02-19 MED ORDER — METHOCARBAMOL 500 MG PO TABS: 500.0000 mg | ORAL_TABLET | Freq: Four times a day (QID) | ORAL | 0 refills | Status: DC | PRN

## 2023-02-19 MED ORDER — TRAMADOL HCL 50 MG PO TABS: 50.0000 mg | ORAL_TABLET | Freq: Four times a day (QID) | ORAL | 0 refills | Status: DC | PRN

## 2023-02-19 MED ORDER — RIVAROXABAN 10 MG PO TABS: 10.0000 mg | ORAL_TABLET | Freq: Every day | ORAL | 0 refills | Status: AC

## 2023-02-19 NOTE — TOC Transition Note (Signed)
Transition of Care [facility]) - CM/SW Discharge Note  Patient Details  Name: Angelica Wright MRN: [id number] Date of Birth: [birth year]  [facility] CM/SW Contact:  Ewing Schlein, LCSW Phone Number: 02/19/2023, 9:53 AM  Clinical Narrative: Patient is expected to discharge home after working with PT. CSW met with patient to review discharge plan. Patient will go home with OPPT at [facility] with the first appointment scheduled for 02/21/23. Patient has a rolling walker and ice machine at home, so there are no DME needs at this time. TOC signing off.  Final next level of care: OP Rehab Barriers to Discharge: No Barriers Identified  Patient Goals and CMS Choice Choice offered to / list presented to : NA  Discharge Plan and Services Additional resources added to the After Visit Summary for           DME Arranged: N/A DME Agency: NA  Social Determinants of Health (SDOH) Interventions SDOH Screenings   Food Insecurity: No Food Insecurity (02/18/2023)  Housing: Low Risk  (02/18/2023)  Transportation Needs: No Transportation Needs (02/18/2023)  Utilities: Not At Risk (02/18/2023)  Tobacco Use: Low Risk  (02/18/2023)   Readmission Risk Interventions     No data to display

## 2023-02-19 NOTE — Progress Notes (Signed)
PT NOTE  02/19/23 1400  PT Visit Information  Last PT Received On 02/19/23  Assistance Needed Pt progressing well, meeting  goals and feels ready to d/c home with husband assist as needed  History of Present Illness 67 yo female presents to therapy s/p R TKA on 02/18/2023 due to failure of conservative measures. Pt has PMH including but not limited to: HOH, HDL, DM II, DOE, anal ca, uterine ca, HTN, NASH, and TMJ syndrome.  Subjective Data  Patient Stated Goal pt reports she wants to be able to travel and visit grandchildren in [place] and possibly return to work  Precautions  Precautions Knee;Fall  Precaution Comments good quad activation, able to perform SLR I'ly with cues  Restrictions  Weight Bearing Restrictions No  Pain Assessment  Pain Assessment 0-10  Pain Score 3  Pain Location R knee and B groin pain  Pain Descriptors / Indicators Burning;Constant;Discomfort  Pain Intervention(s) Limited activity within patient's tolerance;Monitored during session;Premedicated before session;Repositioned;Ice applied  Cognition  Arousal/Alertness Awake/alert  Behavior During Therapy WFL for tasks assessed/performed  Overall Cognitive Status Within Functional Limits for tasks assessed  Bed Mobility  General bed mobility comments in recliner  Transfers  Overall transfer level Needs assistance  Equipment used Rolling walker (2 wheels)  Transfers Sit to/from Stand  Sit to Stand Supervision  General transfer comment cues for proper UE and RLE position  Ambulation/Gait  Ambulation/Gait assistance Supervision;Modified independent (Device/Increase time)  Gait Distance (Feet) 120 Feet  Gait Pattern/deviations Step-to pattern;Decreased stance time - right  General Gait Details cues for sequence, RW position. no dizziness with gait. steady with RW, no LOB  Gait velocity decreased  Stairs assistance Min guard;Supervision  Stair Management One rail Right;One rail Left;Step to pattern;Sideways   Number of Stairs 5 (x2)  General stair comments cues for sequence, min/guard for safety;  last step up/down to simulate single step at home.  husband present, pt steady, no LOB, no knee buckling  Balance  Overall balance assessment Needs assistance  Sitting-balance support Feet supported  Sitting balance-Leahy Scale Fair  Standing balance support During functional activity;Reliant on assistive device for balance;No upper extremity supported  Standing balance-Leahy Scale Fair  Exercises  Exercises Total Joint  Total Joint Exercises  Ankle Circles/Pumps AROM;Both;10 reps  PT - End of Session  Equipment Utilized During Treatment Gait belt  Activity Tolerance Patient tolerated treatment well  Patient left in chair;with call bell/phone within reach;with family/visitor present (RN aware alaerm plugged in however off d/t batteries dying)  Nurse Communication Mobility status  CPM Right Knee  CPM Right Knee Off   PT - Assessment/Plan  PT Plan Current plan remains appropriate  PT Visit Diagnosis Unsteadiness on feet (R26.81);Other abnormalities of gait and mobility (R26.89);Muscle weakness (generalized) (M62.81);Pain;Difficulty in walking, not elsewhere classified (R26.2)  Pain - Right/Left Right  Pain - part of body Leg;Knee  PT Frequency (ACUTE ONLY) 7X/week  Follow Up Recommendations Follow physician's recommendations for discharge plan and follow up therapies  Assistance recommended at discharge Intermittent Supervision/Assistance  Patient can return home with the following A little help with walking and/or transfers;A little help with bathing/dressing/bathroom;Assistance with cooking/housework;Assist for transportation;Help with stairs or ramp for entrance  PT equipment None recommended by PT (pt reports DME in home setting)  AM-PAC PT "6 Clicks" Mobility Outcome Measure (Version 2)  Help needed turning from your back to your side while in a flat bed without using bedrails? 3  Help  needed moving from lying on your back  to sitting on the side of a flat bed without using bedrails? 3  Help needed moving to and from a bed to a chair (including a wheelchair)? 3  Help needed standing up from a chair using your arms (e.g., wheelchair or bedside chair)? 3  Help needed to walk in hospital room? 3  Help needed climbing 3-5 steps with a railing?  1  6 Click Score 16  Consider Recommendation of Discharge To: Home with [facility]  Progressive Mobility  What is the highest level of mobility based on the progressive mobility assessment? Level 5 (Walks with assist in room/hall) - Balance while stepping forward/back and can walk in room with assist - Complete  PT Goal Progression  Progress towards PT goals Progressing toward goals  Acute Rehab PT Goals  PT Goal Formulation With patient  Time For Goal Achievement 03/04/23  Potential to Achieve Goals Good  PT Time Calculation  PT Start Time (ACUTE ONLY) 1352  PT Stop Time (ACUTE ONLY) 1420  PT Time Calculation (min) (ACUTE ONLY) 28 min  PT Treatments  $Gait Training 23-37 mins

## 2023-02-19 NOTE — Progress Notes (Signed)
Subjective: 1 Day Post-Op Procedure(s) (LRB): TOTAL KNEE ARTHROPLASTY (Right) Patient reports pain as mild.   Patient seen in rounds by Dr. Lequita Halt. Patient is well, and has had no acute complaints or problems No issues overnight. Denies chest pain, SOB, or calf pain. Foley catheter removed this AM.  We will continue therapy today, ambulated 50' yesterday.   Objective: Vital signs in last 24 hours: Temp:  [97.4 F (36.3 C)-98.3 F (36.8 C)] 97.8 F (36.6 C) (04/16 0555) Pulse Rate:  [60-75] 66 (04/16 0555) Resp:  [12-19] 16 (04/16 0555) BP: (93-124)/(48-91) 97/58 (04/16 0555) SpO2:  [93 %-100 %] 97 % (04/16 0555)  Intake/Output from previous day:  Intake/Output Summary (Last 24 hours) at 02/19/2023 0731 Last data filed at 02/19/2023 0600 Gross per 24 hour  Intake 2741.87 ml  Output 1180 ml  Net 1561.87 ml     Intake/Output this shift: No intake/output data recorded.  Labs: Recent Labs    02/19/23 0325  HGB 9.8*   Recent Labs    02/19/23 0325  WBC 8.4  RBC 2.91*  HCT 28.9*  PLT 209   Recent Labs    02/19/23 0325  NA 142  K 3.4*  CL 111  CO2 23  BUN 15  CREATININE 0.63  GLUCOSE 131*  CALCIUM 7.7*   No results for input(s): "LABPT", "INR" in the last 72 hours.  Exam: General - Patient is Alert and Oriented Extremity - Neurologically intact Neurovascular intact Sensation intact distally Dorsiflexion/Plantar flexion intact Dressing - dressing C/D/I Motor Function - intact, moving foot and toes well on exam.   Past Medical History:  Diagnosis Date   Anxiety    Aortic atherosclerosis    Depression    Essential hypertension    GERD (gastroesophageal reflux disease)    History of kidney stones    Hyperlipidemia associated with type 2 diabetes mellitus    Hyperparathyroidism    resolved after surgery   Osteoarthritis    Hands, feet, knees   Osteoporosis    of the spine   Peripheral vascular disease    Rectocele    Sensorineural hearing  loss (SNHL) of both ears    Squamous cell cancer, anus    (Dr. Herold Harms -oncology) -radiation and chemo therapy   TMJ syndrome    Type 2 Diabetes (HCC) 11/2013   controlled with diet/exercise    Assessment/Plan: 1 Day Post-Op Procedure(s) (LRB): TOTAL KNEE ARTHROPLASTY (Right) Principal Problem:   OA (osteoarthritis) of knee Active Problems:   Primary osteoarthritis of right knee  Estimated body mass index is 30 kg/m as calculated from the following:   Height as of this encounter:  (1.575 m).   Weight as of this encounter: 74.4 kg. Advance diet Up with therapy D/C IV fluids   Patient's anticipated LOS is less than 2 midnights, meeting these requirements: - Lives within 1 hour of care - Has a competent adult at home to recover with post-op recover - NO history of  - Chronic pain requiring opiods  - Coronary Artery Disease  - Heart failure  - Heart attack  - Stroke  - DVT/VTE  - Cardiac arrhythmia  - Respiratory Failure/COPD  - Renal failure  - Anemia  - Advanced Liver disease     DVT Prophylaxis - Xarelto Weight bearing as tolerated. Continue therapy.  BP soft this AM, bolus ordered.  Plan is to go Home after hospital stay. Plan for discharge later today if progresses with therapy and meeting goals. Scheduled for  OPPT at [facility]. Follow-up in the office in 2 weeks.  The PDMP database was reviewed today prior to any opioid medications being prescribed to this patient.  Arther Abbott, PA-C Orthopedic Surgery [phone] 02/19/2023, 7:31 AM

## 2023-02-19 NOTE — Progress Notes (Signed)
Physical Therapy Treatment Patient Details Name: Angelica Wright MRN: [id number] DOB: 11-13-1955 Today's Date: 02/19/2023   History of Present Illness 67 yo female presents to therapy s/p R TKA on 02/18/2023 due to failure of conservative measures. Pt has PMH including but not limited to: HOH, HDL, DM II, DOE, anal ca, uterine ca, HTN, NASH, and TMJ syndrome.    PT Comments    Pt progressing well, no dizziness with amb; will see again in pm, pt is hopeful to d/c later today   Recommendations for follow up therapy are one component of a multi-disciplinary discharge planning process, led by the attending physician.  Recommendations may be updated based on patient status, additional functional criteria and insurance authorization.  Follow Up Recommendations       Assistance Recommended at Discharge Intermittent Supervision/Assistance  Patient can return home with the following A little help with walking and/or transfers;A little help with bathing/dressing/bathroom;Assistance with cooking/housework;Assist for transportation;Help with stairs or ramp for entrance   Equipment Recommendations  None recommended by PT (pt reports DME in home setting)    Recommendations for Other Services       Precautions / Restrictions Precautions Precautions: Knee;Fall Precaution Comments: good quad activation, able to perform SLR I'ly with cues Restrictions Weight Bearing Restrictions: No     Mobility  Bed Mobility               General bed mobility comments: in recliner    Transfers Overall transfer level: Needs assistance Equipment used: Rolling walker (2 wheels) Transfers: Sit to/from Stand Sit to Stand: Supervision           General transfer comment: cues for proper UE and RLE position (PT encouraged staff to use BSC  due to LOC and NT aware and pt able to perform SPT to [facility] with min guard and cues)    Ambulation/Gait Ambulation/Gait assistance: Min guard, Supervision Gait  Distance (Feet): 80 Feet Assistive device: Rolling walker (2 wheels) Gait Pattern/deviations: Step-to pattern, Decreased stance time - right (use of B UE support at RW) Gait velocity: decreased     General Gait Details: cues for sequence, RW position. no dizziness with gait. steady with RW, no LOB   Stairs             Wheelchair Mobility    Modified Rankin (Stroke Patients Only)       Balance Overall balance assessment: Needs assistance Sitting-balance support: Feet supported Sitting balance-Leahy Scale: Fair     Standing balance support: During functional activity, Reliant on assistive device for balance, No upper extremity supported Standing balance-Leahy Scale: Fair                              Cognition Arousal/Alertness: Awake/alert Behavior During Therapy: WFL for tasks assessed/performed Overall Cognitive Status: Within Functional Limits for tasks assessed                                          Exercises Total Joint Exercises Ankle Circles/Pumps: AROM, Both, 10 reps Quad Sets: AROM, Both, 10 reps Heel Slides: AAROM, 10 reps, Right Straight Leg Raises: AROM, Right, 10 reps    General Comments        Pertinent Vitals/Pain Pain Assessment Pain Assessment: 0-10 Pain Location: R knee and B groin pain Pain Descriptors / Indicators: Burning, Constant, Discomfort  Home Living                          Prior Function            PT Goals (current goals can now be found in the care plan section) Acute Rehab PT Goals Patient Stated Goal: pt reports she wants to be able to travel and visit grandchildren in [place] and possibly return to work PT Goal Formulation: With patient Time For Goal Achievement: 03/04/23 Potential to Achieve Goals: Good Progress towards PT goals: Progressing toward goals    Frequency    7X/week      PT Plan Current plan remains appropriate    Co-evaluation               AM-PAC PT "6 Clicks" Mobility   Outcome Measure  Help needed turning from your back to your side while in a flat bed without using bedrails?: A Little Help needed moving from lying on your back to sitting on the side of a flat bed without using bedrails?: A Little Help needed moving to and from a bed to a chair (including a wheelchair)?: A Little Help needed standing up from a chair using your arms (e.g., wheelchair or bedside chair)?: A Little Help needed to walk in hospital room?: A Little Help needed climbing 3-5 steps with a railing? : Total 6 Click Score: 16    End of Session Equipment Utilized During Treatment: Gait belt Activity Tolerance: Treatment limited secondary to medical complications (Comment) (LOC) Patient left: in chair;with call bell/phone within reach (RN aware alaerm plugged in however off d/t batteries dying) Nurse Communication: Mobility status PT Visit Diagnosis: Unsteadiness on feet (R26.81);Other abnormalities of gait and mobility (R26.89);Muscle weakness (generalized) (M62.81);Pain;Difficulty in walking, not elsewhere classified (R26.2) Pain - Right/Left: Right Pain - part of body: Leg;Knee     Time:  -     Charges:                        Khamauri Bauernfeind, PT  Acute Rehab Dept ([facility]) [phone]  02/19/2023    [facility] 02/19/2023, 10:09 AM

## 2023-02-20 NOTE — Discharge Summary (Signed)
Patient ID: Angelica Wright MRN: [id number] DOB/AGE: 12/05/1955 67 y.o.  Admit date: 02/18/2023 Discharge date: 02/19/2023  Admission Diagnoses:  Principal Problem:   OA (osteoarthritis) of knee Active Problems:   Primary osteoarthritis of right knee   Discharge Diagnoses:  Same  Past Medical History:  Diagnosis Date   Anxiety    Aortic atherosclerosis    Depression    Essential hypertension    GERD (gastroesophageal reflux disease)    History of kidney stones    Hyperlipidemia associated with type 2 diabetes mellitus    Hyperparathyroidism    resolved after surgery   Osteoarthritis    Hands, feet, knees   Osteoporosis    of the spine   Peripheral vascular disease    Rectocele    Sensorineural hearing loss (SNHL) of both ears    Squamous cell cancer, anus    (Dr. Herold Harms -oncology) -radiation and chemo therapy   TMJ syndrome    Type 2 Diabetes (HCC) 11/2013   controlled with diet/exercise    Surgeries: Procedure(s): TOTAL KNEE ARTHROPLASTY on 02/18/2023   Consultants:   Discharged Condition: Improved  Hospital Course: [name] is an 67 y.o. female who was admitted 02/18/2023 for operative treatment ofOA (osteoarthritis) of knee. Patient has severe unremitting pain that affects sleep, daily activities, and work/hobbies. After pre-op clearance the patient was taken to the operating room on 02/18/2023 and underwent  Procedure(s): TOTAL KNEE ARTHROPLASTY.    Patient was given perioperative antibiotics:  Anti-infectives (From admission, onward)    Start     Dose/Rate Route Frequency Ordered Stop   02/18/23 1300  ceFAZolin (ANCEF) IVPB 2g/100 mL premix        2 g 200 mL/hr over 30 Minutes Intravenous Every 6 hours 02/18/23 1051 02/18/23 1831   02/18/23 0600  ceFAZolin (ANCEF) IVPB 2g/100 mL premix        2 g 200 mL/hr over 30 Minutes Intravenous On call to O.R. 02/18/23 0531 02/18/23 4098        Patient was given sequential compression devices, early  ambulation, and chemoprophylaxis to prevent DVT.  Patient benefited maximally from hospital stay and there were no complications.    Recent vital signs: No data found.   Recent laboratory studies:  Recent Labs    02/19/23 0325  WBC 8.4  HGB 9.8*  HCT 28.9*  PLT 209  NA 142  K 3.4*  CL 111  CO2 23  BUN 15  CREATININE 0.63  GLUCOSE 131*  CALCIUM 7.7*     Discharge Medications:   Allergies as of 02/19/2023       Reactions   Polymyxin B-trimethoprim Itching   Sulfa Antibiotics Nausea And Vomiting        Medication List     STOP taking these medications    aspirin EC 81 MG tablet   MULTIVITAMIN/IRON PO   Vitamin C Plus Wild [place] Hips 500 MG Chew Generic drug: Ascorbic Acid       TAKE these medications    Accu-Chek Guide w/Device Kit daily.   accu-chek multiclix lancets See admin instructions.   acetaminophen 500 MG tablet Commonly known as: TYLENOL Take 1,000 mg by mouth every 6 (six) hours as needed for moderate pain or headache.   atenolol 100 MG tablet Commonly known as: TENORMIN Take 100 mg by mouth at bedtime.   atorvastatin 20 MG tablet Commonly known as: LIPITOR Take 20 mg by mouth at bedtime.   clobetasol ointment 0.05 % Commonly known as: TEMOVATE  Apply a pea sized amount topically 1-2 x a week and  BID x 1-2 weeks prn flare   Estradiol 10 MCG Tabs vaginal tablet Place 1 tablet (10 mcg total) vaginally 2 (two) times a week.   gabapentin 300 MG capsule Commonly known as: NEURONTIN Take a 300 mg capsule three times a day for two weeks following surgery.Then take a 300 mg capsule two times a day for two weeks. Then take a 300 mg capsule once a day for two weeks. Then discontinue.   hydrochlorothiazide 12.5 MG tablet Commonly known as: HYDRODIURIL Take 12.5 mg by mouth daily.   methocarbamol 500 MG tablet Commonly known as: ROBAXIN Take 1 tablet (500 mg total) by mouth every 6 (six) hours as needed for muscle spasms.   oxyCODONE  5 MG immediate release tablet Commonly known as: Oxy IR/ROXICODONE Take 1-2 tablets (5-10 mg total) by mouth every 6 (six) hours as needed for severe pain.   rivaroxaban 10 MG Tabs tablet Commonly known as: XARELTO Take 1 tablet (10 mg total) by mouth daily with breakfast for 20 days. Then resume one 81 mg aspirin once a day.   traMADol 50 MG tablet Commonly known as: ULTRAM Take 1-2 tablets (50-100 mg total) by mouth every 6 (six) hours as needed for moderate pain.   valACYclovir 500 MG tablet Commonly known as: VALTREX TAKE ONE TAB BY MOUTH TWICE A DAY FOR 3 DAYS WITH FLARES.               Discharge Care Instructions  (From admission, onward)           Start     Ordered   02/19/23 0000  Weight bearing as tolerated        02/19/23 0733   02/19/23 0000  Change dressing       Comments: You may remove the bulky bandage (ACE wrap and gauze) two days after surgery. You will have an adhesive waterproof bandage underneath. Leave this in place until your first follow-up appointment.   02/19/23 0733            Diagnostic Studies: No results found.  Disposition: Discharge disposition: 01-Home or Self Care       Discharge Instructions     Call MD / Call 911   Complete by: As directed    If you experience chest pain or shortness of breath, CALL 911 and be transported to the hospital emergency room.  If you develope a fever above 101 F, pus (white drainage) or increased drainage or redness at the wound, or calf pain, call your surgeon's office.   Change dressing   Complete by: As directed    You may remove the bulky bandage (ACE wrap and gauze) two days after surgery. You will have an adhesive waterproof bandage underneath. Leave this in place until your first follow-up appointment.   Constipation Prevention   Complete by: As directed    Drink plenty of fluids.  Prune juice may be helpful.  You may use a stool softener, such as Colace (over the counter) 100 mg twice  a day.  Use MiraLax (over the counter) for constipation as needed.   Diet - low sodium heart healthy   Complete by: As directed    Do not put a pillow under the knee. Place it under the heel.   Complete by: As directed    Driving restrictions   Complete by: As directed    No driving for two weeks   Post-operative  opioid taper instructions:   Complete by: As directed    POST-OPERATIVE OPIOID TAPER INSTRUCTIONS: It is important to wean off of your opioid medication as soon as possible. If you do not need pain medication after your surgery it is ok to stop day one. Opioids include: Codeine, Hydrocodone(Norco, Vicodin), Oxycodone(Percocet, oxycontin) and hydromorphone amongst others.  Long term and even short term use of opiods can cause: Increased pain response Dependence Constipation Depression Respiratory depression And more.  Withdrawal symptoms can include Flu like symptoms Nausea, vomiting And more Techniques to manage these symptoms Hydrate well Eat regular healthy meals Stay active Use relaxation techniques(deep breathing, meditating, yoga) Do Not substitute Alcohol to help with tapering If you have been on opioids for less than two weeks and do not have pain than it is ok to stop all together.  Plan to wean off of opioids This plan should start within one week post op of your joint replacement. Maintain the same interval or time between taking each dose and first decrease the dose.  Cut the total daily intake of opioids by one tablet each day Next start to increase the time between doses. The last dose that should be eliminated is the evening dose.      TED hose   Complete by: As directed    Use stockings (TED hose) for three weeks on both leg(s).  You may remove them at night for sleeping.   Weight bearing as tolerated   Complete by: As directed         Follow-up Information     Aluisio, Homero Fellers, MD. Schedule an appointment as soon as possible for a visit in 2  week(s).   Specialty: Orthopedic Surgery Contact information: [address] [place] [address] [phone]                  Signed: Arther Abbott 02/20/2023, 8:49 AM

## 2023-03-26 MED ORDER — DENOSUMAB 60 MG/ML ~~LOC~~ SOSY
60.0000 mg | PREFILLED_SYRINGE | Freq: Once | SUBCUTANEOUS | Status: AC
Start: 2023-04-04 — End: ?

## 2023-03-26 NOTE — Telephone Encounter (Signed)
Summary of benefits scanned into Epic.   Encounter closed.  

## 2023-04-04 ENCOUNTER — Ambulatory Visit: Payer: Medicare Other

## 2023-04-23 ENCOUNTER — Encounter: Payer: Self-pay | Admitting: Hematology

## 2023-05-06 ENCOUNTER — Other Ambulatory Visit: Payer: Self-pay | Admitting: Family Medicine

## 2023-05-06 DIAGNOSIS — Z1231 Encounter for screening mammogram for malignant neoplasm of breast: Secondary | ICD-10-CM

## 2023-05-23 ENCOUNTER — Ambulatory Visit: Payer: Medicare Other | Admitting: Podiatry

## 2023-05-24 ENCOUNTER — Ambulatory Visit (INDEPENDENT_AMBULATORY_CARE_PROVIDER_SITE_OTHER): Payer: Medicare Other

## 2023-05-24 ENCOUNTER — Ambulatory Visit (INDEPENDENT_AMBULATORY_CARE_PROVIDER_SITE_OTHER): Payer: Medicare Other | Admitting: Podiatry

## 2023-05-24 DIAGNOSIS — M19071 Primary osteoarthritis, right ankle and foot: Secondary | ICD-10-CM

## 2023-05-24 DIAGNOSIS — E1149 Type 2 diabetes mellitus with other diabetic neurological complication: Secondary | ICD-10-CM

## 2023-05-24 DIAGNOSIS — M79671 Pain in right foot: Secondary | ICD-10-CM

## 2023-05-24 NOTE — Progress Notes (Unsigned)
Tingling and buring right more than left Couple time a week.

## 2023-05-24 NOTE — Patient Instructions (Signed)
The creams we discussed were VOLTAREN GEL, ASPERCREME WITH LIDOCAINE and CAPSAICIN   The vitamins are ALPHA-LIPOIC ACID and a B-COMPLEX vitamin  Diabetes Mellitus and Foot Care Diabetes, also called diabetes mellitus, may cause problems with your feet and legs because of poor blood flow (circulation). Poor circulation may make your skin: Become thinner and drier. Break more easily. Heal more slowly. Peel and crack. You may also have nerve damage (neuropathy). This can cause decreased feeling in your legs and feet. This means that you may not notice minor injuries to your feet that could lead to more serious problems. Finding and treating problems early is the best way to prevent future foot problems. How to care for your feet Foot hygiene  Wash your feet daily with warm water and mild soap. Do not use hot water. Then, pat your feet and the areas between your toes until they are fully dry. Do not soak your feet. This can dry your skin. Trim your toenails straight across. Do not dig under them or around the cuticle. File the edges of your nails with an emery board or nail file. Apply a moisturizing lotion or petroleum jelly to the skin on your feet and to dry, brittle toenails. Use lotion that does not contain alcohol and is unscented. Do not apply lotion between your toes. Shoes and socks Wear clean socks or stockings every day. Make sure they are not too tight. Do not wear knee-high stockings. These may decrease blood flow to your legs. Wear shoes that fit well and have enough cushioning. Always look in your shoes before you put them on to be sure there are no objects inside. To break in new shoes, wear them for just a few hours a day. This prevents injuries on your feet. Wounds, scrapes, corns, and calluses  Check your feet daily for blisters, cuts, bruises, sores, and redness. If you cannot see the bottom of your feet, use a mirror or ask someone for help. Do not cut off corns or calluses  or try to remove them with medicine. If you find a minor scrape, cut, or break in the skin on your feet, keep it and the skin around it clean and dry. You may clean these areas with mild soap and water. Do not clean the area with peroxide, alcohol, or iodine. If you have a wound, scrape, corn, or callus on your foot, look at it several times a day to make sure it is healing and not infected. Check for: Redness, swelling, or pain. Fluid or blood. Warmth. Pus or a bad smell. General tips Do not cross your legs. This may decrease blood flow to your feet. Do not use heating pads or hot water bottles on your feet. They may burn your skin. If you have lost feeling in your feet or legs, you may not know this is happening until it is too late. Protect your feet from hot and cold by wearing shoes, such as at the beach or on hot pavement. Schedule a complete foot exam at least once a year or more often if you have foot problems. Report any cuts, sores, or bruises to your health care provider right away. Where to find more information American Diabetes Association: diabetes.org Association of Diabetes Care & Education Specialists: diabeteseducator.org Contact a health care provider if: You have a condition that increases your risk of infection, and you have any cuts, sores, or bruises on your feet. You have an injury that is not healing. You have  redness on your legs or feet. You feel burning or tingling in your legs or feet. You have pain or cramps in your legs and feet. Your legs or feet are numb. Your feet always feel cold. You have pain around any toenails. Get help right away if: You have a wound, scrape, corn, or callus on your foot and: You have signs of infection. You have a fever. You have a red line going up your leg. This information is not intended to replace advice given to you by your health care provider. Make sure you discuss any questions you have with your health care  provider. Document Revised: 04/25/2022 Document Reviewed: 04/25/2022 Elsevier Patient Education  2024 [employer].

## 2023-05-25 ENCOUNTER — Encounter: Payer: Self-pay | Admitting: Podiatry

## 2023-06-04 ENCOUNTER — Other Ambulatory Visit: Payer: Self-pay | Admitting: Podiatry

## 2023-06-04 DIAGNOSIS — E1149 Type 2 diabetes mellitus with other diabetic neurological complication: Secondary | ICD-10-CM

## 2023-06-04 DIAGNOSIS — M19071 Primary osteoarthritis, right ankle and foot: Secondary | ICD-10-CM

## 2023-06-07 ENCOUNTER — Telehealth: Payer: Self-pay

## 2023-06-07 NOTE — Telephone Encounter (Signed)
Pts last OV was 02/13/23 with Dr. Herbie Baltimore.     Pre-operative Risk Assessment    Patient Name: Angelica Wright [facility]  DOB: 01-07-1956 MRN: [id number]      Request for Surgical Clearance    Procedure:   Left Total Knee Arthroplasty  Date of Surgery:  Clearance 09/09/23                                 Surgeon:  Dr. Ollen Gross Surgeon's Group or Practice Name:  [facility] Phone number:  [phone]  Fax number:  [phone] Angelica Wright   Type of Clearance Requested:   - Medical  - Pharmacy:  [facility] requesting office is asking for ASA instructions, however we do not have ASA on pts med list   Type of Anesthesia:   Choice   Additional requests/questions:    Angelica Wright   06/07/2023, 10:01 AM

## 2023-06-24 ENCOUNTER — Ambulatory Visit: Payer: Medicare Other

## 2023-06-27 ENCOUNTER — Ambulatory Visit
Admission: RE | Admit: 2023-06-27 | Discharge: 2023-06-27 | Disposition: A | Discharge status: Home / Self Care | Payer: Medicare Other | Source: Ambulatory Visit | Attending: Family Medicine | Admitting: Family Medicine

## 2023-06-27 DIAGNOSIS — Z1231 Encounter for screening mammogram for malignant neoplasm of breast: Secondary | ICD-10-CM

## 2023-07-11 ENCOUNTER — Ambulatory Visit: Payer: Medicare Other | Admitting: Obstetrics and Gynecology

## 2023-08-23 ENCOUNTER — Ambulatory Visit (INDEPENDENT_AMBULATORY_CARE_PROVIDER_SITE_OTHER): Payer: Medicare Other

## 2023-08-23 ENCOUNTER — Ambulatory Visit (INDEPENDENT_AMBULATORY_CARE_PROVIDER_SITE_OTHER): Payer: Medicare Other | Admitting: Podiatry

## 2023-08-23 DIAGNOSIS — M778 Other enthesopathies, not elsewhere classified: Secondary | ICD-10-CM

## 2023-08-23 DIAGNOSIS — M7751 Other enthesopathy of right foot: Secondary | ICD-10-CM

## 2023-08-23 MED ORDER — MELOXICAM 7.5 MG PO TABS: 7.5000 mg | ORAL_TABLET | Freq: Every day | ORAL | 0 refills | Status: DC | PRN

## 2023-08-23 NOTE — Progress Notes (Unsigned)
Subjective: Chief Complaint  Patient presents with   Foot Pain    Right foot callus. Super tender & painful. Does have on both feet but right foot is worse.    67 year old female presents the above concerns.  She is about 2 weeks ago she started developing pain submetatarsal on her right foot she denies swelling.  No injuries.  She is concerned about gout.  No recent treatment.  No open lesions.  No other concerns.  Last A1c was 7 she states  Objective: AAO x3, NAD DP/PT pulses palpable bilaterally, CRT less than 3 seconds On the right foot submetatarsal  is an area of edema normally.  There is no erythema or warmth.  No pain with MPJ range of motion.  There is no crepitation.  No signs of cellulitis.  No area pinpoint tenderness. No pain with calf compression, swelling, warmth, erythema  Assessment: Bursitis, capsulitis right foot  Plan: -All treatment options discussed with the patient including all alternatives, risks, complications.  -X-rays obtained reviewed.  No evidence of acute fracture.  No calcifications noted. -Offered steroid injection  -Prescribed mobic. Discussed side effects of the medication and directed to stop if any are to occur and call the office.  -Offloading -Check uric acid level. -Patient encouraged to call the office with any questions, concerns, change in symptoms.   Vivi Barrack DPM

## 2023-08-24 LAB — URIC ACID: Uric Acid: 4.5 mg/dL (ref 3.0–7.2)

## 2023-09-02 ENCOUNTER — Ambulatory Visit: Payer: Medicare Other | Admitting: Podiatry

## 2023-09-03 ENCOUNTER — Ambulatory Visit: Payer: Medicare Other | Admitting: Obstetrics and Gynecology

## 2023-09-09 ENCOUNTER — Ambulatory Visit: Admit: 2023-09-09 | Payer: Medicare Other | Admitting: Orthopedic Surgery

## 2023-09-09 SURGERY — ARTHROPLASTY, KNEE, TOTAL
Anesthesia: Choice | Site: Knee | Laterality: Left

## 2023-09-13 ENCOUNTER — Other Ambulatory Visit: Payer: Self-pay | Admitting: Medical Genetics

## 2023-09-13 DIAGNOSIS — Z006 Encounter for examination for normal comparison and control in clinical research program: Secondary | ICD-10-CM

## 2023-10-08 ENCOUNTER — Telehealth: Payer: Self-pay | Admitting: *Deleted

## 2023-10-08 NOTE — Telephone Encounter (Signed)
   Pre-operative Risk Assessment    Patient Name: Angelica Wright  DOB: 1956/11/01 MRN: [id number]  DATE OF LAST VISIT: 02/13/23 DR. HARDING DATE OF NEXT VISIT: NONE    Request for Surgical Clearance    Procedure:   LEFT TOTAL KNEE ARTHROPLASTY  Date of Surgery:  Clearance 01/06/24                                 Surgeon:  DR. Ollen Gross Surgeon's Group or Practice Name:  [facility] Phone number:  [phone] Aida Raider Fax number:  [phone]   Type of Clearance Requested:   - Medical ; NONE INDICATED TO BE HELD   Type of Anesthesia:   CHOICE   Additional requests/questions:    Elpidio Anis   10/08/2023, 6:08 PM

## 2023-10-09 NOTE — Telephone Encounter (Signed)
   Name: Angelica Wright  DOB: April 10, 1956  MRN: [id number]  Primary Cardiologist: Bryan Lemma, MD  Chart reviewed as part of pre-operative protocol coverage. She has not been seen in the office since 02/13/2023 and surgery is not scheduled until 01/06/2024. Therefore, I think that it is best to just bring patient in for a in-office visit in order to better assess preoperative cardiovascular risk given it will be almost 1 year since her last visit by the time of surgery. This can also likely just serve as her annual visit. Recommend this visit be scheduled for early February.   Pre-op covering staff: - Please schedule appointment and call patient to inform them. If patient already had an upcoming appointment within acceptable timeframe, please add "pre-op clearance" to the appointment notes so provider is aware. - Please contact requesting surgeon's office via preferred method (i.e, phone, fax) to inform them of need for appointment prior to surgery.  Corrin Parker, PA-C  10/09/2023, 7:55 AM

## 2023-10-09 NOTE — Telephone Encounter (Signed)
Left message to call back to schedule in office appt for pre op clearance.

## 2023-10-14 NOTE — Telephone Encounter (Signed)
2nd attempt to reach the pt to schedule an IN OFFICE PRE OP CLEARANCE APPT. See notes to schedule appt sometime in Feb 2025 per opre op APP.

## 2023-10-15 ENCOUNTER — Other Ambulatory Visit (HOSPITAL_COMMUNITY): Payer: Self-pay

## 2023-10-15 NOTE — Telephone Encounter (Signed)
3rd attempt to reach the pt . See previous notes. I will update the surgeon's office pt needs in office appt sometime early Feb 2025.   Will remove from pre op call back. Pt can call back and ask scheduler to schedule in office appt.

## 2023-11-11 ENCOUNTER — Other Ambulatory Visit (HOSPITAL_COMMUNITY): Payer: Self-pay

## 2023-11-18 ENCOUNTER — Ambulatory Visit: Payer: Medicare Other | Admitting: Cardiology

## 2023-11-25 ENCOUNTER — Ambulatory Visit (INDEPENDENT_AMBULATORY_CARE_PROVIDER_SITE_OTHER): Payer: Medicare Other | Admitting: Podiatry

## 2023-11-25 ENCOUNTER — Encounter: Payer: Self-pay | Admitting: Podiatry

## 2023-11-25 DIAGNOSIS — E1149 Type 2 diabetes mellitus with other diabetic neurological complication: Secondary | ICD-10-CM

## 2023-11-25 DIAGNOSIS — L853 Xerosis cutis: Secondary | ICD-10-CM

## 2023-11-25 DIAGNOSIS — M778 Other enthesopathies, not elsewhere classified: Secondary | ICD-10-CM

## 2023-11-25 NOTE — Progress Notes (Unsigned)
Subjective: Chief Complaint  Patient presents with   Foot Pain    RM#11 Follow up right foot patient states doing better no swelling or pain at this time has a small area on left foot would like looked at.   68 year old female presents the office with above concerns.  She said the right foot is doing well and not be any issues related resolved.  She is not dry skin to her feet but she also notes a spot in her left arch has some localized swelling.  She does not report any injury or stepping any foreign objects.  This has improved but she still does have it checked.  Objective: AAO x3, NAD DP/PT pulses palpable bilaterally, CRT less than 3 seconds Sensation intact with Semmes Weinstein monofilament. On the right foot there is no pain.  On the left along the arch of the foot there is 1 small area that has a faint amount of erythema, dry skin present there is no puncture wound noted.  There is trace edema.  There is no erythema otherwise there is no signs of cellulitis.  There is no drainage or pus.  No fluctuation or crepitation.  No malodor. No pain with calf compression, swelling, warmth, erythema  Assessment: Resolved foot pain on the right; localized swelling left foot with improvement  Plan: -All treatment options discussed with the patient including all alternatives, risks, complications.  -Left foot seems to improve on its own over time.  I discussed moisturizer to help with the dry skin.  She does not recall any injuries to her stepping on any foreign objects.  The symptoms are improved on her own expected to continue to improve.  Should this recur or she notices any signs or symptoms of infection to let me know. -Discussed daily foot inspection. -Patient encouraged to call the office with any questions, concerns, change in symptoms.

## 2023-11-25 NOTE — Patient Instructions (Signed)
  Choose a moisturizer from  the list below:  For normal skin: Moisturize feet once daily; do not apply between toes A.  CeraVe Daily Moisturizing Lotion B.  Lubriderm Advanced Therapy Lotion or Lubriderm Intense Skin Repair Lotion C.  Aquaphor Intensive Repair Lotion D.  Gold Bond Ultimate Diabetic Foot Lotion E.  Eucerin Intensive Repair Moisturizing Lotion  For extremely dry, cracked feet: moisturize feet once daily; do not apply between toes A. CeraVe Healing Ointment B. Eucerin Aquaphor Repairing Ointment (may be labeled Aquaphor Healing Ointment) C. Vaseline Petroleum Healing Jelly   If you have problems reaching your feet: apply to feet once daily; do not apply between toes A.  Eucerin Aquaphor Ointment Body Spray  B.  Vaseline Intensive Care Spray Moisturizer (Unscented,  Cocoa Radiant Spray or Aloe Smooth Spray)

## 2023-11-29 ENCOUNTER — Other Ambulatory Visit: Payer: Self-pay

## 2023-11-29 DIAGNOSIS — C210 Malignant neoplasm of anus, unspecified: Secondary | ICD-10-CM

## 2023-12-01 NOTE — Assessment & Plan Note (Signed)
[id number], stage IIIA -diagnosed in 09/2018, treated with concurrent chemoRT on 11/04/18 - 12/15/18.  -PET 04/14/19 showed complete radiographic response -last scope with Dr. Maisie Fus on 02/06/22 was benign. -surveillance CT A/P 04/26/22 showed NED. I do not plan for further scans. --she has completed 5 year surveillance

## 2023-12-02 ENCOUNTER — Encounter: Payer: Self-pay | Admitting: Hematology

## 2023-12-02 ENCOUNTER — Inpatient Hospital Stay (HOSPITAL_BASED_OUTPATIENT_CLINIC_OR_DEPARTMENT_OTHER): Payer: Medicare Other | Admitting: Hematology

## 2023-12-02 ENCOUNTER — Inpatient Hospital Stay: Payer: Medicare Other | Attending: Hematology

## 2023-12-02 VITALS — BP 138/69 | HR 67 | Temp 97.8°F | Resp 17 | Wt 159.3 lb

## 2023-12-02 DIAGNOSIS — Z96651 Presence of right artificial knee joint: Secondary | ICD-10-CM | POA: Diagnosis not present

## 2023-12-02 DIAGNOSIS — K649 Unspecified hemorrhoids: Secondary | ICD-10-CM | POA: Diagnosis not present

## 2023-12-02 DIAGNOSIS — Z08 Encounter for follow-up examination after completed treatment for malignant neoplasm: Secondary | ICD-10-CM | POA: Insufficient documentation

## 2023-12-02 DIAGNOSIS — C210 Malignant neoplasm of anus, unspecified: Secondary | ICD-10-CM

## 2023-12-02 DIAGNOSIS — Z85048 Personal history of other malignant neoplasm of rectum, rectosigmoid junction, and anus: Secondary | ICD-10-CM | POA: Insufficient documentation

## 2023-12-02 LAB — CBC WITH DIFFERENTIAL (CANCER CENTER ONLY)
Abs Immature Granulocytes: 0.01 10*3/uL (ref 0.00–0.07)
Basophils Absolute: 0 10*3/uL (ref 0.0–0.1)
Basophils Relative: 0 %
Eosinophils Absolute: 0.2 10*3/uL (ref 0.0–0.5)
Eosinophils Relative: 3 %
HCT: 37.7 % (ref 36.0–46.0)
Hemoglobin: 12.9 g/dL (ref 12.0–15.0)
Immature Granulocytes: 0 %
Lymphocytes Relative: 18 %
Lymphs Abs: 1 10*3/uL (ref 0.7–4.0)
MCH: 33.5 pg (ref 26.0–34.0)
MCHC: 34.2 g/dL (ref 30.0–36.0)
MCV: 97.9 fL (ref 80.0–100.0)
Monocytes Absolute: 0.6 10*3/uL (ref 0.1–1.0)
Monocytes Relative: 10 %
Neutro Abs: 4 10*3/uL (ref 1.7–7.7)
Neutrophils Relative %: 69 %
Platelet Count: 243 10*3/uL (ref 150–400)
RBC: 3.85 MIL/uL — ABNORMAL LOW (ref 3.87–5.11)
RDW: 13.2 % (ref 11.5–15.5)
WBC Count: 5.8 10*3/uL (ref 4.0–10.5)
nRBC: 0 % (ref 0.0–0.2)

## 2023-12-02 LAB — CMP (CANCER CENTER ONLY)
ALT: 43 U/L (ref 0–44)
AST: 29 U/L (ref 15–41)
Albumin: 4.2 g/dL (ref 3.5–5.0)
Alkaline Phosphatase: 114 U/L (ref 38–126)
Anion gap: 9 (ref 5–15)
BUN: 19 mg/dL (ref 8–23)
CO2: 24 mmol/L (ref 22–32)
Calcium: 9.4 mg/dL (ref 8.9–10.3)
Chloride: 108 mmol/L (ref 98–111)
Creatinine: 0.66 mg/dL (ref 0.44–1.00)
GFR, Estimated: >60 mL/min (ref >60)
Glucose, Bld: 151 mg/dL — ABNORMAL HIGH (ref 70–99)
Potassium: 3.4 mmol/L — ABNORMAL LOW (ref 3.5–5.1)
Sodium: 141 mmol/L (ref 135–145)
Total Bilirubin: 0.4 mg/dL (ref 0.0–1.2)
Total Protein: 7.1 g/dL (ref 6.5–8.1)

## 2023-12-02 NOTE — Progress Notes (Signed)
[facility]   [phone] [phone]   Clinic Follow up Note   Patient Care Team: Noberto Retort, MD as PCP - General (Family Medicine) [name], MD as PCP - Cardiology (Cardiology) Romie Levee, MD as Consulting Physician (General Surgery) Romualdo Bolk, MD (Inactive) as Consulting Physician (Obstetrics and Gynecology) Malachy Mood, MD as Consulting Physician (Hematology) [name], MD as Consulting Physician (Cardiology) Ollen Gross, MD as Consulting Physician (Orthopedic Surgery)  Date of Service:  12/02/2023  CHIEF COMPLAINT: f/u of anal cancer   CURRENT THERAPY:  Cancer surveillance  Oncology History   Anal cancer (HCC) cT2N1bM0, stage IIIA -diagnosed in 09/2018, treated with concurrent chemoRT on 11/04/18 - 12/15/18.  -PET 04/14/19 showed complete radiographic response -last scope with Dr. Maisie Fus on 02/06/22 was benign. -surveillance CT A/P 04/26/22 showed NED. I do not plan for further scans. --she has completed 5 year surveillance    Assessment and Plan    Anal Cancer Five-year follow-up for anal cancer. Recent scopes show no concerning findings. Rectal exam today was normal. No residual problems from treatment. Emphasized the importance of routine cancer screenings and monitoring for new symptoms. - Continue routine cancer screenings - Monitor for new symptoms and report if she occurs  Hemorrhoids Reported occasional hematochezia, attributed to hemorrhoids, with a single episode two weeks ago. Advised on dietary modifications to prevent constipation and straining. - Monitor for recurrent bleeding - Implement dietary modifications to prevent constipation and straining  Post-Knee Replacement Status Recently underwent knee replacement surgery. No longer taking gabapentin, oxycodone, tramadol, and meloxicam. Discussed removal of these medications from the medication list. - Remove gabapentin, oxycodone,  tramadol, and meloxicam from the medication list  General Health Maintenance Advised to continue routine cancer screenings and general health maintenance. - Continue routine cancer screenings - Monitor general health and report any new symptoms.      Plan -Patient is clinically doing well, no concern for recurrence.  She has completed 5 years of cancer surveillance. -I will see her as needed in future   SUMMARY OF ONCOLOGIC HISTORY: Oncology History  Anal cancer (HCC)  03/19/2018 Genetic Testing   Genetic testing performed through [facility]'s Common Hereditary Cancers Panel reported out on 03/10/2018 showed no pathogenic mutations. The Common Hereditary Cancer Panel offered by [facility] includes sequencing and/or deletion duplication testing of the following 47 genes: APC, ATM, AXIN2, BARD1, BMPR1A, BRCA1, BRCA2, BRIP1, CDH1, CDKN2A (p14ARF), CDKN2A (p16INK4a), CKD4, CHEK2, CTNNA1, DICER1, EPCAM (Deletion/duplication testing only), GREM1 (promoter region deletion/duplication testing only), KIT, MEN1, MLH1, MSH2, MSH3, MSH6, MUTYH, NBN, NF1, NHTL1, PALB2, PDGFRA, PMS2, POLD1, POLE, PTEN, RAD50, RAD51C, RAD51D, SDHB, SDHC, SDHD, SMAD4, SMARCA4. STK11, TP53, TSC1, TSC2, and VHL.  The following genes were evaluated for sequence changes only: SDHA and HOXB13 c.251G>A variant only.   A variant of uncertain significance (VUS) in a gene called PMS2 was also noted. c.1399G>A (p.Val467Ile)   08/14/2018 Imaging   MRI PELVIS W WO Contrast 08/14/18 IMPRESSION: 1. Centrally necrotic 1.8 cm enlarged lymph node versus nonspecific soft tissue mass in the left hemipelvis adjacent to the posterior pelvic sidewall. Further evaluation with biopsy and/or PET-CT is recommended.   09/17/2018 Pathology Results   Diagnosis 09/17/18 Lymph node, needle/core biopsy, left pelvic - SQUAMOUS CELL CARCINOMA. - SEE COMMENT. Microscopic Comment   10/09/2018 PET scan   PET 10/09/18  IMPRESSION: 1. 17 mm left pelvic  sidewall lesion is markedly hypermetabolic compatible with known malignancy. 2. No additional definite hypermetabolic disease identified  in the neck, chest, abdomen, or pelvis. 3. Relatively diffuse FDG accumulation in the colon without focal increased uptake above background colonic levels in the region of the anus. 4. Focal FDG accumulation identified along the anterior left femoral neck and in the lumbosacral junction. No underlying bone lesions evident at these locations on CT images. Indeterminate finding although metastatic disease cannot be excluded.    10/13/2018 Initial Biopsy   Diagnosis 10/13/18 Anus, biopsy - SQUAMOUS CELL CARCINOMA. - SEE COMMENT.   10/14/2018 Initial Diagnosis   Anal cancer (HCC)   10/14/2018 Cancer Staging   Staging form: Anus, AJCC 8th Edition - Clinical stage from 10/14/2018: Stage IIIA (cT2, cN1b, cM0) - Signed by Malachy Mood, MD on 10/14/2018   11/03/2018 - 12/15/2018 Radiation Therapy   Concurrent chemoRt with Dr. Mitzi Hansen for 5-6 weeks starting 11/03/18. Plans to complete on 12/15/18   11/03/2018 - 12/15/2018 Chemotherapy   Concurrent chemoRt with Mitomycin and 5-FU on week 1 and week 5 starting 11/03/18. Week 5 chemo on 12/01/18   04/14/2019 PET scan   PET 04/14/19  IMPRESSION: 1. The hypermetabolic 17 mm left pelvic sidewall lesion seen on the previous study has resolved in the interval. No new or progressive hypermetabolic disease in the neck, chest, abdomen, or pelvis on today's study. 2. Hepatic steatosis. 3. Colonic diverticulosis without diverticulitis.     04/14/2020 Imaging   CT CAP w contrast IMPRESSION: 1. Thickening and stranding about the anus, see PET-CT for further detail. No mass lesion grossly on this evaluation. 2. No evidence of metastatic disease in the chest, abdomen or pelvis. 3. Marked hepatic steatosis. Signs of recanalization of the umbilical vein, correlate with any clinical signs of liver disease. This is indicative  of portal hypertension but is seen in the absence of splenomegaly. Note that the portal vein is patent into the liver. 4. Recanalized umbilical vein with varix at the umbilicus. 5. Aortic atherosclerosis.   Aortic Atherosclerosis (ICD10-I70.0).   05/01/2021 Imaging   CT A/P w contrast  IMPRESSION: 1. No evidence of anal carcinoma recurrence or metastasis. 2. No abdominopelvic lymphadenopathy. 3. Scattered colonic diverticulosis without diverticulitis.      Discussed the use of AI scribe software for clinical note transcription with the patient, who gave verbal consent to proceed.  History of Present Illness   The patient, a 68 year old female with a history of anal cancer, presents for a routine follow-up. She reports overall well-being, with occasional stomach discomfort and diarrhea, which she attributes to certain foods. She also reports a single episode of blood in her stool approximately two weeks ago, which she believes was due to her known hemorrhoids. The patient recently underwent a knee replacement surgery and has since discontinued several of her pain medications. She denies any new symptoms or concerns related to her anal cancer.         All other systems were reviewed with the patient and are negative.  MEDICAL HISTORY:  Past Medical History:  Diagnosis Date   Anxiety    Aortic atherosclerosis (HCC)    Depression    Essential hypertension    GERD (gastroesophageal reflux disease)    History of kidney stones    Hyperlipidemia associated with type 2 diabetes mellitus (HCC)    Hyperparathyroidism (HCC)    resolved after surgery   Osteoarthritis    Hands, feet, knees   Osteoporosis    of the spine   Peripheral vascular disease (HCC)    Rectocele    Sensorineural hearing loss (SNHL)  of both ears    Squamous cell cancer, anus (HCC)    (Dr. Herold Harms -oncology) -radiation and chemo therapy   TMJ syndrome    Type 2 Diabetes (HCC) 11/2013   controlled with  diet/exercise    SURGICAL HISTORY: Past Surgical History:  Procedure Laterality Date   CT CTA CORONARY W/O CA SCORE W/CM &/OR WO/CM  11/07/2022   Coronary Calcium Score-16 (low risk).  Normal RCA dominance.  Minimal nonobstructive disease with minimal calcified plaque in LM and proximal LAD (0-24%).  Normal Ascending Aorta and PA.   LYMPH NODE BIOPSY     PARATHYROIDECTOMY  2000   THERAPEUTIC ABORTION     x 2   TOTAL KNEE ARTHROPLASTY Right 02/18/2023   Procedure: TOTAL KNEE ARTHROPLASTY;  Surgeon: Ollen Gross, MD;  Location: [facility];  Service: Orthopedics;  Laterality: Right;   TRANSTHORACIC ECHOCARDIOGRAM  11/22/2022   Normal LV size and function.  EF 65 to 70%.  No RWMA.  No LVH.  Normal diastolic parameters.  Normal RV as well as RVP and RAP.  Mild LA dilation otherwise normal RA.  Mild MAC.  Normal LV.   VULVA /PERINEUM BIOPSY  2012    I have reviewed the social history and family history with the patient and they are unchanged from previous note.  ALLERGIES:  is allergic to polymyxin b-trimethoprim and sulfa antibiotics.  MEDICATIONS:  Current Outpatient Medications  Medication Sig Dispense Refill   acetaminophen (TYLENOL) 500 MG tablet Take 1,000 mg by mouth every 6 (six) hours as needed for moderate pain or headache.     atenolol (TENORMIN) 100 MG tablet Take 100 mg by mouth at bedtime.     atorvastatin (LIPITOR) 20 MG tablet Take 20 mg by mouth at bedtime.     Blood Glucose Monitoring Suppl (ACCU-CHEK GUIDE) w/Device KIT daily.     clobetasol ointment (TEMOVATE) 0.05 % Apply a pea sized amount topically 1-2 x a week and  BID x 1-2 weeks prn flare 30 g 1   Estradiol 10 MCG TABS vaginal tablet Place 1 tablet (10 mcg total) vaginally 2 (two) times a week. 24 tablet 3   hydrochlorothiazide (HYDRODIURIL) 12.5 MG tablet Take 12.5 mg by mouth daily.      Lancets (ACCU-CHEK MULTICLIX) lancets See admin instructions.     meloxicam (MOBIC) 7.5 MG tablet Take 1 tablet (7.5 mg total)  by mouth daily as needed for pain. 14 tablet 0   valACYclovir (VALTREX) 500 MG tablet TAKE ONE TAB BY MOUTH TWICE A DAY FOR 3 DAYS WITH FLARES. 30 tablet 1   Current Facility-Administered Medications  Medication Dose Route Frequency Provider Last Rate Last Admin   denosumab (PROLIA) injection 60 mg  60 mg Subcutaneous Once Romualdo Bolk, MD        PHYSICAL EXAMINATION: ECOG PERFORMANCE STATUS: 0 - Asymptomatic  Vitals:   12/02/23 0812  BP: 138/69  Pulse: 67  Resp: 17  Temp: 97.8 F (36.6 C)  SpO2: 99%   Wt Readings from Last 3 Encounters:  12/02/23 159 lb 4.8 oz (72.3 kg)  02/18/23 164 lb (74.4 kg)  02/13/23 164 lb (74.4 kg)     GENERAL:alert, no distress and comfortable SKIN: skin color, texture, turgor are normal, no rashes or significant lesions EYES: normal, Conjunctiva are pink and non-injected, sclera clear NECK: supple, thyroid normal size, non-tender, without nodularity LYMPH:  no palpable lymphadenopathy in the cervical, axillary  LUNGS: clear to auscultation and percussion with normal breathing effort HEART: regular rate &  rhythm and no murmurs and no lower extremity edema ABDOMEN:abdomen soft, non-tender and normal bowel sounds.  Rectal exam was negative for mass or nodule.  She has external hemorrhoid. Musculoskeletal:no cyanosis of digits and no clubbing  NEURO: alert & oriented x 3 with fluent speech, no focal motor/sensory deficits    LABORATORY DATA:  I have reviewed the data as listed    Latest Ref Rng & Units 12/02/2023    7:54 AM 02/19/2023    3:25 AM 02/06/2023    8:32 AM  CBC  WBC 4.0 - 10.5 K/uL 5.8  8.4  5.5   Hemoglobin 12.0 - 15.0 g/dL 16.1  9.8  09.6   Hematocrit 36.0 - 46.0 % 37.7  28.9  40.3   Platelets 150 - 400 K/uL 243  209  256         Latest Ref Rng & Units 12/02/2023    7:54 AM 02/19/2023    3:25 AM 02/06/2023    8:32 AM  CMP  Glucose 70 - 99 mg/dL 045  409  811   BUN 8 - 23 mg/dL 19  15  22    Creatinine 0.44 - 1.00 mg/dL  9.14  7.82  9.56   Sodium 135 - 145 mmol/L 141  142  140   Potassium 3.5 - 5.1 mmol/L 3.4  3.4  4.2   Chloride 98 - 111 mmol/L 108  111  108   CO2 22 - 32 mmol/L 24  23  24    Calcium 8.9 - 10.3 mg/dL 9.4  7.7  9.4   Total Protein 6.5 - 8.1 g/dL 7.1     Total Bilirubin 0.0 - 1.2 mg/dL 0.4     Alkaline Phos 38 - 126 U/L 114     AST 15 - 41 U/L 29     ALT 0 - 44 U/L 43         RADIOGRAPHIC STUDIES: I have personally reviewed the radiological images as listed and agreed with the findings in the report. No results found.    No orders of the defined types were placed in this encounter.  All questions were answered. The patient knows to call the clinic with any problems, questions or concerns. No barriers to learning was detected. The total time spent in the appointment was 20 minutes.     Malachy Mood, MD 12/02/2023

## 2023-12-04 ENCOUNTER — Other Ambulatory Visit (HOSPITAL_COMMUNITY)
Admission: RE | Admit: 2023-12-04 | Discharge: 2023-12-04 | Disposition: A | Discharge status: Home / Self Care | Payer: Self-pay | Source: Ambulatory Visit | Attending: Medical Genetics | Admitting: Medical Genetics

## 2023-12-04 DIAGNOSIS — Z006 Encounter for examination for normal comparison and control in clinical research program: Secondary | ICD-10-CM | POA: Insufficient documentation

## 2023-12-16 ENCOUNTER — Other Ambulatory Visit: Payer: Self-pay | Admitting: Family Medicine

## 2023-12-16 DIAGNOSIS — M810 Age-related osteoporosis without current pathological fracture: Secondary | ICD-10-CM

## 2023-12-16 LAB — GENECONNECT MOLECULAR SCREEN: Genetic Analysis Overall Interpretation: NEGATIVE

## 2024-01-06 ENCOUNTER — Ambulatory Visit (HOSPITAL_COMMUNITY): Admit: 2024-01-06 | Payer: Medicare Other | Admitting: Orthopedic Surgery

## 2024-01-06 SURGERY — TOTAL KNEE ARTHROPLASTY
Anesthesia: Choice | Site: Knee | Laterality: Left

## 2024-02-06 ENCOUNTER — Other Ambulatory Visit: Payer: Self-pay | Admitting: Obstetrics and Gynecology

## 2024-02-06 ENCOUNTER — Other Ambulatory Visit (HOSPITAL_COMMUNITY)
Admission: RE | Admit: 2024-02-06 | Discharge: 2024-02-06 | Disposition: A | Discharge status: Home / Self Care | Source: Ambulatory Visit | Attending: Obstetrics and Gynecology | Admitting: Obstetrics and Gynecology

## 2024-02-06 DIAGNOSIS — Z01419 Encounter for gynecological examination (general) (routine) without abnormal findings: Secondary | ICD-10-CM | POA: Diagnosis present

## 2024-02-06 DIAGNOSIS — Z124 Encounter for screening for malignant neoplasm of cervix: Secondary | ICD-10-CM | POA: Diagnosis present

## 2024-02-06 DIAGNOSIS — Z1151 Encounter for screening for human papillomavirus (HPV): Secondary | ICD-10-CM | POA: Diagnosis present

## 2024-02-12 ENCOUNTER — Encounter (INDEPENDENT_AMBULATORY_CARE_PROVIDER_SITE_OTHER): Payer: Self-pay

## 2024-02-12 LAB — CYTOLOGY - PAP
Comment: NEGATIVE
High risk HPV: NEGATIVE

## 2024-02-21 ENCOUNTER — Ambulatory Visit: Payer: Medicare Other | Attending: Cardiology | Admitting: Cardiology

## 2024-02-21 ENCOUNTER — Encounter: Payer: Self-pay | Admitting: Cardiology

## 2024-02-21 VITALS — BP 116/76 | HR 62 | Ht 61.0 in | Wt 166.0 lb

## 2024-02-21 DIAGNOSIS — E1169 Type 2 diabetes mellitus with other specified complication: Secondary | ICD-10-CM | POA: Diagnosis present

## 2024-02-21 DIAGNOSIS — M79604 Pain in right leg: Secondary | ICD-10-CM | POA: Diagnosis present

## 2024-02-21 DIAGNOSIS — R002 Palpitations: Secondary | ICD-10-CM | POA: Diagnosis present

## 2024-02-21 DIAGNOSIS — M79605 Pain in left leg: Secondary | ICD-10-CM | POA: Diagnosis present

## 2024-02-21 DIAGNOSIS — E785 Hyperlipidemia, unspecified: Secondary | ICD-10-CM | POA: Insufficient documentation

## 2024-02-21 DIAGNOSIS — I10 Essential (primary) hypertension: Secondary | ICD-10-CM | POA: Diagnosis present

## 2024-02-21 DIAGNOSIS — I700 Atherosclerosis of aorta: Secondary | ICD-10-CM | POA: Diagnosis present

## 2024-02-21 NOTE — Assessment & Plan Note (Signed)
 Hyperlipidemia Cholesterol levels well-controlled. Discussed potential participation in Lp(a) clinical trial due to cardiovascular risk. - Continue current statin therapy. - Consider participation in Lp(a) clinical trial if levels are high.  Type 2 Diabetes Mellitus A1c is 6.4, indicating borderline diabetes. - Monitor A1c levels and consider medication if levels increase.

## 2024-02-21 NOTE — Assessment & Plan Note (Signed)
 Intermittent leg pain when sitting, relieved by walking, likely non-vascular. - Consider further evaluation if pain persists or worsens.

## 2024-02-21 NOTE — Assessment & Plan Note (Signed)
 Occasional palpitations, well-controlled with atenolol  100 mg  - Continue atenolol  therapy.

## 2024-02-21 NOTE — Progress Notes (Signed)
 Cardiology Office Note:  .   Date:  02/21/2024  ID:  DAVONA KINOSHITA, DOB Feb 20, 1956, MRN [id number] PCP: Roselind Congo, MD  [facility] Providers Cardiologist:  Randene Bustard, MD     Chief Complaint  Patient presents with   Follow-up    Doing well.  No major cardiac issues.    Patient Profile: .     Etsuko M Easterday is a 68 y.o. female  with a PMH notable for HTN, DM 2 and HLD who presents here for annual follow-up at the request of Roselind Congo, MD.    Barbra Boone Capili was last seen on 02/16/2023 follow-up evaluation to discuss her echocardiogram and Coronary CTA that are very reassuring.  This was as part of preop evaluation for right knee surgery.  Blood pressure was well-controlled on atenolol  100 mg daily along with HCTZ 12.5 mg daily.  Lipids are well-controlled on 20 mg atorvastatin .  She does notice some exercise intolerance related to inactivity with her knee pain.  Subjective  Discussed the use of AI scribe software for clinical note transcription with the patient, who gave verbal consent to proceed.  History of Present Illness History of Present Illness NEYSHA CRIADO is a 68 year old female who presents for follow-up regarding her cardiovascular health.  She notes occasional palpitations that last for short periods, usually half a day, but are generally controlled by atenolol . No dizziness, syncope, shortness of breath, or stroke-like symptoms. She sleeps on two pillows and does not wake up short of breath.   Cardiovascular ROS: no chest pain or dyspnea on exertion positive for - irregular heartbeat, palpitations, and somewhat deconditioned and therefore may be limited exercise intolerance but more limited by knee pain. negative for - edema, orthopnea, paroxysmal nocturnal dyspnea, rapid heart rate, shortness of breath, or lightheadedness or dizziness, syncope/near syncope or TIA/amaurosis fugax, claudication    She also complains of intermittent pain in  her leg, particularly in the upper part, which occurs mostly at night when sitting. Walking tends to alleviate the pain. She does not experience pain while walking.  Her current medications include atenolol  100 mg, atorvastatin , and hydrochlorothiazide  12.5 mg. She has added some vitamins to her regimen but does not regularly take meloxicam  or other pain medications, opting for Advil or Tylenol  only when necessary.  She has a history of diabetes for five years but is not on any diabetes medications. Her cholesterol levels were checked in September, showing a total cholesterol of 121, HDL of 39, LDL of 55, and triglycerides of 160. Her A1c was 6.4 in February, and her creatinine was 0.66 in January.  She takes aspirin once a day and has been diagnosed with osteoporosis and osteoarthritis. She has not yet started Prolia  injections for osteoporosis.  She has undergone one knee surgery, but the other was canceled twice. She is unable to kneel, which affects her ability to access items stored under beds at home. She has decided to delay the second knee surgery due to discomfort and inability to tolerate more procedures on her left knee.   Objective   Current Meds  Medication Sig   Alpha Lipoic Acid 200 MG CAPS Take 1 tablet by mouth daily.   ascorbic acid (VITAMIN C) 500 MG tablet Take 500 mg by mouth daily.   atenolol  (TENORMIN ) 100 MG tablet Take 100 mg by mouth at bedtime.   atorvastatin  (LIPITOR) 20 MG tablet Take 20 mg by mouth at bedtime.   Cholecalciferol (VITAMIN D3) 250  MCG (10000 UT) capsule Take 1,000 Units by mouth daily.   Estradiol  10 MCG TABS vaginal tablet Place 1 tablet (10 mcg total) vaginally 2 (two) times a week.   hydrochlorothiazide  (HYDRODIURIL ) 12.5 MG tablet Take 12.5 mg by mouth daily.    Multiple Vitamin (MULTIVITAMIN) tablet Take 1 tablet by mouth daily.   valACYclovir  (VALTREX ) 500 MG tablet TAKE ONE TAB BY MOUTH TWICE A DAY FOR 3 DAYS WITH FLARES.   ASA 81  mg   Studies Reviewed: Aaron Aas   EKG Interpretation Date/Time:  Friday February 21 2024 08:37:27 EDT Ventricular Rate:  62 PR Interval:  146 QRS Duration:  68 QT Interval:  448 QTC Calculation: 454 R Axis:   26  Text Interpretation: Sinus rhythm with Premature atrial complexes When compared with ECG of 07-Jul-1999 09:42, Premature atrial complexes are now Present Confirmed by Randene Bustard ([id number]) on 02/21/2024 8:43:45 AM    LABS Total cholesterol: 121 mg/dL (60/45/4098) HDL: 39 mg/dL (11/91/4782) LDL: 55 mg/dL (95/62/1308) Triglycerides: 160 mg/dL (65/78/4696) E9B: 2.8% (07/23/2023) A1c: 6.4% (12/2023) Creatinine: 0.66 mg/dL (41/3244)  Previous studies personally reviewed, summarized below Coronary CT 11/07/2022 : Coronary Calcium  Score-16 (low risk).  Normal RCA dominance.  Minimal nonobstructive disease with minimal calcified plaque in LM and proximal LAD (0-24%).  Normal Ascending Aorta and PA.  CAD-RADS 1. Minimal non-obstructive CAD (0-24%). Consider non-atherosclerotic causes of chest pain. Consider preventive therapy and risk factor modification.   Echo 11/22/2022: Normal LV size and function.  EF 65 to 70%.  No RWMA.  No LVH.  Normal diastolic parameters.  Normal RV as well as RVP and RAP.  Mild LA dilation otherwise normal RA.  Mild MAC.  Normal LV.   Risk Assessment/Calculations:            Physical Exam:   VS:  BP 116/76 (BP Location: Left Arm, Patient Position: Sitting)   Pulse 62   Ht 5\' 1"  (1.549 m)   Wt 166 lb (75.3 kg)   LMP 11/05/2002 (Approximate)   SpO2 98%   BMI 31.37 kg/m    Wt Readings from Last 3 Encounters:  02/21/24 166 lb (75.3 kg)  12/02/23 159 lb 4.8 oz (72.3 kg)  02/18/23 164 lb (74.4 kg)    GEN: Mildly obese but otherwise well nourished, well groomed in no acute distress; healthy-appearing. NECK: No JVD; No carotid bruits CARDIAC: Normal S1, S2; RRR, no murmurs, rubs, gallops RESPIRATORY:  Clear to auscultation without rales, wheezing or rhonchi ;  nonlabored, good air movement. ABDOMEN: Soft, non-tender, non-distended EXTREMITIES:  No edema; No deformity     ASSESSMENT AND PLAN: .    Problem List Items Addressed This Visit       Cardiology Problems   Aortic atherosclerosis (HCC) - Primary (Chronic)   Cardiovascular risk reduction with lipid/glycemic management and blood pressure control. On aspirin statin beta-blocker and HCTZ.  Reassuring Coronary Calcium  Score/Coronary CTA.      Essential hypertension (Chronic)   Blood pressure well-controlled on current medications: Atenolol  100 mg daily and HCTZ 12.5 mg daily. - Continue current antihypertensive medications.      Relevant Orders   EKG 12-Lead (Completed)   Hyperlipidemia due to type 2 diabetes mellitus (HCC) (Chronic)   Hyperlipidemia Cholesterol levels well-controlled. Discussed potential participation in Lp(a) clinical trial due to cardiovascular risk. - Continue current statin therapy. - Consider participation in Lp(a) clinical trial if levels are high.  Type 2 Diabetes Mellitus A1c is 6.4, indicating borderline diabetes. - Monitor A1c levels and consider  medication if levels increase.        Other   Bilateral leg pain (Chronic)   Intermittent leg pain when sitting, relieved by walking, likely non-vascular. - Consider further evaluation if pain persists or worsens.      Palpitations (Chronic)   Occasional palpitations, well-controlled with atenolol  100 mg  - Continue atenolol  therapy.         Follow-Up: Return in about 1 year (around 02/20/2025) for Alternate 1 yr follow-up with APP & MD. Prefers follow-up with cardiologist. Office relocating. - Schedule follow-up with APP in one year and with cardiologist in two years at new office location.     Signed, Arleen Lacer, MD, MS Randene Bustard, M.D., M.S. Interventional Cardiologist  [facility]  Pager # [phone] Phone # [phone] [address] [place], [place]  [address]

## 2024-02-21 NOTE — Patient Instructions (Signed)
 Medication Instructions:   No changes *If you need a refill on your cardiac medications before your next appointment, please call your pharmacy*   Lab Work: Not needed    Testing/Procedures:  Not needed  Follow-Up: At [facility], you and your health needs are our priority.  As part of our continuing mission to provide you with exceptional heart care, we have created designated Provider Care Teams.  These Care Teams include your primary Cardiologist (physician) and Advanced Practice Providers (APPs -  Physician Assistants and Nurse Practitioners) who all work together to provide you with the care you need, when you need it.     Your next appointment:   12 month(s)  The format for your next appointment:   In Person  Provider:   Aline Door, PA-C, Damien Braver, NP, or Katlyn West, NP    Then, Alm Clay, MD will plan to see you again in 24 month(s).   Other Instructions   s

## 2024-02-21 NOTE — Assessment & Plan Note (Signed)
 Blood pressure well-controlled on current medications: Atenolol  100 mg daily and HCTZ 12.5 mg daily. - Continue current antihypertensive medications.

## 2024-02-21 NOTE — Assessment & Plan Note (Addendum)
 Cardiovascular risk reduction with lipid/glycemic management and blood pressure control. On aspirin statin beta-blocker and HCTZ.  Reassuring Coronary Calcium  Score/Coronary CTA.

## 2024-06-08 ENCOUNTER — Other Ambulatory Visit: Payer: Self-pay | Admitting: Family Medicine

## 2024-06-08 DIAGNOSIS — Z1231 Encounter for screening mammogram for malignant neoplasm of breast: Secondary | ICD-10-CM

## 2024-07-20 ENCOUNTER — Ambulatory Visit
Admission: RE | Admit: 2024-07-20 | Discharge: 2024-07-20 | Disposition: A | Discharge status: Home / Self Care | Source: Ambulatory Visit | Attending: Family Medicine | Admitting: Family Medicine

## 2024-07-20 DIAGNOSIS — Z1231 Encounter for screening mammogram for malignant neoplasm of breast: Secondary | ICD-10-CM

## 2024-07-23 NOTE — Progress Notes (Signed)
 Update: PMS2 [id number] ([id number]) VUS reclassified to benign. Amended report date is 06/11/2024.

## 2024-10-07 ENCOUNTER — Ambulatory Visit: Admitting: Obstetrics and Gynecology

## 2024-10-07 ENCOUNTER — Encounter: Payer: Self-pay | Admitting: Obstetrics and Gynecology

## 2024-10-07 VITALS — BP 120/64 | HR 77 | Temp 98.0°F | Ht 62.0 in | Wt 166.0 lb

## 2024-10-07 DIAGNOSIS — L900 Lichen sclerosus et atrophicus: Secondary | ICD-10-CM | POA: Diagnosis not present

## 2024-10-07 DIAGNOSIS — R87618 Other abnormal cytological findings on specimens from cervix uteri: Secondary | ICD-10-CM | POA: Diagnosis not present

## 2024-10-07 DIAGNOSIS — N952 Postmenopausal atrophic vaginitis: Secondary | ICD-10-CM

## 2024-10-07 MED ORDER — CLOBETASOL PROPIONATE 0.05 % EX OINT: TOPICAL_OINTMENT | CUTANEOUS | 3 refills | Status: AC

## 2024-10-07 MED ORDER — ESTRADIOL 10 MCG VA TABS: 1.0000 | ORAL_TABLET | VAGINAL | 4 refills | Status: AC

## 2024-10-07 NOTE — Patient Instructions (Signed)
 Take 600mg  ibuprofen  30 minutes prior to your appointment.

## 2024-10-07 NOTE — Progress Notes (Signed)
 68 y.o. [id number] female with hx of anal cancer (RT in 7979), GSM, lichen sclerosus, genital  HSV, osteoporosis here for abnormal PAP discussion. Married. Retired.  Patient's last menstrual period was 11/05/2002 (approximate).   She reports open areas near anal fold x1-2wk. Sexually active: no due to anal RT      Component Value Date/Time   DIAGPAP - Atrophic pattern with epithelial atypia (A) 02/06/2024 0000   DIAGPAP  12/22/2020 1002    - Negative for intraepithelial lesion or malignancy (NILM)   DIAGPAP  01/16/2018 0000    NEGATIVE FOR INTRAEPITHELIAL LESIONS OR MALIGNANCY.   HPVHIGH Negative 02/06/2024 0000   HPVHIGH Negative 12/22/2020 1002   ADEQPAP Satisfactory for evaluation. 02/06/2024 0000   ADEQPAP  12/22/2020 1002    Satisfactory for evaluation; transformation zone component PRESENT.   ADEQPAP  01/16/2018 0000    Satisfactory for evaluation  endocervical/transformation zone component PRESENT.    OB History  Gravida Para Term Preterm AB Living  4 2 2  0 2 2  SAB IAB Ectopic Multiple Live Births  0 2 0 0 2    # Outcome Date GA Lbr Len/2nd Weight Sex Type Anes PTL Lv  4 Term 57    M Vag-Spont   LIV  3 Term 68    M Vag-Spont   LIV  2 IAB           1 IAB            Past Medical History:  Diagnosis Date   Anxiety    Aortic atherosclerosis    Depression    Essential hypertension    GERD (gastroesophageal reflux disease)    History of kidney stones    Hyperlipidemia associated with type 2 diabetes mellitus (HCC)    Hyperparathyroidism    resolved after surgery   Osteoarthritis    Hands, feet, knees   Osteoporosis    of the spine   Peripheral vascular disease    Rectocele    Sensorineural hearing loss (SNHL) of both ears    Squamous cell cancer, anus (HCC)    (Dr. Laren -oncology) -radiation and chemo therapy   TMJ syndrome    Type 2 Diabetes (HCC) 11/2013   controlled with diet/exercise   Past Surgical History:  Procedure Laterality Date   CT CTA  CORONARY W/O CA SCORE W/CM &/OR WO/CM  11/07/2022   Coronary Calcium  Score-16 (low risk).  Normal RCA dominance.  Minimal nonobstructive disease with minimal calcified plaque in LM and proximal LAD (0-24%).  Normal Ascending Aorta and PA.   LYMPH NODE BIOPSY     PARATHYROIDECTOMY  2000   THERAPEUTIC ABORTION     x 2   TOTAL KNEE ARTHROPLASTY Right 02/18/2023   Procedure: TOTAL KNEE ARTHROPLASTY;  Surgeon: Melodi Lerner, MD;  Location: [facility];  Service: Orthopedics;  Laterality: Right;   TRANSTHORACIC ECHOCARDIOGRAM  11/22/2022   Normal LV size and function.  EF 65 to 70%.  No RWMA.  No LVH.  Normal diastolic parameters.  Normal RV as well as RVP and RAP.  Mild LA dilation otherwise normal RA.  Mild MAC.  Normal LV.   VULVA /PERINEUM BIOPSY  2012   Current Outpatient Medications on File Prior to Visit  Medication Sig Dispense Refill   Alpha Lipoic Acid 200 MG CAPS Take 1 tablet by mouth daily.     amoxicillin (AMOXIL) 500 MG capsule TAKE 4 CAPSULES BY MOUTH ONE HOUR PRIOR TO DENTAL PROCEDURE     ascorbic acid (  VITAMIN C) 500 MG tablet Take 500 mg by mouth daily.     aspirin EC 81 MG tablet 1 tablet Orally Once a day     atenolol  (TENORMIN ) 100 MG tablet Take 100 mg by mouth at bedtime.     atorvastatin  (LIPITOR) 20 MG tablet Take 20 mg by mouth at bedtime.     Blood Glucose Monitoring Suppl (ACCU-CHEK GUIDE) w/Device KIT daily.     Cholecalciferol (VITAMIN D3) 250 MCG (10000 UT) capsule Take 1,000 Units by mouth daily.     halobetasol (ULTRAVATE) 0.05 % cream APPLY A SMALL AMOUNT ONCE A DAY TO RASH ON LEG AND ELBOW AFTER SHOWERING UNTIL CLEAR AS NEEDED     hydrochlorothiazide  (HYDRODIURIL ) 12.5 MG tablet Take 12.5 mg by mouth daily.      Lancets (ACCU-CHEK MULTICLIX) lancets See admin instructions.     Magnesium 200 MG TABS 2 tablets with a meal Orally Once a day     Multiple Vitamin (MULTIVITAMIN) tablet Take 1 tablet by mouth daily.     valACYclovir  (VALTREX ) 500 MG tablet TAKE ONE TAB BY  MOUTH TWICE A DAY FOR 3 DAYS WITH FLARES. 30 tablet 1   Current Facility-Administered Medications on File Prior to Visit  Medication Dose Route Frequency Provider Last Rate Last Admin   denosumab  (PROLIA ) injection 60 mg  60 mg Subcutaneous Once Jertson, Jill Evelyn, MD       Allergies  Allergen Reactions   Polymyxin B-Trimethoprim Itching   Sulfa Antibiotics Nausea And Vomiting      PE Today's Vitals   10/07/24 1453  BP: 120/64  Pulse: 77  Temp: 98 F (36.7 C)  TempSrc: Oral  SpO2: 96%  Weight: 166 lb (75.3 kg)  Height: 5' 2 (1.575 m)   Body mass index is 30.36 kg/m.  Physical Exam Vitals reviewed. Exam conducted with a chaperone present.  Constitutional:      General: She is not in acute distress.    Appearance: Normal appearance.  HENT:     Head: Normocephalic and atraumatic.     Nose: Nose normal.  Eyes:     Extraocular Movements: Extraocular movements intact.     Conjunctiva/sclera: Conjunctivae normal.  Pulmonary:     Effort: Pulmonary effort is normal.  Genitourinary:    General: Normal vulva.     Exam position: Lithotomy position.      Comments: Deferred internal exam due to vaginal atrophy from prior radiation +fissue of upper anal fold Musculoskeletal:        General: Normal range of motion.     Cervical back: Normal range of motion.  Neurological:     General: No focal deficit present.     Mental Status: She is alert.  Psychiatric:        Mood and Affect: Mood normal.        Behavior: Behavior normal.      Assessment and Plan:        Other abnormal cytological finding of specimen from cervix -     Colposcopy; Future Abnormal PAP results reviewed. Recommend colposcopy given abnormality and lack of guidelines for management of pathology report Patient in agreement with plan Start vaginal estrogen twice weekly for GSM and daily clobetasol  for LS RTO in 2wk for procedure  Lichen sclerosus -     Clobetasol  Propionate; Apply a pea sized  amount topically twice a week. With flares: May apply to skin daily for 1-2 weeks as needed.  Dispense: 60 g; Refill: 3  Vaginal atrophy -  Estradiol ; Place 1 tablet (10 mcg total) vaginally 2 (two) times a week.  Dispense: 24 tablet; Refill: 4    Vera LULLA Pa, MD

## 2024-11-04 ENCOUNTER — Encounter: Admitting: Obstetrics and Gynecology

## 2024-11-09 ENCOUNTER — Ambulatory Visit (INDEPENDENT_AMBULATORY_CARE_PROVIDER_SITE_OTHER): Admitting: Obstetrics and Gynecology

## 2024-11-09 ENCOUNTER — Other Ambulatory Visit (HOSPITAL_COMMUNITY)
Admission: RE | Admit: 2024-11-09 | Discharge: 2024-11-09 | Disposition: A | Discharge status: Home / Self Care | Source: Ambulatory Visit | Attending: Obstetrics and Gynecology | Admitting: Obstetrics and Gynecology

## 2024-11-09 ENCOUNTER — Encounter: Payer: Self-pay | Admitting: Obstetrics and Gynecology

## 2024-11-09 VITALS — BP 118/64 | HR 72 | Temp 98.2°F | Wt 164.0 lb

## 2024-11-09 DIAGNOSIS — Z01812 Encounter for preprocedural laboratory examination: Secondary | ICD-10-CM

## 2024-11-09 DIAGNOSIS — R87618 Other abnormal cytological findings on specimens from cervix uteri: Secondary | ICD-10-CM | POA: Diagnosis present

## 2024-11-09 LAB — PREGNANCY, URINE: Preg Test, Ur: NEGATIVE

## 2024-11-09 NOTE — Patient Instructions (Signed)
 It is common to have vaginal bleeding and cramping for up to 72 hours after your biopsy. Please call our office with heavy vaginal bleeding, severe abdominal pain or fever. Avoid intercourse, tampon use, douching and baths for 7 days to decrease the risk of infection.

## 2024-11-09 NOTE — Progress Notes (Signed)
 "  69 y.o. [id number] female with hx of anal cancer (RT in 7979), GSM, lichen sclerosus, genital  HSV, osteoporosis  here for colposcopy. Married.  Retired.  Patient's last menstrual period was 11/05/2002.   Last PAP:    Component Value Date/Time   DIAGPAP - Atrophic pattern with epithelial atypia (A) 02/06/2024 0000   DIAGPAP  12/22/2020 1002    - Negative for intraepithelial lesion or malignancy (NILM)   DIAGPAP  01/16/2018 0000    NEGATIVE FOR INTRAEPITHELIAL LESIONS OR MALIGNANCY.   HPVHIGH Negative 02/06/2024 0000   HPVHIGH Negative 12/22/2020 1002   ADEQPAP Satisfactory for evaluation. 02/06/2024 0000   ADEQPAP  12/22/2020 1002    Satisfactory for evaluation; transformation zone component PRESENT.   ADEQPAP  01/16/2018 0000    Satisfactory for evaluation  endocervical/transformation zone component PRESENT.   OB History  Gravida Para Term Preterm AB Living  4 2 2  0 2 2  SAB IAB Ectopic Multiple Live Births  0 2 0 0 2    # Outcome Date GA Lbr Len/2nd Weight Sex Type Anes PTL Lv  4 Term 38    M Vag-Spont   LIV  3 Term 23    M Vag-Spont   LIV  2 IAB           1 IAB             Past Medical History:  Diagnosis Date   Anxiety    Aortic atherosclerosis    Depression    Essential hypertension    GERD (gastroesophageal reflux disease)    History of kidney stones    Hyperlipidemia associated with type 2 diabetes mellitus (HCC)    Hyperparathyroidism    resolved after surgery   Osteoarthritis    Hands, feet, knees   Osteoporosis    of the spine   Peripheral vascular disease    Rectocele    Sensorineural hearing loss (SNHL) of both ears    Squamous cell cancer, anus (HCC)    (Dr. Laren -oncology) -radiation and chemo therapy   TMJ syndrome    Type 2 Diabetes (HCC) 11/2013   controlled with diet/exercise    Past Surgical History:  Procedure Laterality Date   CT CTA CORONARY W/O CA SCORE W/CM &/OR WO/CM  11/07/2022   Coronary Calcium  Score-16 (low risk).   Normal RCA dominance.  Minimal nonobstructive disease with minimal calcified plaque in LM and proximal LAD (0-24%).  Normal Ascending Aorta and PA.   LYMPH NODE BIOPSY     PARATHYROIDECTOMY  2000   THERAPEUTIC ABORTION     x 2   TOTAL KNEE ARTHROPLASTY Right 02/18/2023   Procedure: TOTAL KNEE ARTHROPLASTY;  Surgeon: Melodi Lerner, MD;  Location: [facility];  Service: Orthopedics;  Laterality: Right;   TRANSTHORACIC ECHOCARDIOGRAM  11/22/2022   Normal LV size and function.  EF 65 to 70%.  No RWMA.  No LVH.  Normal diastolic parameters.  Normal RV as well as RVP and RAP.  Mild LA dilation otherwise normal RA.  Mild MAC.  Normal LV.   VULVA /PERINEUM BIOPSY  2012    Medications Ordered Prior to Encounter[1]  Allergies[2]    PE Today's Vitals   11/09/24 0833  BP: 118/64  Pulse: 72  Temp: 98.2 F (36.8 C)  TempSrc: Oral  SpO2: 98%  Weight: 164 lb (74.4 kg)   Body mass index is 30 kg/m.  Physical Exam Vitals reviewed. Exam conducted with a chaperone present.  Constitutional:  General: She is not in acute distress.    Appearance: Normal appearance.  HENT:     Head: Normocephalic and atraumatic.     Nose: Nose normal.  Eyes:     Extraocular Movements: Extraocular movements intact.     Conjunctiva/sclera: Conjunctivae normal.  Pulmonary:     Effort: Pulmonary effort is normal.  Genitourinary:    Exam position: Lithotomy position.     Vagina: Prolapsed vaginal walls (Grade 3 rectocele) present. No vaginal discharge.     Cervix: Normal. No cervical motion tenderness, discharge or lesion.      Comments: Significant vaginal atrophy Loss of cervico-vaginal fold Scar tissue noted vaginally Fissure of upper anal fold, resolved Musculoskeletal:        General: Normal range of motion.     Cervical back: Normal range of motion.  Neurological:     General: No focal deficit present.     Mental Status: She is alert.  Psychiatric:        Mood and Affect: Mood normal.         Behavior: Behavior normal.      Colposcopy Procedure Consented for procedure.  Time out performed. Speculum placed in vagina.  Acetic acid 3% was applied to cervix.  Unsatisfactory colposcopy. TZ was not seen. Hurricaine anesthetic spray was applied. Biopsies taken Yes.  Random. Location(s) - Anterior cervical region  Findings: None ECC was not performed. Specimens to pathology separately.  Monsel's applied to biopsy areas.   Good hemostasis.  Minimal EBL. No complications.  Tolerated well.     Assessment and Plan:        Pre-procedure lab exam -     Pregnancy, urine  Other abnormal cytological finding of specimen from cervix -     Colposcopy   Abnormal PAP results reviewed. ASCCP guidelines reviewed. Consents signed. Unsatisfactory colposcopy, random biopsy performed. Repeat PAP due April 2026. Aftercare instructions provided.  Will call with results. I also recommend completion of Gardasil vaccine up to age 68yo, avoidance of smoking, and use of condoms with new sexual partners to prevent progression of disease.   Vera LULLA Pa, MD     [1]  Current Outpatient Medications on File Prior to Visit  Medication Sig Dispense Refill   Alpha Lipoic Acid 200 MG CAPS Take 1 tablet by mouth daily.     amoxicillin (AMOXIL) 500 MG capsule TAKE 4 CAPSULES BY MOUTH ONE HOUR PRIOR TO DENTAL PROCEDURE     ascorbic acid (VITAMIN C) 500 MG tablet Take 500 mg by mouth daily.     aspirin EC 81 MG tablet 1 tablet Orally Once a day     atenolol  (TENORMIN ) 100 MG tablet Take 100 mg by mouth at bedtime.     atorvastatin  (LIPITOR) 20 MG tablet Take 20 mg by mouth at bedtime.     Blood Glucose Monitoring Suppl (ACCU-CHEK GUIDE) w/Device KIT daily.     Cholecalciferol (VITAMIN D3) 250 MCG (10000 UT) capsule Take 1,000 Units by mouth daily.     clobetasol  ointment (TEMOVATE ) 0.05 % Apply a pea sized amount topically twice a week. With flares: May apply to skin daily for 1-2 weeks as  needed. 60 g 3   Estradiol  10 MCG TABS vaginal tablet Place 1 tablet (10 mcg total) vaginally 2 (two) times a week. 24 tablet 4   halobetasol (ULTRAVATE) 0.05 % cream APPLY A SMALL AMOUNT ONCE A DAY TO RASH ON LEG AND ELBOW AFTER SHOWERING UNTIL CLEAR AS NEEDED  hydrochlorothiazide  (HYDRODIURIL ) 12.5 MG tablet Take 12.5 mg by mouth daily.      Lancets (ACCU-CHEK MULTICLIX) lancets See admin instructions.     Magnesium 200 MG TABS 2 tablets with a meal Orally Once a day     Multiple Vitamin (MULTIVITAMIN) tablet Take 1 tablet by mouth daily.     valACYclovir  (VALTREX ) 500 MG tablet TAKE ONE TAB BY MOUTH TWICE A DAY FOR 3 DAYS WITH FLARES. 30 tablet 1   Current Facility-Administered Medications on File Prior to Visit  Medication Dose Route Frequency Provider Last Rate Last Admin   denosumab  (PROLIA ) injection 60 mg  60 mg Subcutaneous Once Jertson, Jill Evelyn, MD      [2]  Allergies Allergen Reactions   Polymyxin B-Trimethoprim Itching   Sulfa Antibiotics Nausea And Vomiting   "

## 2024-11-11 ENCOUNTER — Ambulatory Visit: Payer: Self-pay | Admitting: Obstetrics and Gynecology

## 2024-11-11 LAB — SURGICAL PATHOLOGY

## 2024-11-24 ENCOUNTER — Ambulatory Visit: Payer: Medicare Other | Admitting: Podiatry

## 2024-12-03 ENCOUNTER — Ambulatory Visit (INDEPENDENT_AMBULATORY_CARE_PROVIDER_SITE_OTHER)

## 2024-12-03 ENCOUNTER — Ambulatory Visit (INDEPENDENT_AMBULATORY_CARE_PROVIDER_SITE_OTHER): Admitting: Podiatry

## 2024-12-03 DIAGNOSIS — M7752 Other enthesopathy of left foot: Secondary | ICD-10-CM

## 2024-12-03 DIAGNOSIS — M778 Other enthesopathies, not elsewhere classified: Secondary | ICD-10-CM | POA: Diagnosis not present

## 2024-12-03 DIAGNOSIS — Z0189 Encounter for other specified special examinations: Secondary | ICD-10-CM | POA: Diagnosis not present

## 2024-12-03 DIAGNOSIS — M7751 Other enthesopathy of right foot: Secondary | ICD-10-CM

## 2024-12-03 DIAGNOSIS — E119 Type 2 diabetes mellitus without complications: Secondary | ICD-10-CM

## 2024-12-03 NOTE — Patient Instructions (Signed)
 Diabetes and Foot Care: What to Know Diabetes, also called diabetes mellitus, may cause problems with your feet and legs because of poor blood flow (circulation). Poor circulation may make your skin: Become thinner and drier. Break more easily. Heal more slowly. Peel and crack. You may also have nerve damage (neuropathy). This can cause decreased feeling in your legs and feet. This means that you may not notice minor injuries to your feet that could lead to more serious problems. Finding and treating problems early is the best way to prevent future foot problems. How to care for your feet Foot hygiene  Wash your feet daily with warm water  and mild soap. Do not use hot water . Then, pat your feet and the areas between your toes until they are fully dry. Do not soak your feet. This can dry your skin. Trim your toenails straight across. Do not dig under them or around the cuticle. File the edges of your nails with an emery board or nail file. Apply a moisturizing lotion or petroleum jelly to the skin on your feet and to dry, brittle toenails. Use lotion that does not contain alcohol and is unscented. Do not apply lotion between your toes. Shoes and socks Wear clean socks or stockings every day. Make sure they are not too tight. Do not wear knee-high stockings. These may decrease blood flow to your legs. Wear shoes that fit well and have enough cushioning. Always look in your shoes before you put them on to be sure there are no objects inside. To break in new shoes, wear them for just a few hours a day. This prevents injuries on your feet. Wounds, scrapes, corns, and calluses  Check your feet daily for blisters, cuts, bruises, sores, and redness. If you cannot see the bottom of your feet, use a mirror or ask someone for help. Do not cut off corns or calluses or try to remove them with medicine. If you find a minor scrape, cut, or break in the skin on your feet, keep it and the skin around it clean  and dry. You may clean these areas with mild soap and water . Do not clean the area with peroxide, alcohol, or iodine . If you have a wound, scrape, corn, or callus on your foot, look at it several times a day to make sure it is healing and not infected. Check for: Redness, swelling, or pain. Fluid or blood. Warmth. Pus or a bad smell. General tips Do not cross your legs. This may decrease blood flow to your feet. Do not use heating pads or hot water  bottles on your feet. They may burn your skin. If you have lost feeling in your feet or legs, you may not know this is happening until it is too late. Protect your feet from hot and cold by wearing shoes, such as at the beach or on hot pavement. Schedule a complete foot exam at least once a year or more often if you have foot problems. Report any cuts, sores, or bruises to your health care provider right away. Where to find more information American Diabetes Association: diabetes.org Association of Diabetes [employer] & Education Specialists: diabeteseducator.org Contact a health care provider if: You have a condition that increases your risk of infection, and you have any cuts, sores, or bruises on your feet. You have an injury that is not healing. You have redness on your legs or feet. You feel burning or tingling in your legs or feet. You have pain or cramps in  your legs and feet. Your legs or feet are numb. Your feet always feel cold. You have pain around any toenails. Get help right away if: You have a wound, scrape, corn, or callus on your foot and: You have signs of infection. You have a fever. You have a red line going up your leg. This information is not intended to replace advice given to you by your health care provider. Make sure you discuss any questions you have with your health care provider. Document Revised: 08/28/2024 Document Reviewed: 04/25/2022 Elsevier Patient Education  2025 [employer].

## 2024-12-05 NOTE — Progress Notes (Signed)
 Subjective: Chief Complaint  Patient presents with   [facility]    Last A1c: 7.0, takes asa 81 mg.     69 year old female presents the office with above concerns.  She presents today for diabetic foot exam.  She does not report any ulcerations or any injuries to her feet.    She does report that she gets pain to her feet but she states that shoots down pointing from her hip down to her foot and stops around the midfoot area.  The left side appears to be worse than the right.  No recent injuries.  Objective: AAO x3, NAD DP/PT pulses palpable bilaterally, CRT less than 3 seconds Sensation intact with Semmes Weinstein monofilament. No open lesions. Unable to appreciate any area of pinpoint tenderness.  She does severely get some discomfort along the Lisfranc joint, mid foot area with no specific area pinpoint tenderness.  No edema, erythema.  Negative Allen sign. No pain with calf compression, swelling, warmth, erythema  Assessment: Diabetic foot exam, neuritis  Plan: -All treatment options discussed with the patient including all alternatives, risks, complications.  -X-rays obtained reviewed.  Multiple views obtained.  There is some narrowing along the Lisfranc joint mostly on the right side worse than the left.  There is no specific area pinpoint tenderness. -I do think the symptoms are coming more from her hip or back as it is shooting down the leg.  She is going see her PCP coming up.  From a foot standpoint discussed supportive shoe.  She can use Voltaren gel if needed. - From a diabetes standpoint her feet are doing well.  Discussed that if inspection with glucose control.  Return in about 1 year (around 12/03/2025).  Donnice JONELLE Fees DPM

## 2024-12-11 ENCOUNTER — Encounter: Payer: Self-pay | Admitting: Podiatry

## 2025-01-11 ENCOUNTER — Ambulatory Visit: Admitting: Obstetrics and Gynecology

## 2025-01-12 ENCOUNTER — Ambulatory Visit: Admitting: Obstetrics and Gynecology

## 2025-12-03 ENCOUNTER — Ambulatory Visit: Admitting: Podiatry
